# Patient Record
Sex: Female | Born: 1940 | Race: White | Hispanic: No | Smoking: Former smoker
Health system: Southern US, Community
[De-identification: ages and names within clinical notes are randomized; demographics above are authoritative.]

## PROBLEM LIST (undated history)

## (undated) DIAGNOSIS — H9193 Unspecified hearing loss, bilateral: Secondary | ICD-10-CM

## (undated) DIAGNOSIS — K2970 Gastritis, unspecified, without bleeding: Secondary | ICD-10-CM

## (undated) DIAGNOSIS — R112 Nausea with vomiting, unspecified: Secondary | ICD-10-CM

## (undated) DIAGNOSIS — N879 Dysplasia of cervix uteri, unspecified: Secondary | ICD-10-CM

## (undated) DIAGNOSIS — G251 Drug-induced tremor: Secondary | ICD-10-CM

## (undated) DIAGNOSIS — R251 Tremor, unspecified: Secondary | ICD-10-CM

## (undated) DIAGNOSIS — B9681 Helicobacter pylori [H. pylori] as the cause of diseases classified elsewhere: Secondary | ICD-10-CM

## (undated) DIAGNOSIS — E039 Hypothyroidism, unspecified: Secondary | ICD-10-CM

## (undated) DIAGNOSIS — I951 Orthostatic hypotension: Secondary | ICD-10-CM

## (undated) DIAGNOSIS — G232 Striatonigral degeneration: Secondary | ICD-10-CM

## (undated) DIAGNOSIS — M545 Low back pain, unspecified: Secondary | ICD-10-CM

## (undated) DIAGNOSIS — M1990 Unspecified osteoarthritis, unspecified site: Secondary | ICD-10-CM

## (undated) DIAGNOSIS — J329 Chronic sinusitis, unspecified: Secondary | ICD-10-CM

## (undated) DIAGNOSIS — M8580 Other specified disorders of bone density and structure, unspecified site: Secondary | ICD-10-CM

## (undated) DIAGNOSIS — I4891 Unspecified atrial fibrillation: Secondary | ICD-10-CM

## (undated) DIAGNOSIS — F419 Anxiety disorder, unspecified: Secondary | ICD-10-CM

## (undated) DIAGNOSIS — T43505A Adverse effect of unspecified antipsychotics and neuroleptics, initial encounter: Principal | ICD-10-CM

## (undated) DIAGNOSIS — G903 Multi-system degeneration of the autonomic nervous system: Secondary | ICD-10-CM

## (undated) DIAGNOSIS — S2249XA Multiple fractures of ribs, unspecified side, initial encounter for closed fracture: Secondary | ICD-10-CM

## (undated) DIAGNOSIS — S2239XA Fracture of one rib, unspecified side, initial encounter for closed fracture: Secondary | ICD-10-CM

## (undated) DIAGNOSIS — I499 Cardiac arrhythmia, unspecified: Secondary | ICD-10-CM

## (undated) DIAGNOSIS — C801 Malignant (primary) neoplasm, unspecified: Secondary | ICD-10-CM

## (undated) DIAGNOSIS — H9190 Unspecified hearing loss, unspecified ear: Secondary | ICD-10-CM

## (undated) DIAGNOSIS — Z9889 Other specified postprocedural states: Secondary | ICD-10-CM

## (undated) DIAGNOSIS — N83209 Unspecified ovarian cyst, unspecified side: Secondary | ICD-10-CM

## (undated) DIAGNOSIS — F32A Depression, unspecified: Secondary | ICD-10-CM

## (undated) DIAGNOSIS — J309 Allergic rhinitis, unspecified: Secondary | ICD-10-CM

## (undated) DIAGNOSIS — M7741 Metatarsalgia, right foot: Secondary | ICD-10-CM

## (undated) DIAGNOSIS — G238 Other specified degenerative diseases of basal ganglia: Secondary | ICD-10-CM

## (undated) DIAGNOSIS — E079 Disorder of thyroid, unspecified: Secondary | ICD-10-CM

## (undated) DIAGNOSIS — F329 Major depressive disorder, single episode, unspecified: Secondary | ICD-10-CM

## (undated) HISTORY — DX: Multiple fractures of ribs, unspecified side, initial encounter for closed fracture: S22.49XA

## (undated) HISTORY — PX: BACK SURGERY: SHX140

## (undated) HISTORY — DX: Dysplasia of cervix uteri, unspecified: N87.9

## (undated) HISTORY — DX: Drug-induced tremor: G25.1

## (undated) HISTORY — PX: BREAST ENHANCEMENT SURGERY: SHX7

## (undated) HISTORY — DX: Orthostatic hypotension: I95.1

## (undated) HISTORY — DX: Adverse effect of unspecified antipsychotics and neuroleptics, initial encounter: T43.505A

## (undated) HISTORY — PX: TOE SURGERY: SHX1073

## (undated) HISTORY — PX: HAMMER TOE SURGERY: SHX385

## (undated) HISTORY — DX: Other specified disorders of bone density and structure, unspecified site: M85.80

## (undated) HISTORY — DX: Metatarsalgia, right foot: M77.41

## (undated) HISTORY — PX: HERNIA REPAIR: SHX51

## (undated) HISTORY — DX: Unspecified ovarian cyst, unspecified side: N83.209

## (undated) HISTORY — DX: Other specified degenerative diseases of basal ganglia: G23.8

## (undated) HISTORY — PX: ABDOMINAL HYSTERECTOMY: SHX81

## (undated) HISTORY — DX: Unspecified hearing loss, bilateral: H91.93

## (undated) HISTORY — DX: Multi-system degeneration of the autonomic nervous system: G90.3

## (undated) HISTORY — DX: Unspecified atrial fibrillation: I48.91

## (undated) HISTORY — DX: Allergic rhinitis, unspecified: J30.9

## (undated) HISTORY — DX: Gastritis, unspecified, without bleeding: K29.70

## (undated) HISTORY — PX: GANGLION CYST EXCISION: SHX1691

## (undated) HISTORY — PX: OTHER SURGICAL HISTORY: SHX169

## (undated) HISTORY — DX: Low back pain: M54.5

## (undated) HISTORY — DX: Low back pain, unspecified: M54.50

## (undated) HISTORY — DX: Striatonigral degeneration: G23.2

## (undated) HISTORY — DX: Helicobacter pylori (H. pylori) as the cause of diseases classified elsewhere: B96.81

## (undated) HISTORY — DX: Fracture of one rib, unspecified side, initial encounter for closed fracture: S22.39XA

## (undated) HISTORY — DX: Disorder of thyroid, unspecified: E07.9

## (undated) HISTORY — DX: Unspecified hearing loss, unspecified ear: H91.90

## (undated) HISTORY — PX: FOOT SURGERY: SHX648

## (undated) HISTORY — DX: Chronic sinusitis, unspecified: J32.9

## (undated) HISTORY — PX: TONSILLECTOMY: SUR1361

---

## 1997-10-14 ENCOUNTER — Encounter: Admission: RE | Admit: 1997-10-14 | Discharge: 1998-01-12 | Payer: Self-pay | Admitting: Neurosurgery

## 1998-11-09 ENCOUNTER — Other Ambulatory Visit: Admission: RE | Admit: 1998-11-09 | Discharge: 1998-11-09 | Payer: Self-pay | Admitting: *Deleted

## 1998-11-29 ENCOUNTER — Inpatient Hospital Stay (HOSPITAL_COMMUNITY): Admission: RE | Admit: 1998-11-29 | Discharge: 1998-11-30 | Payer: Self-pay | Admitting: *Deleted

## 1999-10-16 ENCOUNTER — Other Ambulatory Visit: Admission: RE | Admit: 1999-10-16 | Discharge: 1999-10-16 | Payer: Self-pay | Admitting: Orthopedic Surgery

## 1999-12-22 ENCOUNTER — Emergency Department (HOSPITAL_COMMUNITY): Admission: EM | Admit: 1999-12-22 | Discharge: 1999-12-22 | Payer: Self-pay | Admitting: Emergency Medicine

## 2000-08-22 ENCOUNTER — Other Ambulatory Visit: Admission: RE | Admit: 2000-08-22 | Discharge: 2000-08-22 | Payer: Self-pay | Admitting: *Deleted

## 2000-12-08 ENCOUNTER — Emergency Department (HOSPITAL_COMMUNITY): Admission: EM | Admit: 2000-12-08 | Discharge: 2000-12-08 | Payer: Self-pay | Admitting: Emergency Medicine

## 2000-12-09 ENCOUNTER — Emergency Department (HOSPITAL_COMMUNITY): Admission: EM | Admit: 2000-12-09 | Discharge: 2000-12-09 | Payer: Self-pay | Admitting: Internal Medicine

## 2000-12-11 ENCOUNTER — Ambulatory Visit (HOSPITAL_COMMUNITY): Admission: RE | Admit: 2000-12-11 | Discharge: 2000-12-11 | Payer: Self-pay | Admitting: *Deleted

## 2002-11-06 ENCOUNTER — Encounter: Admission: RE | Admit: 2002-11-06 | Discharge: 2002-11-06 | Payer: Self-pay | Admitting: *Deleted

## 2002-11-06 ENCOUNTER — Encounter: Payer: Self-pay | Admitting: *Deleted

## 2002-11-13 ENCOUNTER — Encounter: Payer: Self-pay | Admitting: *Deleted

## 2002-11-13 ENCOUNTER — Encounter: Admission: RE | Admit: 2002-11-13 | Discharge: 2002-11-13 | Payer: Self-pay | Admitting: *Deleted

## 2003-08-16 ENCOUNTER — Inpatient Hospital Stay (HOSPITAL_COMMUNITY): Admission: EM | Admit: 2003-08-16 | Discharge: 2003-08-21 | Payer: Self-pay | Admitting: Psychiatry

## 2003-08-24 ENCOUNTER — Inpatient Hospital Stay (HOSPITAL_COMMUNITY): Admission: EM | Admit: 2003-08-24 | Discharge: 2003-09-05 | Payer: Self-pay | Admitting: Psychiatry

## 2003-09-20 ENCOUNTER — Inpatient Hospital Stay (HOSPITAL_COMMUNITY): Admission: EM | Admit: 2003-09-20 | Discharge: 2003-09-22 | Payer: Self-pay | Admitting: Psychiatry

## 2003-09-23 ENCOUNTER — Observation Stay (HOSPITAL_COMMUNITY): Admission: AD | Admit: 2003-09-23 | Discharge: 2003-09-24 | Payer: Self-pay | Admitting: Internal Medicine

## 2003-09-30 ENCOUNTER — Inpatient Hospital Stay (HOSPITAL_COMMUNITY): Admission: AD | Admit: 2003-09-30 | Discharge: 2003-10-08 | Payer: Self-pay | Admitting: Psychiatry

## 2003-10-11 ENCOUNTER — Other Ambulatory Visit (HOSPITAL_COMMUNITY): Admission: RE | Admit: 2003-10-11 | Discharge: 2003-10-26 | Payer: Self-pay | Admitting: Psychiatry

## 2003-11-24 ENCOUNTER — Other Ambulatory Visit (HOSPITAL_COMMUNITY): Admission: RE | Admit: 2003-11-24 | Discharge: 2003-12-10 | Payer: Self-pay | Admitting: Psychiatry

## 2004-03-30 ENCOUNTER — Ambulatory Visit: Payer: Self-pay | Admitting: Psychiatry

## 2004-03-30 ENCOUNTER — Inpatient Hospital Stay (HOSPITAL_COMMUNITY): Admission: RE | Admit: 2004-03-30 | Discharge: 2004-04-12 | Payer: Self-pay | Admitting: Psychiatry

## 2004-03-31 ENCOUNTER — Encounter: Payer: Self-pay | Admitting: Psychiatry

## 2004-04-13 ENCOUNTER — Other Ambulatory Visit (HOSPITAL_COMMUNITY): Admission: RE | Admit: 2004-04-13 | Discharge: 2004-07-12 | Payer: Self-pay | Admitting: Psychiatry

## 2004-05-16 ENCOUNTER — Other Ambulatory Visit: Admission: RE | Admit: 2004-05-16 | Discharge: 2004-05-16 | Payer: Self-pay | Admitting: Internal Medicine

## 2006-03-13 ENCOUNTER — Encounter: Admission: RE | Admit: 2006-03-13 | Discharge: 2006-03-13 | Payer: Self-pay | Admitting: Psychiatry

## 2006-11-13 ENCOUNTER — Observation Stay (HOSPITAL_COMMUNITY): Admission: AD | Admit: 2006-11-13 | Discharge: 2006-11-14 | Payer: Self-pay | Admitting: Internal Medicine

## 2007-10-14 ENCOUNTER — Emergency Department (HOSPITAL_COMMUNITY): Admission: EM | Admit: 2007-10-14 | Discharge: 2007-10-14 | Payer: Self-pay | Admitting: Emergency Medicine

## 2009-09-06 ENCOUNTER — Emergency Department (HOSPITAL_COMMUNITY)
Admission: EM | Admit: 2009-09-06 | Discharge: 2009-09-06 | Payer: Self-pay | Source: Home / Self Care | Admitting: Emergency Medicine

## 2009-11-10 ENCOUNTER — Ambulatory Visit (HOSPITAL_COMMUNITY): Admission: EM | Admit: 2009-11-10 | Discharge: 2009-11-10 | Payer: Self-pay | Admitting: Emergency Medicine

## 2009-11-10 ENCOUNTER — Ambulatory Visit: Payer: Self-pay | Admitting: Gastroenterology

## 2009-11-16 ENCOUNTER — Telehealth (INDEPENDENT_AMBULATORY_CARE_PROVIDER_SITE_OTHER): Payer: Self-pay | Admitting: *Deleted

## 2009-12-09 ENCOUNTER — Encounter (INDEPENDENT_AMBULATORY_CARE_PROVIDER_SITE_OTHER): Payer: Self-pay | Admitting: *Deleted

## 2009-12-09 ENCOUNTER — Ambulatory Visit: Payer: Self-pay | Admitting: Gastroenterology

## 2009-12-09 DIAGNOSIS — F329 Major depressive disorder, single episode, unspecified: Secondary | ICD-10-CM

## 2009-12-09 DIAGNOSIS — F3289 Other specified depressive episodes: Secondary | ICD-10-CM | POA: Insufficient documentation

## 2009-12-09 DIAGNOSIS — K222 Esophageal obstruction: Secondary | ICD-10-CM | POA: Insufficient documentation

## 2009-12-09 DIAGNOSIS — R1310 Dysphagia, unspecified: Secondary | ICD-10-CM | POA: Insufficient documentation

## 2009-12-09 LAB — CONVERTED CEMR LAB
AST: 23 units/L (ref 0–37)
Alkaline Phosphatase: 63 units/L (ref 39–117)
Basophils Absolute: 0.1 10*3/uL (ref 0.0–0.1)
Bilirubin, Direct: 0 mg/dL (ref 0.0–0.3)
Calcium: 9.6 mg/dL (ref 8.4–10.5)
Eosinophils Relative: 8.7 % — ABNORMAL HIGH (ref 0.0–5.0)
Ferritin: 28.8 ng/mL (ref 10.0–291.0)
Folate: 19.1 ng/mL
GFR calc non Af Amer: 56.1 mL/min (ref >60)
HCT: 41.5 % (ref 36.0–46.0)
Iron: 106 ug/dL (ref 42–145)
Lymphs Abs: 2.5 10*3/uL (ref 0.7–4.0)
MCV: 95.7 fL (ref 78.0–100.0)
Monocytes Absolute: 0.5 10*3/uL (ref 0.1–1.0)
Neutrophils Relative %: 49.5 % (ref 43.0–77.0)
Platelets: 289 10*3/uL (ref 150.0–400.0)
Potassium: 4.7 meq/L (ref 3.5–5.1)
RDW: 15 % — ABNORMAL HIGH (ref 11.5–14.6)
Saturation Ratios: 30.1 % (ref 20.0–50.0)
Sodium: 143 meq/L (ref 135–145)
Total Bilirubin: 0.4 mg/dL (ref 0.3–1.2)
Transferrin: 251.6 mg/dL (ref 212.0–360.0)
WBC: 7.4 10*3/uL (ref 4.5–10.5)

## 2009-12-23 ENCOUNTER — Ambulatory Visit: Payer: Self-pay | Admitting: Gastroenterology

## 2010-08-15 NOTE — Letter (Signed)
Summary: EGD Instructions  [facility]  [address] [place], [place] [address]   Phone: [phone]  Fax: [phone]       Melinda [name]    [birth year]    MRN: [id number]       Procedure Day /Date: Friday, 12/23/09     Arrival Time:  2:00     Procedure Time: 3:00     Location of Procedure:                    [facility] (4th Floor)    PREPARATION FOR ENDOSCOPY   On 12/23/09 THE DAY OF THE PROCEDURE:  1.   No solid foods, milk or milk products are allowed after midnight the night before your procedure.  2.   Do not drink anything colored red or purple.  Avoid juices with pulp.  No orange juice.  3.  You may drink clear liquids until 1;00, which is 2 hours before your procedure.                                                                                                CLEAR LIQUIDS INCLUDE: Water Jello Ice Popsicles Tea (sugar ok, no milk/cream) Powdered fruit flavored drinks Coffee (sugar ok, no milk/cream) Gatorade Juice: apple, white grape, white cranberry  Lemonade Clear bullion, consomm, broth Carbonated beverages (any kind) Strained chicken noodle soup Hard Candy   MEDICATION INSTRUCTIONS  Unless otherwise instructed, you should take regular prescription medications with a small sip of water as early as possible the morning of your procedure.                        OTHER INSTRUCTIONS  You will need a responsible adult at least 69 years of age to accompany you and drive you home.   This person must remain in the waiting room during your procedure.  Wear loose fitting clothing that is easily removed.  Leave jewelry and other valuables at home.  However, you may wish to bring a book to read or an iPod/MP3 player to listen to music as you wait for your procedure to start.  Remove all body piercing jewelry and leave at home.  Total time from sign-in until discharge is approximately 2-3 hours.  You should  go home directly after your procedure and rest.  You can resume normal activities the day after your procedure.  The day of your procedure you should not:   Drive   Make legal decisions   Operate machinery   Drink alcohol   Return to work  You will receive specific instructions about eating, activities and medications before you leave.    The above instructions have been reviewed and explained to me by   _______________________    I fully understand and can verbalize these instructions _____________________________ Date _________

## 2010-08-15 NOTE — Procedures (Addendum)
Summary: Upper Endoscopy  Patient: Melinda [name] Note: All result statuses are Final unless otherwise noted.  Tests: (1) Upper Endoscopy (EGD)   EGD Upper Endoscopy       DONE     [facility]     [address]     [place], [place]  [address]           ENDOSCOPY PROCEDURE REPORT           PATIENT:  Melinda [name], Melinda [name]  MR#:  [id number]     BIRTHDATE:  10-18-1940, 68 yrs. old  GENDER:  female           ENDOSCOPIST:  Vania Rea. Jarold Motto, MD, [facility]     Referred by:  Barbette Hair. Arlyce Dice, M.D.           PROCEDURE DATE:  12/23/2009     PROCEDURE:  Elease Hashimoto Dilation of Esophagus, EGD, diagnostic     ASA CLASS:  Class III     INDICATIONS:  dysphagia           MEDICATIONS:   Fentanyl 75 mcg IV, Versed 9 mg IV     TOPICAL ANESTHETIC:  Exactacain Spray           DESCRIPTION OF PROCEDURE:   After the risks benefits and     alternatives of the procedure were thoroughly explained, informed     consent was obtained.  The LB GIF-H180 [id number] endoscope was     introduced through the mouth and advanced to the second portion of     the duodenum, without limitations.  The instrument was slowly     withdrawn as the mucosa was fully examined.     <<PROCEDUREIMAGES>>           A stricture was found in the proximal esophagus. "INLET POUCH "IN     UPPER ESOPHAGUS WITH ASSOCIATED STRICTURE DILATED # 48 F MALONEY     DILATOR.NO HEME OR PAIN.  There were mucosal changes in the     proximal esophagus consistent with a thoracic inlet patch.  Normal     GE junction was noted. DILATED AND ATONIC ESOPHAGUS.NO FOREIGN     BODY.  Normal duodenal folds were noted.    Retroflexed views     revealed no abnormalities.    The scope was then withdrawn from     the patient and the procedure completed.           COMPLICATIONS:  None           ENDOSCOPIC IMPRESSION:     1) Stricture in the proximal esophagus     2) Thoracic inlet patch     3) Normal GE junction     4) Normal duodenal folds     "INLET POUCH" AND  SECONDARY UPPER ESOPHAGEAL STRICTURE DILATED.           RECOMMENDATIONS:     1) Clear liquids until, then soft foods rest iof day. Resume     prior diet tomorrow.     2) continue current medications     CONSIDER MANOMETRY IF SYMPTOMS PERSIST.           REPEAT EXAM:  No           ______________________________     Vania Rea. Jarold Motto, MD, [facility]           CC:  Kirby Funk, MD           n.     eSIGNED:  Vania Rea. Clenton Esper at 12/23/2009 03:38 PM           Sherre Lain, [id number]  Note: An exclamation mark (!) indicates a result that was not dispersed into the flowsheet. Document Creation Date: 12/23/2009 3:39 PM _______________________________________________________________________  (1) Order result status: Final Collection or observation date-time: 12/23/2009 15:26 Requested date-time:  Receipt date-time:  Reported date-time:  Referring Physician:   Ordering Physician: Sheryn Bison [phone]) Specimen Source:  Source: Launa Grill Order Number: [id number] Lab site:   Appended Document: Upper Endoscopy

## 2010-08-15 NOTE — Progress Notes (Signed)
Summary: Follow up appt  Phone Note Outgoing Call   Summary of Call: LM for pt to call re moving appt up.  Sch for OV 5/26/1,  We can see her sooner if she wants.  will need endo with dil.  See EGD report from 11/10/09.   dfs Initial call taken by: Ashok Cordia RN,  Nov 16, 2009 3:59 PM  Follow-up for Phone Call        Talked with pt.  She is going out of town for 3 weeks and can not come in any sooner.  Pt instructed to eat slowly and take small bites,  Chew food well.  Follow-up by: Ashok Cordia RN,  Nov 16, 2009 4:02 PM

## 2010-08-15 NOTE — Assessment & Plan Note (Signed)
Summary: dysphagia...as.   History of Present Illness Visit Type: new patient  Primary GI MD: Sheryn Bison MD FACP FAGA Primary Provider: Kirby Funk, MD  Requesting Provider: na Chief Complaint: dysphagia  History of Present Illness:   70 year old Caucasian female with severe chronic depression on multiple psychotropic medications. Apparently she had been a patient of mine in the past although we do not have an old chart for review. She recently was seen in [facility] with a solid food impaction and had emergency endoscopy by Dr. Arlyce Dice one month ago. At that time she had a proximal esophageal stricture which was nondilated. She continues with solid food dysphagia, and is on a soft mechanical diet.  She denies any reflux symptoms whatsoever. She apparently is on lansoprazole 30 mg a day empirically. She denies any other gastrointestinal symptoms her about irregularity, melena or hematochezia, or change in bowel habits. She has not had previous colonoscopy.  The patient relates that she's had dysphagia intermittently for the last 40 years and has had multiple dilations. Again these records are not available for review. She has possible psychotic depression and is on multiple psychotropic medications listed and reviewed her chart. Her primary care physician is Dr. Danae Chen. Other diagnoses include hypothyroidism, spinal stenosis, and migraine headaches, with previous total abdominal hysterectomy and removal of her ovaries. She does not smoke or abuse ethanol allegedly.   GI Review of Systems    Reports dysphagia with solids.      Denies abdominal pain, acid reflux, belching, bloating, chest pain, dysphagia with liquids, heartburn, loss of appetite, nausea, vomiting, vomiting blood, weight loss, and  weight gain.        Denies anal fissure, black tarry stools, change in bowel habit, constipation, diarrhea, diverticulosis, fecal incontinence, heme positive stool,  hemorrhoids, irritable bowel syndrome, jaundice, light color stool, liver problems, rectal bleeding, and  rectal pain.    Current Medications (verified): 1)  Lamotrigine 100 Mg Tabs (Lamotrigine) .... One Tablet By Mouth Once Daily 2)  Budeprion Xl 300 Mg Xr24h-Tab (Bupropion Hcl) .... One Tablet By Mouth Once Daily 3)  Paroxetine Hcl 20 Mg Tabs (Paroxetine Hcl) .... One Tablet By Mouth Two Times A Day 4)  Fludrocortisone Acetate 0.1 Mg Tabs (Fludrocortisone Acetate) .... One Tablet By Mouth Once Daily 5)  Midodrine Hcl 10 Mg Tabs (Midodrine Hcl) .... One Tablet By Mouth Three Times A Day 6)  Estradiol 1 Mg Tabs (Estradiol) .... One Tablet By Mouth Once Daily 7)  Levothyroxine Sodium 75 Mcg Tabs (Levothyroxine Sodium) .... One Tablet By Mouth Once Daily 8)  Lansoprazole 30 Mg Cpdr (Lansoprazole) .... One Tablet By Mouth Once Daily 9)  Zyprexa 5 Mg Tabs (Olanzapine) .... One Tablet By Mouth Once Daily 10)  Geodon 40 Mg Caps (Ziprasidone Hcl) .... One Tablet By Mouth Two Times A Day 11)  Clonazepam 0.5 Mg Tabs (Clonazepam) .... One Tablet By Mouth Three Times A Day At Bedtime  Allergies (verified): No Known Drug Allergies  Past History:  Past medical, surgical, family and social histories (including risk factors) reviewed for relevance to current acute and chronic problems.  Past Medical History: Depression Hypothyroidism Chronic Back pain Spinal Stenosis Chronic Headaches Urinary Tract Infection Peptic Ulcer Disease  Past Surgical History: Bladder Repair Hysterectomy Tonsillectomy Back Surgery   Family History: Reviewed history and no changes required. No FH of Colon Cancer:  Social History: Reviewed history from 12/05/2009 and no changes required. Retired Chief Strategy Officer  Patient is a former  smoker.  Alcohol Use - no Daily Caffeine Use: 4 cups daily  Illicit Drug Use - no Smoking Status:  quit  Review of Systems       The patient complains of  allergy/sinus, anxiety-new, depression-new, headaches-new, hearing problems, and shortness of breath.  The patient denies anemia, arthritis/joint pain, back pain, blood in urine, breast changes/lumps, change in vision, confusion, cough, coughing up blood, fainting, fatigue, fever, heart murmur, heart rhythm changes, itching, menstrual pain, muscle pains/cramps, night sweats, nosebleeds, pregnancy symptoms, skin rash, sleeping problems, sore throat, swelling of feet/legs, swollen lymph glands, thirst - excessive , urination - excessive , urination changes/pain, urine leakage, vision changes, and voice change.   General:  Complains of fatigue; denies fever, chills, sweats, anorexia, weakness, malaise, weight loss, and sleep disorder. Eyes:  Denies blurring, diplopia, irritation, discharge, vision loss, scotoma, eye pain, and photophobia. ENT:  Complains of decreased hearing; denies earache, ear discharge, tinnitus, nasal congestion, loss of smell, nosebleeds, sore throat, hoarseness, and difficulty swallowing; She has urinate and apparently his death.. Resp:  Complains of dyspnea with exercise; denies dyspnea at rest, cough, sputum, wheezing, coughing up blood, and pleurisy. GI:  Complains of difficulty swallowing; denies pain on swallowing, nausea, indigestion/heartburn, vomiting, vomiting blood, abdominal pain, jaundice, gas/bloating, diarrhea, constipation, change in bowel habits, bloody BM's, black BMs, and fecal incontinence. GU:  Denies urinary burning, blood in urine, nocturnal urination, urinary frequency, urinary incontinence, abnormal vaginal bleeding, amenorrhea, menorrhagia, vaginal discharge, pelvic pain, genital sores, painful intercourse, and decreased libido. MS:  Denies joint pain / LOM, joint swelling, joint stiffness, joint deformity, low back pain, muscle weakness, muscle cramps, muscle atrophy, leg pain at night, leg pain with exertion, and shoulder pain / LOM hand / wrist pain  (CTS). Neuro:  Complains of frequent headaches; denies weakness, paralysis, abnormal sensation, seizures, syncope, tremors, vertigo, transient blindness, frequent falls, difficulty walking, headache, sciatica, radiculopathy other:, restless legs, memory loss, and confusion. Psych:  Complains of depression, anxiety, and confusion; denies memory loss, suicidal ideation, hallucinations, paranoia, and phobia; severe depression with multiple psychotropic medications.. Endo:  Denies cold intolerance, heat intolerance, polydipsia, polyphagia, polyuria, unusual weight change, and hirsutism. Allergy:  Complains of hay fever; denies hives, rash, sneezing, and recurrent infections.  Vital Signs:  Patient profile:   70 year old female Height:      69 inches Weight:      177 pounds BMI:     26.23 BSA:     1.96 Pulse rate:   60 / minute Pulse rhythm:   regular BP sitting:   106 / 74  (left arm) Cuff size:   regular  Vitals Entered By: Ok Anis CMA (Dec 09, 2009 8:42 AM)  Physical Exam  General:  Well developed, well nourished, no acute distress.Blank bases with rather marked resting tremor noted. Head:  Normocephalic and atraumatic. Eyes:  PERRLA, no icterus.exam deferred to patient's ophthalmologist.   Mouth:  No deformity or lesions, dentition normal. Neck:  Supple; no masses or thyromegaly. Lungs:  Clear throughout to auscultation. Heart:  Regular rate and rhythm; no murmurs, rubs,  or bruits. Abdomen:  Soft, nontender and nondistended. No masses, hepatosplenomegaly or hernias noted. Normal bowel sounds. Rectal:  deferred until time of colonoscopy.   Msk:  Symmetrical with no gross deformities. Normal posture. Pulses:  Normal pulses noted. Extremities:  No clubbing, cyanosis, edema or deformities noted.trace pedal edema.   Neurologic:  Alert and  oriented x4;  grossly normal neurologically. Cervical Nodes:  No significant cervical adenopathy.  Psych:  Alert and cooperative. Normal mood and  affect.poor concentration.     Impression & Recommendations:  Problem # 1:  ESOPHAGEAL STRICTURE (ICD-530.3) Assessment Unchanged Proximal esophageal stricture of unexplained etiology-consider Inlet Pouch with heterotropic gastric mucosa in the proximal esophagus, congenital esophageal web syndrome,Plummer-Vinson syndrome associated with iron deficiency, eosinophilic esophagitis, malignancy, versus scarring from previous pill esophagitis. She certainly has no symptoms of chronic acid reflux disease. I have scheduled her for repeat endoscopy with biopsies and gentle dilation as needed. She seems to understand the risk and benefits of this procedure, and I have reviewed this with her several times and given her prior information concerning endoscopy and esophageal dilatation TLB-CBC Platelet - w/Differential (85025-CBCD) TLB-BMP (Basic Metabolic Panel-BMET) (80048-METABOL) TLB-Hepatic/Liver Function Pnl (80076-HEPATIC) TLB-TSH (Thyroid Stimulating Hormone) (84443-TSH) TLB-B12, Serum-Total ONLY ([address]-U04) TLB-Ferritin (82728-FER) TLB-Folic Acid (Folate) (82746-FOL) TLB-IBC Pnl (Iron/FE;Transferrin) (83550-IBC)  Problem # 2:  DYSPHAGIA UNSPECIFIED (ICD-787.20) Assessment: Unchanged  Orders: TLB-CBC Platelet - w/Differential (85025-CBCD) TLB-BMP (Basic Metabolic Panel-BMET) (80048-METABOL) TLB-Hepatic/Liver Function Pnl (80076-HEPATIC) TLB-TSH (Thyroid Stimulating Hormone) (84443-TSH) TLB-B12, Serum-Total ONLY (54098-J19) TLB-Ferritin (82728-FER) TLB-Folic Acid (Folate) (82746-FOL) TLB-IBC Pnl (Iron/FE;Transferrin) (83550-IBC)  Problem # 3:  DEPRESSION (ICD-311) Assessment: Unchanged Possible psychotic depression with multiple medications. The patient has obvious drug side effects visible physical exam. I do not have any records concerning her psychiatric or medical history otherwise. She seemed to tolerate conscious sedation with recent endoscopy fairly well. We will request further  records from Dr. Kirby Funk.  Problem # 4:  SCREENING COLORECTAL-CANCER (ICD-V76.51) Assessment: Unchanged After her swallowing problems are addressed, she does need screening colonoscopy for her age. I am not aware that this is ever been accomplished.  Patient Instructions: 1)  Please go to the basement for lab work. 2)  You are scheduled for an upper endoscopy. 3)  The medication list was reviewed and reconciled.  All changed / newly prescribed medications were explained.  A complete medication list was provided to the patient / caregiver. 4)  Copy sent to : Dr. Kirby Funk... 5)  Please continue current medications.  6)  Conscious Sedation brochure given.  7)  Upper Endoscopy with Dilatation brochure given.  8)  Outside Records Requested from primary care for review. Also her old chart from our office has been requested.  Appended Document: dysphagia...as.    Clinical Lists Changes  Orders: Added new Test order of EGD SAV (EGD SAV) - Signed

## 2010-08-15 NOTE — Procedures (Signed)
Summary: Upper Endoscopy  Patient: Melinda [name] Note: All result statuses are Final unless otherwise noted.  Tests: (1) Upper Endoscopy (EGD)   EGD Upper Endoscopy       DONE     [facility]     [address] [place], [place]  [address]           ENDOSCOPY PROCEDURE REPORT           PATIENT:  [name], [name]  MR#:  [id number]     BIRTHDATE:  April 01, 1941, 68 yrs. old  GENDER:  female           ENDOSCOPIST:  Barbette Hair. Arlyce Dice, MD     Referred by:           PROCEDURE DATE:  11/10/2009     PROCEDURE:  EGD with foreign body removal     ASA CLASS:  Class II     INDICATIONS:  food impaction           MEDICATIONS:   Fentanyl 75 mcg, Versed 8 mg, Benadryl 25 mg     TOPICAL ANESTHETIC:  Cetacaine Spray           DESCRIPTION OF PROCEDURE:   After the risks benefits and     alternatives of the procedure were thoroughly explained, informed     consent was obtained.  The  endoscope was introduced through the     mouth and advanced to the third portion of the duodenum, without     limitations.  The instrument was slowly withdrawn as the mucosa     was fully examined.     <<PROCEDUREIMAGES>>           meat impaction in the proximal esophagus. Food impaction just     beyond the UES (see image1). Food was gently pushed beyond a tight     stricture at 22cm into the distal esophagus and stomach  A     stricture was found in the proximal esophagus. Tight stricture at     22cm from incisors (see image2 and image3). 10mm scope passed with     resistance  other findings.    Retroflexed views revealed no     abnormalities.    The scope was then withdrawn from the patient     and the procedure completed.           COMPLICATIONS:  None           ENDOSCOPIC IMPRESSION:     1) Meat impaction in the proximal esophagus     2) Stricture in the proximal esophagus           RECOMMENDATIONS:     1) Prevacid 30 mg qd           REPEAT EXAM:  In 2 weeks  for EGD/balloon dilitation with  Dr.     Jarold Motto           ______________________________     Barbette Hair. Arlyce Dice, MD           CC:  Kirby Funk, MD           n.     Rosalie Doctor:   Barbette Hair. Karyss Frese at 11/10/2009 10:08 PM           Sherre Lain, [id number]  Note: An exclamation mark (!) indicates a result that was not dispersed into the flowsheet. Document Creation Date: 11/10/2009 10:08 PM _______________________________________________________________________  (1) Order result status: Final Collection or  observation date-time: 11/10/2009 22:03 Requested date-time:  Receipt date-time:  Reported date-time:  Referring Physician:   Ordering Physician: Melvia Heaps [phone]) Specimen Source:  Source: Launa Grill Order Number: [id number] Lab site:

## 2010-09-13 ENCOUNTER — Encounter: Payer: Self-pay | Admitting: Gastroenterology

## 2010-09-21 NOTE — Miscellaneous (Signed)
Summary: Change in PPI  Changed PPI due to insurance. they will not cover prevacid until she has failed omeprazole and Nexium.  Clinical Lists Changes  Medications: Changed medication from LANSOPRAZOLE 30 MG CPDR (LANSOPRAZOLE) one tablet by mouth once daily to NEXIUM 40 MG CPDR (ESOMEPRAZOLE MAGNESIUM) take one by mouth once daily - Signed Rx of NEXIUM 40 MG CPDR (ESOMEPRAZOLE MAGNESIUM) take one by mouth once daily;  #30 x 6;  Signed;  Entered by: Harlow Mares CMA (AAMA);  Authorized by: Mardella Layman MD [facility];  Method used: Electronically to [facility]. #[id number]*, [address] [place], [place], [place]  [address], Ph: [phone], Fax: [phone]    Prescriptions: NEXIUM 40 MG CPDR (ESOMEPRAZOLE MAGNESIUM) take one by mouth once daily  #30 x 6   Entered by:   Harlow Mares CMA (AAMA)   Authorized by:   Mardella Layman MD [facility]   Signed by:   Harlow Mares CMA (AAMA) on 09/13/2010   Method used:   Electronically to        [facility]. [phone]* (retail)       [address]       [place], [place]  [address]       Ph: [phone]       Fax: [phone]   RxID:   [id number]

## 2010-10-03 LAB — POCT I-STAT, CHEM 8
BUN: 50 mg/dL — ABNORMAL HIGH (ref 6–23)
Calcium, Ion: 1.14 mmol/L (ref 1.12–1.32)
Hemoglobin: 14.6 g/dL (ref 12.0–15.0)
Sodium: 141 mEq/L (ref 135–145)
TCO2: 26 mmol/L (ref 0–100)

## 2010-11-28 NOTE — Op Note (Signed)
Melinda [name], Melinda [name]               ACCOUNT NO.:  [id number]   MEDICAL RECORD NO.:  [id number]          PATIENT TYPE:  EMS   LOCATION:  ED                           FACILITY:  [facility]   PHYSICIAN:  Petra Kuba, M.D.    DATE OF BIRTH:  1941/07/11   DATE OF PROCEDURE:  10/14/2007  DATE OF DISCHARGE:  10/14/2007                               OPERATIVE REPORT   PROCEDURE:  Esophagogastroduodenoscopy with pill removal.   INDICATIONS:  Pill stuck in the esophagus.  Consent was signed after  risks, benefits, methods, and options were thoroughly discussed by Dr.  Radford Pax and myself prior to any sedation.   MEDICINES USED:  Fentanyl 100 mcg and Versed 12.5 mg.   PROCEDURE IN DETAIL:  The video endoscope was inserted by direct vision.  A quick look at the posterior pharynx was normal. As soon as we advanced  into the upper esophagus, the pill was seen.  We advanced the smaller of  the two Rothnets and grasped the majority of the pill.  It did break the  pill in half and some of it was removed. The scope was then reinserted  and advancing the net gently and trying to get around the remainder of  the pill, the pill did partially dissolve and it pushed into the mid and  distal part of the esophagus. There was an obvious upper esophageal  fibrous ring which with advancing the scope did cause very minimal  trauma.  The rest of the esophagus was normal.  The pill fragments left  in the esophagus were easily pushed into the stomach.  There was some  old food in the stomach.  We quickly advanced through a normal antrum,  normal pylorus, into a normal duodenal bulb and elected to stop  advancing at that juncture. The stomach was evaluated on straight  visualization only. Based on the food in the stomach, I did not want to  make her retch.  Air was suctioned, the scope slowly withdrawn, and a  good look at the esophagus showed it to be patent.  There was no signs  of active bleeding or significant  trauma to the ring. The scope was  removed.  The patient tolerated the procedure well.  There was no  obvious immediate complication.   ENDOSCOPIC DIAGNOSES:  1. Pill in the absolute upper esophagus status post Rothnet      advancement x2 which broke and removed some of the pill and then      the rest easily pushed into the stomach.  2. Upper esophageal ring with minimal trauma with passage of the      scope.  3. Increased food in the stomach.  4. Otherwise, within normal limits to the duodenal bulb.   PLAN:  Clear liquids and soft diet, slowly advance. Follow-up in 2-4  weeks to discuss questionable dilation and also screening colonoscopy  which she is well overdue for which we could probably do at the same  time. Have her call us sooner p.r.n.           ______________________________  Lavada Mesi.  Magod, M.D.     MEM/MEDQ  D:  10/14/2007  T:  10/14/2007  Job:  [id number]   cc:   Nelva Nay, MD  Fax: [phone]   Thora Lance, M.D.  Fax: [phone]

## 2010-11-28 NOTE — Op Note (Signed)
Melinda [name], Melinda [name]               ACCOUNT NO.:  [id number]   MEDICAL RECORD NO.:  [id number]          PATIENT TYPE:  EMS   LOCATION:  ED                           FACILITY:  [facility]   PHYSICIAN:  Petra Kuba, M.D.    DATE OF BIRTH:  1940-09-30   DATE OF PROCEDURE:  10/14/2007  DATE OF DISCHARGE:  10/14/2007                               OPERATIVE REPORT   GI CONSULTATION   HISTORY:  Patient presented to the ER with a probable food impaction.  It was actually probably a pill, according to her.  It seems to be  caught in her upper neck.  She has had this happen before with chicken,  but that seems to pass on its own.  She really has not had any other  swallowing issues.  She has never had an endoscopy, a colonoscopy, or a  barium swallow.   PAST MEDICAL HISTORY:  Pertinent for some depression, hypothyroidism,  and a recent bladder infection on the Bactrim.  She has one more day to  take as well as the hysterectomy.   FAMILY HISTORY:  Negative for any swallowing issues.   No known allergies.   CURRENT MEDICATIONS:  Bactrim and Paxil.   SOCIAL HISTORY:  No tobacco or alcohol.  She does live alone without any  aspirin, nonsteroidals.  Will use Tylenol only.   PHYSICAL EXAMINATION:  No acute distress but sitting up.  VITAL SIGNS:  Stable.  Afebrile.  Throat is clear.  NECK:  Supple without adenopathy.  LUNGS:  Clear.  Regular rate and rhythm.  ABDOMEN:  Soft, nontender.   ASSESSMENT:  Probable pill caught in the esophagus.   PLAN:  The risks, benefits, methods of EGD with removal were discussed.  Rarely will we push it down.  We will proceed ASAP with her permission  with further workup and plans pending those findings.           ______________________________  Petra Kuba, M.D.     MEM/MEDQ  D:  10/14/2007  T:  10/14/2007  Job:  [id number]   cc:   Thora Lance, M.D.  Fax: [phone]   Nelva Nay, MD  Fax: [phone]

## 2010-12-01 NOTE — Discharge Summary (Signed)
NAME:  Melinda [name], Melinda [name] NO.:  [id number]   MEDICAL RECORD NO.:  [id number]          PATIENT TYPE:  IPS   LOCATION:  0300                          [facility]ACILITY[facility]  [facility]   PHYSICIAN:  Jeanice Lim, M.D. DATE OF BIRTH:  September 20, 1940   DATE OF ADMISSION:  03/30/2004  DATE OF DISCHARGE:  04/12/2004                                 DISCHARGE SUMMARY   IDENTIFYING DATA:  This is a 70 year old, divorced, Caucasian female,  voluntarily admitted, referred by psychiatrist for suicidal thoughts; just  wanting to die and wanting it to be all over.  Having thoughts of crashing  car while doing daily errands.  Overwhelmed with life.  Had become recluse,  isolating, not taking care of her household or ADLs.  Not having any hope  with multiple near vegetative symptoms of depression and some behavioral  depressive linked issues which appear to be complicating outpatient  treatment.   MEDICAL PROBLEMS:  Include a history of hypothyroidism and diarrhea on and  off whole life as per patient and hysterectomy for cervical cancer.   MEDICATIONS:  Esterase, Cymbalta, Lamictal, Neurontin, trazodone and  Zyprexa.  The patient was clearly not taking the medications in a fully  compliant manner, taking Neurontin at a fluctuating dose, taking sleeping  medicine in the past in the middle of the day to escape, be asleep and then  have disruptive sleep after this as well as taking a higher dose of Zyprexa  than had been recommended and continuing a higher dose of trazodone than  recommended due to past possible orthostatic hypotension and syncopal  episodes, which were evaluated by a primary care physician.   ALLERGIES:  PENICILLIN.   PHYSICAL EXAMINATION:  Physical and neurological exams essentially within  normal limits.   LABORATORY DATA:  Routine admission labs essentially within normal limits.   MENTAL STATUS EXAM:  Fully alert, psychomotor slowing.  Fair eye contact.  Speech slowed pace,  decreased tone, some response latency.  Mood depressed,  helpless, hopeless.  Thought process goal-directed.  Positive suicidal  ideation with plan.  Cognitively intact.  Some latency.  No acute intent and  no clear psychotic symptoms other than depressive egosyntonic distortions.   ADMITTING DIAGNOSES:   AXIS I:  1.  Major depressive disorder, recurrent, severe.  2.  Generalized anxiety disorder.  3.  Panic disorder without agoraphobia.  4.  History of alcohol dependence, in 11 year remission.  5.  History of opiate dependence in remission.   AXIS II:  Deferred.   AXIS III:  Hypothyroidism.   AXIS IV:  Severe - Lack of social support and financial stress.  Episodic  conflict with family.   AXIS V:  20/55.   HOSPITAL COURSE:  The patient was admitted and ordered routine p.r.n.  medications and underwent further monitoring.  Was encouraged to participate  in individual, group and milieu therapies.  The patient was placed on safety  checks and monitored regarding vital signs and medical monitoring to rule  out __________ medical etiology of psychopathology.  The nurse practitioner  contacted outpatient physician, Kirby Funk, M.D., primary  care physician  at [facility] regarding past diarrhea workup and orthostatic  hypotension and syncopal episodes to clarify all of the medical  recommendations and workups that have been done thus far and to continue  these while she is an inpatient for further medical stabilization.  The  patient's episodic compliance with medications and taking excessive medicine  at times clearly worsened her physical symptoms in addition to her not  eating consistently, being dehydrated.  Therefore medications were  simplified and the patient was ordered a nutrition consult and monitored  regarding eyes and nose, blood pressure and orthostatic changes and family  meeting was held to discuss in detail with family, especially daughter who  is  advocating for patient at this time, regarding patient's medical  conditions, need for compliance with follow-up and treatment  recommendations, further workup of blood pressure issues, which continued to  be low despite hydration and primary care physician recommended this be  treated since it continues to be low and orthostatic changes drop her to a  dangerous level where she may pass out or fall.  The patient reported a  positive suicidal ideation which gradually decreased and physical symptoms  which gradually improved.  Diarrhea resolved.  Blood pressure stabilized  with still orthostatic changes, but not dropping to such a low level.  The  patient tolerated medication changes well and reported a slight improvement  in mood and future oriented, goal-directed with some hopefulness as well as  improved coping skills.  The patient was given education regarding the  importance of compliance with medications and a behavioral plan, which she  helped design, which she needed to stick to to be able to break some of the  depressive behaviors, which have become engrained and keep her from getting  better.  The option of ECT was reviewed.  The patient was educated as well  as family and patient was aware that this will be the recommendation if  patient fails outpatient treatment another time, whether there is a  component of behavioral symptoms and noncompliance present or not, to allow  aggressive treatment of her chronic depression.  The patient was discharged  in improved condition with less depressed mood, brighter affect, problem  solving, came up with her own relapse prevention plan and multiple things  that she could do to structure her time, become more active.   DISCHARGE MEDICATIONS:  She was given medication education and discharged  on:  1.  Imodium one in the morning.  2.  Levothyroxine 112 mcg in the morning.  3.  Cymbalta 60 mg in the morning. 4.  Estradiol 0.5 q.a.m.  5.   Claritin 10 mg q.a.m.  6.  Florinef (402) 0.05 q.a.m. which is to keep the blood pressure from      dropping.  7.  Serzone 100 mg one and a half q.h.s.  8.  At 11:00 a.m. Questran 4 grams mixed with 4 to 6 ounces of juice and at      9:30 p.m.  9.  Zyprexa 5 mg.  10. Neurontin 600 mg.  11. Florinef 0.05 one half or 0.025 at 9:30 p.m. also to keep blood pressure      stable.  12. Serzone 100 mg two and a half at 9:30 p.m.  13. Trazodone decreased all the way down to 50 mg to take one to two at 9:30      p.m., max. 100 mg due to a history of orthostatic hypotension.  14. Imodium  one tablet at 9:30 p.m. only if diarrhea or loose stools      continue during the day.  15. The patient was also given a recommendation to take Midrin for headache      if this occurs.   FOLLOW UP:  1.  With therapist, Dr. Willaim Sheng, who visited the patient and spoke with      the patient on multiple occasions while hospitalized.  This was      scheduled for Tuesday, October 18th at 11:00 a.m.  2.  Nutritional and diabetes outpatient follow-up for nutrition assessment      was set up on September 30th at 3:30 p.m.  3.  Patient was to follow-up with IOP September 29th at 9:00 a.m.  If      patient was to fail to be able to follow behavioral plan and be      compliant with aftercare plan, she is to call [facility] or Dr. Kathrynn Running      to follow through on getting ECT arranged.  4.  Patient also is to schedule with Dr. Kathrynn Running within one week after      finishing IOP and then with Dr. Senaida Ores, November 8th, due to      insurance issues, to decrease financial burden of follow-up and      hopefully increase compliance.   The patient was discharged in improved condition.   DISCHARGE DIAGNOSES:   AXIS I:  1.  Major depressive disorder, recurrent, severe.  2.  Generalized anxiety disorder.  3.  Panic disorder without agoraphobia.  4.  History of alcohol dependence, in 11 year remission.  5.  History of  opiate dependence in remission.   AXIS II:  Deferred.   AXIS III:  Hypothyroidism.   AXIS IV:  Severe - Lack of social support and financial stress.  Episodic  conflict with family.   AXIS V:  Global Assessment of Functioning on discharge was 55.     Jame   JEM/MEDQ  D:  05/14/2004  T:  05/15/2004  Job:  [id number]

## 2010-12-01 NOTE — H&P (Signed)
NAME:  Melinda [name], Melinda [name]                         ACCOUNT NO.:  [id number]   MEDICAL RECORD NO.:  [id number]                   PATIENT TYPE:  IPS   LOCATION:  0303                                 [facility]ACILITY[facility]  [facility]   PHYSICIAN:  Carolanne Grumbling, M.D.                 DATE OF BIRTH:  05/19/1941   DATE OF ADMISSION:  09/20/2003  DATE OF DISCHARGE:  09/22/2003                         PSYCHIATRIC ADMISSION ASSESSMENT   IDENTIFYING INFORMATION:  The patient is a 70 year old divorced white female  voluntarily admitted on September 20, 2003.   HISTORY OF PRESENT ILLNESS:  The patient presents with a history of  increasing dizziness, increasing depression.  The patient states she was in  a 28-day program at [facility], had developed the flu and had to leave the  program.  The patient called her daughter who was upset that she was  discharged.  The patient was very overwhelmed when she got back home,  realizing what she has been feeling like and doing for the past few years.  The patient was unable to contract for safety.   PAST PSYCHIATRIC HISTORY:  This is the third admission to [facility].  The patient was recently here and then had to go into the [employer]  program for substance abuse.  She was in the IOP program as well.   SUBSTANCE ABUSE HISTORY:  Nonsmoker.  No recent alcohol or drug use.  The  patient did have recent dependence on opiates.   PAST MEDICAL HISTORY:  Primary care [place] Winkowski: Thora Lance, M.D.  Medical  problems: Chronic back pain and hypothyroidism.   MEDICATIONS:  1. Estrace 1 mg p.o. daily.  2. Ambien 10 mg q.h.s.  3. Seroquel 100 mg q.h.s. 100 mg q.h.s.  4. Levothyroxine 125 mcg.  5. Effexor 300 mg daily.  6. Neurontin 100 mg t.i.d.   DRUG ALLERGIES:  No known allergies.   PHYSICAL EXAMINATION:  GENERAL:  The patient is in no acute distress.  She  is well nourished, well kept.  VITAL SIGNS:  Her vital signs are stable.  Temperature 96.8, heart rate 55,  respirations 20, blood pressure 117/70.  SKIN:  Warm and dry.  NEUROLOGIC:  Nonfocal neurological findings.   LABORATORY DATA:  CBC is within normal limits.  CMET: Within normal limits.  Cloudy urine.   SOCIAL HISTORY:  She is a 70 year old divorced white female.  She lives  alone.  She is a retired Runner, broadcasting/film/video.   FAMILY HISTORY:  The patient denies.   MENTAL STATUS EXAM:  She is an alert, middle-aged, cooperative female,  casually dressed, good eye contact.  Speech is clear.  The patient feels  angry over her leaving her rehabilitation program.  She is also complaining  of some dizziness at this time.  Affect is full range.  Thought processes  are coherent.  There is no evidence of psychosis.  Cognitive functioning:  Intact.  Memory  is good.  Judgment and insight are fair.   ADMISSION DIAGNOSES:   AXIS I:  1. Mood disorder.  2. Opiate dependence in partial remission.   AXIS II:  Deferred.   AXIS III:  1. Back pain.  2. Hypothyroidism.   AXIS IV:  Other psychosocial problems, medical problems.   AXIS V:  Current is 30, this past year is 64.   INITIAL PLAN OF CARE:  Plan is a voluntary admission to [facility] for inability to contract for safety.  Will stabilize mood and  thinking so the patient can be safe and functional.  Will resume her  medications.  Will decrease the Effexor to see if that is making the patient  feel more anxious.  Will also decreased the Neurontin dose to see if that is  adding to the patient's morning dizziness.  The patient is to increase  coping skills, to attend groups, and to follow up with the IOP program.   ESTIMATED LENGTH OF STAY:  Three to five days or more depending on the  patient's response to medications.     Landry Corporal, N.P.                       Carolanne Grumbling, M.D.    JO/MEDQ  D:  09/22/2003  T:  09/22/2003  Job:  [id number]

## 2010-12-01 NOTE — Discharge Summary (Signed)
NAME:  Melinda [name], Melinda [name]                         ACCOUNT NO.:  [id number]   MEDICAL RECORD NO.:  [id number]                   PATIENT TYPE:  IPS   LOCATION:  0303                                 FACILITY:  [facility]   PHYSICIAN:  Carolanne Grumbling, M.D.                 DATE OF BIRTH:  1940-11-15   DATE OF ADMISSION:  08/16/2003  DATE OF DISCHARGE:  08/21/2003                                 DISCHARGE SUMMARY   INITIAL ASSESSMENT AND DIAGNOSIS:  Melinda [name] was a 70 year old woman who was  admitted to the hospital wishing to be detoxed from methadone. She also had  been depressed with thoughts of driving her car into another car or into a  tree or something. She had recently been in [place] where her father was  dying, had run out of her methadone, and had serious withdraw symptoms and  consequently wanted off the methadone.   Mental status at the time of the initial evaluation revealed an alert and  oriented woman who was appropriately dressed and groomed. She made good eye  contact. Speech was clear. Mood was depressed with flat affect and tearful.  Thoughts were coherent. There was no evidence of any thought disorder or  other psychosis. Judgment and insight were fair. She seemed to be at least  average intelligence.   PHYSICAL EXAMINATION:  Noncontributory.   ADMISSION DIAGNOSES:   AXIS I:  1. Depressive disorder, not otherwise specified.  2. Opioid dependent.   AXIS II:  Deferred.   AXIS III:  1. History of chronic back pain.  2. Hypothyroidism.   AXIS IV:  Moderate.   AXIS V:  30/65.   FINDINGS:  Laboratory exams had been performed prior to her admission to the  hospital, and no further exams were done while she was an inpatient.   HOSPITAL COURSE:  While in the hospital, Melinda [name] was generally  cantankerous, anger. She was irritable. She was demanding at times and could  be hostile. Her daughter was her big support and indicated her mother  actually could be much nicer  and was a very accomplished woman and had been  a good mother over the years. She did have a history of alcoholism. She was  taking the methadone because she had surgery at one point, was put on  OxyContin but got apparently addicted to that, and had to be put on the  methadone to get off the OxyContin. She said that she had a hard time in  [place] where she had been just prior this admission. Her father had  died. She and her mother have a long history of poor relationship, and it  was not made any better by the recent situation. Consequently, she got  depressed even further over that and said she needed some one to one time to  talk about that which she did not get enough of while she was in the  hospital. She was withdrawn from the methadone, and she was not suicidal or  homicidal, and consequently, she was discharged home. It was recommended she  come to the chemical dependence intensive outpatient program, but she  strongly did not want to be involved in that program, and it was then  recommended she come to the mental health intensive outpatient program. At  the time of this dictation, she had shown up but did not stay.   FINAL DIAGNOSES:   AXIS I:  1. Depressive disorder, not otherwise specified.  2. Opioid and benzodiazepine dependences.   AXIS II:  Deferred.   AXIS III:  1. History of chronic back pain.  2. Hypothyroidism.   AXIS IV:  Moderate.   AXIS V:  50/65 post hospital care.   PLAN:  At the time of discharge, she was taking:  1. Estrace 1 mg daily.  2. Levothyroxine 125 mcg daily.  3. Paxil CR 25 mg daily.  4. Seroquel 100 mg at bedtime.  5. Effexor XR 112.5 mg daily.  6. Ambien 10 mg at bedtime.   She was to have followed at [facility] intensive outpatient program but did  not stay in the that program and was referred back to her psychiatrist and  her outpatient therapists.                                               Carolanne Grumbling, M.D.     GT/MEDQ  D:  09/13/2003  T:  09/14/2003  Job:  [id number]

## 2010-12-01 NOTE — Discharge Summary (Signed)
NAME:  Melinda [name], Melinda [name]                         ACCOUNT NO.:  [id number]   MEDICAL RECORD NO.:  [id number]                   PATIENT TYPE:  IPS   LOCATION:  0304                                 FACILITY:  [facility]   PHYSICIAN:  Jeanice Lim, M.D.              DATE OF BIRTH:  [birth year]   DATE OF ADMISSION:  08/24/2003  DATE OF DISCHARGE:  09/05/2003                                 DISCHARGE SUMMARY   IDENTIFYING DATA:  This is a 70 year old Caucasian female voluntarily  admitted on August 24, 2003 with a history of depression, increased  anxiety, difficulty sleeping.  Has not showered, could not stop shaking.  Was unable to follow up with intensive outpatient program.  Had not been  sleeping or eating well since recent discharge.  Was here August 21, 2003  for detox from Xanax and methadone and was in IOP, which she was unable to  attend due to the severity of her symptoms.   MEDICAL PROBLEMS:  Hypothyroidism.   MEDICATIONS:  The patient had been on Xanax and methadone for years.  Levoxyl 0.125 mg, Effexor XR 150 mg, Estrace 1 mg daily, Paxil CR (the  patient does not take it), Ambien 10 mg, Seroquel 100 mg q.h.s.   ALLERGIES:  No known drug allergies.   PHYSICAL EXAMINATION:  Within normal limits.  Neurologically nonfocal.  Hearing aids.   MENTAL STATUS EXAM:  The patient was in bed, somewhat cooperative, little  eye contact.  Speech clear.  Mood very anxious, tearful.  Affect irritable,  tearful and anxious.  Thought processes goal directed, feeling hopeless,  helpless, worthless, overwhelmed and positive suicidal ideation.  Cognitively intact.  Judgment and insight fair to poor.   ADMISSION DIAGNOSES:   AXIS I:  1. Major depressive disorder, recurrent, severe.  2. History of benzodiazepine and opiate abuse and dependence.  3. Status post recent detox after multiple years on both substances.   AXIS II:  Deferred.   AXIS III:  1. Back pain.  2. Ankle swelling.   AXIS  IV:  Moderate (problems with psychosocial stressors and medical  problems).   AXIS V:  35/60.   HOSPITAL COURSE:  The patient was admitted and ordered routine p.r.n.  medications and underwent further monitoring.  Was encouraged to participate  in individual, group and milieu therapy.  The patient was placed on safety  checks and monitored regarding her tolerance to medications.  The patient  complained of severe anxiety, feeling overwhelmed, depressed, irritable with  suicidal thoughts.  The patient contracted for safety in the hospital.  The  patient was optimized on Effexor due to concern of discontinuation syndrome  as well as prolonged withdrawal syndrome from a recent detox.  The patient  had not relapsed and reported feeling more anxious, dizzy, unable to leave  room at times, just wanted to go home and die.  Felt abandoned by daughter  who was, in  fact, quite supportive but admitted to being worn out with  patient's history.  The patient was quite reactive, labile but gradually  improved as she was stabilized on medication, which she could tolerate.  Seroquel appeared to be causing side effects and, therefore, Neurontin was  optimized, Effexor adjusted and a trial with Zyprexa to assist with sleep  since trazodone and Seroquel did not appear to be well-tolerated.  The  patient was optimized on Zyprexa to 7.5 mg at night.  The patient was able  to get out of her room and attend groups, gradually increasing her  participation as her mood and nerves improved.  The patient was eventually  optimized on Zyprexa 7.5 mg at 8:30 a.m., Neurontin 300 mg three times a day  and 900 mg q.h.s., trazodone 150 mg at 8:30 a.m., Ambien 10 mg (was given  one night but then discontinued due to patient's history of polysubstance  abuse and dependence), optimized on Effexor XR 300 mg q.a.m.  The patient  did receive a couple of doses of Librium early on due to concern of  prolonged withdrawal  symptoms.   CONDITION ON DISCHARGE:  Markedly improved.  Mood was more euthymic, less  anxious, feeling hopeful and that she could cope with stress.  No dangerous  ideation or psychotic symptoms nor acute withdrawal symptoms.  The patient  was given medication education.   DISCHARGE MEDICATIONS:  1. Estrace 1 mg q.a.m.  2. Levothyroxine 125 mcg q.a.m.  3. Trazodone 150 mg at 8:30 p.m.  4. Effexor XR 150 mg, 2 q.a.m.  5. Neurontin 300 mg t.i.d. and 3 q.h.s.  6. Zyprexa 7.5 mg q.h.s.  7. Vistaril 50 mg q.6h. p.r.n.   FOLLOW UP:  The patient was to follow up with [facility] on  Monday, September 06, 2003.   DISCHARGE DIAGNOSES:   AXIS I:  1. Major depressive disorder, recurrent, severe.  2. History of benzodiazepine and opiate abuse and dependence.  3. Status post recent detox after multiple years on both substances.   AXIS II:  Deferred.   AXIS III:  1. Back pain.  2. Ankle swelling.   AXIS IV:  Moderate (problems with psychosocial stressors and medical  problems).   AXIS V:  Global Assessment of Functioning on discharge 55.                                               Jeanice Lim, M.D.    JEM/MEDQ  D:  10/03/2003  T:  10/04/2003  Job:  [id number]

## 2010-12-01 NOTE — Discharge Summary (Signed)
NAME:  Melinda [name], Melinda [name]                         ACCOUNT NO.:  [id number]   MEDICAL RECORD NO.:  [id number]                   PATIENT TYPE:  IPS   LOCATION:  0304                                 FACILITY:  [facility]   PHYSICIAN:  Jeanice Lim, M.D.              DATE OF BIRTH:  05-10-1941   DATE OF ADMISSION:  09/30/2003  DATE OF DISCHARGE:  10/08/2003                                 DISCHARGE SUMMARY   IDENTIFYING DATA:  This is a 70 year old divorced Caucasian female  voluntarily admitted with a history of panic disorder and depression.  Had  been feeling more panicky with suicidal thoughts, thought of killing herself  by crashing car.  Had been in [place] but had to leave due to flu symptoms.  Tearful, anxious, panicky, difficulty sleeping, negative thoughts.  Feeling  overwhelmed and tired of living.   MEDICATIONS:  Effexor, Neurontin, Estrace, Seroquel and levothyroxine.   ALLERGIES:  No known drug allergies.   PHYSICAL EXAMINATION:  Within normal limits.  Neurologically nonfocal.   LABORATORY DATA:  Routine admission labs within normal limits.   MENTAL STATUS EXAM:  Fully alert, pleasant but tearful, anxious,  cooperative.  Speech within normal limits.  Mood depressed, hopeless,  helpless.  Thought processes goal directed.  Positive agitation and suicidal  ideation, wanting to crash car.  Cognitively intact.  Judgment and insight  fair to poor.  The patient had been taking Neurontin 300 mg t.i.d. and 900  mg q.h.s., Zyprexa 7.5 mg q.h.s., trazodone 150 mg q.h.s., Vistaril 50 mg  b.i.d. p.r.n.  The patient admits to not taking medications as prescribed  consistently since medications were changed at [facility].   ADMISSION DIAGNOSES:   AXIS I:  1. Major depressive disorder, recurrent, moderate.  2. Generalized anxiety disorder.  3. Panic disorder without agoraphobia.   AXIS II:  Deferred.   AXIS III:  1. Hypothyroidism.  2. Rule out urinary tract infection.   AXIS IV:   Moderate to severe (stressors related to limited support system,  chronic substance abuse issues as well as a mood disorder).   AXIS V:  32/60.   HOSPITAL COURSE:  The patient was admitted and ordered p.r.n. medications  and underwent further monitoring.  Was encouraged to participated in  individual, group and milieu therapy.  Medical etiology and psychopathology  was evaluated and ruled out.  The patient was resumed on psychotropics,  including Neurontin and Zyprexa optimized for thought agitation and  lability.  The patient remained tearful, anxious, depressed with decreased  frustrated.  Tolerance at times.  Blowing up at times.  Difficult with other  patients.  Reported positive suicidal ideation and anxiety, especially in  the morning.  Reported dizziness resolved as medications were adjusted.  Reported a decrease in suicidal ideation.  Told dangerous ideation resolved.  Mood improved and patient was less anxious.   CONDITION ON DISCHARGE:  Improved.  More calm,  less depressed.  No dangerous  ideation.  No psychotic symptoms.  Motivated to be compliant with follow-up  plan.  The patient was discharged after medication education.   DISCHARGE MEDICATIONS:  1. Naprosyn 500 mg b.i.d.  2. Zyprexa 7.5 mg q.h.s.  3. Effexor 37.5 mg b.i.d.  4. Lexapro 20 mg q.a.m.  5. Lamictal 100 mg q.a.m.  6. Vistaril 50 mg q.8h. p.r.n.  7. Neurontin 300 mg t.i.d. and 3 q.h.s.  8. Levothyroxine 125 mcg q.a.m.  9. Trazodone 150 mg q.h.s.  10.      Estradiol 1 mg q.a.m.  11.      Ambien 5 mg q.h.s. p.r.n. insomnia.   The patient was in the process of being tapered off of Effexor gradually due  to patient's severe anxiety symptoms and risk of discontinuation syndrome as  she was being stabilized Lexapro.   FOLLOW UP:  The patient was to follow up with the mental health IOP on  Monday, October 11, 2003 at 9 a.m.   DISCHARGE DIAGNOSES:   AXIS I:  1. Major depressive disorder, recurrent,  moderate.  2. Generalized anxiety disorder.  3. Panic disorder without agoraphobia.   AXIS II:  Deferred.   AXIS III:  1. Hypothyroidism.  2. Rule out urinary tract infection.   AXIS IV:  Moderate to severe (stressors related to limited support system,  chronic substance abuse issues as well as a mood disorder).   AXIS V:  32/60.                                               Jeanice Lim, M.D.    JEM/MEDQ  D:  11/23/2003  T:  11/24/2003  Job:  [id number]

## 2010-12-01 NOTE — H&P (Signed)
Melinda [name], Melinda [name]               ACCOUNT NO.:  [id number]   MEDICAL RECORD NO.:  [id number]           PATIENT TYPE:   LOCATION:                                 FACILITY:   PHYSICIAN:  Thora Lance, M.D.  DATE OF BIRTH:  03-17-1941   DATE OF ADMISSION:  DATE OF DISCHARGE:                              HISTORY & PHYSICAL   ADMIT HISTORY AND PHYSICAL:   CHIEF COMPLAINT:  Syncope.   HISTORY OF PRESENT ILLNESS:  Melinda [name] is a 70 year old white  female patient of Dr. Amedeo Kinsman with a history of nausea and  decreased appetite over the last couple days.  She has had no vomiting  or diarrhea.  Yesterday evening she became lightheaded and passed out on  2 occasions.  She came to fairly quickly after the falls.  She  sustained a right eye hematoma that she states hurts.  She also hurts in  the left posterior lower rib area.  She is weak and dizzy with any  movement in the office today and unable to walk without feeling as if  she is going to pass out.  Orthostatic blood pressures in the office,  98/50 lying, 78/50 sitting.   She has a long psychiatric history and is on multiple psychotropic  drugs.  She has had a history of orthostatic hypotension secondary to  her medications.   PAST MEDICAL HISTORY:  1. Severe depression, psychiatrist Dr. Madaline Guthrie, with multiple      admissions.  2. Hypothyroidism.  3. Allergic rhinitis.  4. Back pain with spinal stenosis.  5. Diarrhea.  6. CIS status post hysterectomy.  7. Ovarian cysts.  8. Bilateral hearing loss, on hearing aids for 24 years.  9. Osteopenia.  10.Large capacity, hyposensitive bladder, Dr. Patsi Sears.   PAST SURGICAL HISTORY:  1. TAH, 1975, [facility].  2. Bladder tack/BSO, 1978.  3. Back surgery because of spinal stenosis, Dr. Franky Macho, January 1998.  4. Tonsillectomy.  5. Left foot surgery for fracture.  6. Ganglion cyst removed, right foot and left hand.  7. Breast implants, silicone, Dr. Dub Amis.   INJURIES:  1. Left foot fracture.  2. Rib fractures.   MEDICATIONS:  1. Zyprexa 2.5 mg at bedtime.  2. Nefazodone 100 mg 1/2 tablet in the morning, 2 tablets in the p.m.  3. Clonazepam 0.5 mg 1 in the a.m., 2 in the p.m.  4. Geodon 40 mg 1 in the morning, 2 at night.  5. Midodrine 10 mg 1 p.o. t.i.d.  6. Florinef 0.1 mg 5 tablets daily in the morning.  7. Paxil 20 mg 1/2 tablet daily.  8. Lamictal 100 mg 1/2 tablet in the morning.  9. Estradiol 1 mg 1 in the morning.  10.Levothyroxine 75 mcg daily.  11.Calcium plus vitamin D 1 a day.  12.Multivitamin daily.   DRUG ALLERGIES:  None.   FAMILY HISTORY:  Mother, age 65, with history of wrist and hip fracture.  Father, age 31, died of unknown reasons.  Brother had a benign tumor.  Sister with gallbladder issues.   SOCIAL HISTORY:  She lives alone.  She  is a previous smoker, 1 pack a  day; quit 4 years ago.  Drug and alcohol:  None.   OBJECTIVE DATA:  VITAL SIGNS:  Temperature 97.5.  Pulse 76.  Respirations 20.  Blood pressure, lying, 98/50, sitting 78/50.  GENERAL:  She has a flat affect.  She has an obvious bruise around the  right eye.  HEENT:  Eyes:  PERRLA with negative nystagmus.  Good EOMs.  Funduscopic  exam unremarkable.  Ears:  Unremarkable.  Nose:  There is some dried  blood in the left nostril, but no active bleeding noted.  Throat exam  confirms.  There is no erythema, exudate or rash.  NECK:  No tenderness.  Good range of motion.  HEART:  Normal S1, S2, rate in the 80s.  CHEST:  Breath sounds are clear.  She has some tenderness in the left  lower posterior rib area.  ABDOMEN:  Soft and nondistended.  Bowel sounds present, all 4 quadrants.  No abdominal tenderness is noted.  EXTREMITY EXAM:  Reveals no edema.  NEURO:  The patient answers appropriately.  Cranial nerves II-XII  intact.  When she attempts ambulation, however, she gets presyncopal.   IMPRESSION:  Syncope, probable dehydration, long history of  depression,  many psychotropic medications.   PLAN:  Will admit to an obs bed.  IV fluids, labs, x-ray of the right  orbit and the left rib.  Dr. Valentina Lucks to follow.     Tamera C. Melvyn Neth, N.P.    ______________________________  Thora Lance, M.D.   TCL/MEDQ  D:  11/13/2006  T:  11/13/2006  Job:  [id number]   cc:   Thora Lance, M.D.

## 2010-12-01 NOTE — Discharge Summary (Signed)
NAME:  Melinda [name], Melinda [name]                         ACCOUNT NO.:  [id number]   MEDICAL RECORD NO.:  [id number]                   PATIENT TYPE:  IPS   LOCATION:  0303                                 FACILITY:  [facility]   PHYSICIAN:  Carolanne Grumbling, M.D.                 DATE OF BIRTH:  1941/02/17   DATE OF ADMISSION:  09/20/2003  DATE OF DISCHARGE:  09/22/2003                                 DISCHARGE SUMMARY   INITIAL ASSESSMENT AND DIAGNOSIS:  Melinda [name] was readmitted to the hospital  after complaining of dizziness and depression.  Melinda [name] had been in a 28-day  rehab program at [facility].  Melinda [name] got the flu, Melinda [name] said, and was unable to  participate in the program and was discharged.  Melinda [name] felt overwhelmed when  Melinda [name] went back home.  Melinda [name] could not take care of herself or take care of  things around the house and Melinda [name] was unable to contract for safety at the  time of readmission to the hospital.  This is Melinda [name] third admission here.  Melinda [name]  also was referred to the IOP program after the last admission but Melinda [name] went  to the [facility] instead.   MENTAL STATUS AT THE TIME OF THE INITIAL EVALUATION:  Revealed an alert,  oriented woman who was appropriately dressed and groomed.  Speech was clear.  Melinda [name] was angry.  Affect was full range but on the depressed side.  Thoughts  were logical and coherent.  There was no evidence of any thought disorder or  other psychosis.  Insight and judgment were fair.   PHYSICAL EXAMINATION:  Noncontributory to the present situation.   ADMITTING DIAGNOSIS:   AXIS I:  1. Mood disorder not otherwise specified.  2. Depressive disorder not otherwise specified.  3. 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   AXIS II:  Deferred.   AXIS III:  1. Chronic back pain.  2. Hypothyroidism.   AXIS IV:  Moderate.   AXIS V:  30/65.   FINDINGS:  There were no indicated laboratory examinations on this  admission.   HOSPITAL COURSE:  While in the hospital, Melinda [name] was here for  2 days.  Melinda [name]  basically regained Melinda [name] composure, believed Melinda [name] was better off at home than  in the hospital.  Melinda [name] denied any suicidal thoughts.  Melinda [name] was able to  contract for safety.  Melinda [name] still remains mildly depressed, angry, easily  agitated and irritable.  Melinda [name] has not been able to make a full commitment to  what Melinda [name] needs to get on with Melinda [name] life.  Melinda [name] seems to want to do things that  are convenient and comfortable, rather than things that need to be done such  as a rehab program.  Melinda [name] I believe from the past has a history of alcoholism  in addition to Melinda [name] recent opioids abuse and all those issues need to be  addressed as  part of substance abuse issues.  Melinda [name] said Melinda [name] was much better  with the problem Melinda [name] had last time with resolving issues around Melinda [name] father's  death and Melinda [name] relationship with Melinda [name] mother.  Melinda [name] believed that had improved  considerably.  Melinda [name] was much less angry this time, must less depressed.  Melinda [name]  moods were more stable.  Consequently, Melinda [name] was discharged per Melinda [name] request  with the recommendation that Melinda [name] come to IOP.  At the time of this  dictation, Melinda [name] has not reported to the IOP and this hospital.   FINAL DIAGNOSES:   AXIS I:  1. Mood disorder not otherwise specified.  2. Depressive disorder not otherwise specified..  3. 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   AXIS II:  Deferred.   AXIS III:  1. Chronic back pain.  2. Hypothyroidism.   AXIS IV:  Moderate.   AXIS V:  50/60.   MEDICATIONS AT THE TIME OF DISCHARGE:  1. Neurontin 100 mg 3 times a day.  2. Levothyroxine 125 mcg daily.  3. Seroquel 100 mg at bedtime and 25 mg 3 times a day as needed.  4. Effexor XR 112.5 mg daily.  5. Estrace 1 mg daily.   POST HOSPITAL CARE PLANS:  Melinda [name] was to report to the Intensive Outpatient  Program on March 10.   DIET AND ACTIVITY:  There were no restrictions placed on Melinda [name] diet or Melinda [name]  activity.                                               Carolanne Grumbling, M.D.     GT/MEDQ  D:  09/29/2003  T:  10/01/2003  Job:  [id number]

## 2010-12-01 NOTE — H&P (Signed)
NAME:  CATALEYA, CRISTINA                         ACCOUNT NO.:  [id number]   MEDICAL RECORD NO.:  [id number]                   PATIENT TYPE:  IPS   LOCATION:  0303                                 FACILITY:  [facility]   PHYSICIAN:  Jeanice Lim, M.D.              DATE OF BIRTH:  27-Jan-1941   DATE OF ADMISSION:  08/16/2003  DATE OF DISCHARGE:                         PSYCHIATRIC ADMISSION ASSESSMENT   IDENTIFYING INFORMATION:  The patient is a 70 year old divorced white female  voluntarily admitted on August 16, 2003.   HISTORY OF PRESENT ILLNESS:  The patient presents with a history of opiate  dependence.  The patient was started on opiates after her back surgery in  1999.  She has been taking methadone for the past five years.  She has also  been on Xanax as well.  She finds that she has been having increased use of  her pain medications.  She states that her whole world has crumbled.  She  has been having increasing depression.  She states that in 2005/01/18her  stepfather had passed away.  The patient was off her medications for a few  days while she was in [place].  She thought that she was having the flu  where she feels at this time she was probably was having withdrawal  symptoms.  The patient returned back on her medications.  She states when  she came home, she felt the sensation of sadness and loneliness.  Her  apartment was in chaos.  She states that she just wanted out.  She has  been using protein powder as her meals.  She denies any psychotic symptoms.  The patient wants to be detoxified.  She denies any current suicidal  ideation at this time.   PAST PSYCHIATRIC HISTORY:  This is the first hospitalization at [facility].  She was hospitalized prior at [facility] for suicidal  thoughts.  She had been detoxified in 1994.  Her longest history of sobriety  has been 12 years.  She sees a therapist, Arvilla Market in [place].  She  has been seeing her for  the past four years.   SUBSTANCE ABUSE HISTORY:  The patient smokes.  She is a recovering  alcoholic.  She has not had any alcohol use in the past 10 years.  She  denies any illegal substance abuse.   PAST MEDICAL HISTORY:  Primary care Tiernan Millikin: She was seeing Dr. Lew Dawes.  She states he closed his practice.  Medical problems: History of back  surgery in 1999, hypothyroidism.   MEDICATIONS:  1. Xanax 1 mg q.i.d.  2. Levoxyl 0.125 mg daily.  3. Effexor XR prescribed by Dr. Valentina Lucks at [facility].  4. KCl 10 mEq p.r.n.  5. Lasix 40 mg p.r.n. for ankle swelling.  6. Estradiol 1 mg daily.   DRUG ALLERGIES:  No known allergies.   PHYSICAL EXAMINATION:  GENERAL:  Physical examination was  done at [facility].  The patient is a older, middle-aged female.  She does not appear in  any acute distress.  She does have bilateral hearing aids.  VITAL SIGNS:  Her vital signs are stable, temperature 96.8, heart rate 78,  respirations 18, blood pressure 138/85.   LABORATORY DATA:  The patient had a normal EKG.  CBC was within normal  limits.  Urinalysis was negative.  Alcohol level was less than 5.  Urine  drug screen was positive for benzodiazepines.  BMET was within normal  limits.   SOCIAL HISTORY:  She is a 70 year old divorced white female.  She has two  children, both adults.  She lives alone.  She is not working.  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  The patient was told to leave her job after she was  drinking.  She has a master's degree in special education.  She has a court  date pending on February 14 for shoplifting charge.   FAMILY HISTORY:  Family history is unclear.   MENTAL STATUS EXAM:  She is an alert, middle-aged, casually dressed female,  good eye contact, cooperative.  Speech is clear.  Mood is depressed.  She is  flat, tearful for a few moments.  Thought processes are coherent.  There is  no evidence of psychosis, does not appear to be distracted by internal  stimuli, no  suicidal or homicidal thoughts.  Cognitive functioning: Intact.  Memory is good.  Judgment is fair.  Insight seems to be fair to good.   ADMISSION DIAGNOSES:   AXIS I:  1. Depressive disorder, not otherwise specified.  2. Opiate dependence.  3. Benzodiazepine dependence.   AXIS II:  Deferred.   AXIS III:  1. History of back pain.  2. Hypothyroidism.   AXIS IV:  Problems with lack of support, has no friends, no social outlets,  occupation, other psychosocial problems, medical problems.   AXIS V:  Current is 30, this past year is 81.   INITIAL PLAN OF CARE:  Plan is a voluntary admission to [facility] for opiate and benzodiazepine dependence, depressive symptoms.  Contract for safety, check every 15 minutes.  Will stabilize and detoxify  with low dose Librium and clonidine protocol.  Will resume the patient's  other medications.  The patient is to attend groups, increase coping skills.  Will have a family session with daughter for continued support, relapse  prevention.  The patient may need follow up with pain clinic.  The patient  is to follow up with mental health.   ESTIMATED LENGTH OF STAY:  Three to five days or more depending on the  patient's response to medications.     Landry Corporal, N.P.                       Jeanice Lim, M.D.    JO/MEDQ  D:  08/18/2003  T:  08/18/2003  Job:  [id number]

## 2010-12-01 NOTE — Discharge Summary (Signed)
[name], Melinda [name]               ACCOUNT NO.:  [id number]   MEDICAL RECORD NO.:  [id number]          PATIENT TYPE:  OBV   LOCATION:  5157                         FACILITY:  [facility]   PHYSICIAN:  Thora Lance, M.D.  DATE OF BIRTH:  [birth year]   DATE OF ADMISSION:  11/13/2006  DATE OF DISCHARGE:  11/14/2006                               DISCHARGE SUMMARY   HISTORY:  A 70 year old white female who presented with a history of  nausea and decreased appetite for the last 2 days prior to admission.  She had no vomiting or diarrhea.  The day prior to admission she became  lightheaded and had a syncopal episode on 2 occasions with falls.  She  sustained a right eye hematoma.  In the office she was extremely dizzy  on standing with blood pressure drop in the 78/50 while sitting.   Significant findings:  Blood pressure lyig98/50, sitting 78/50,  ecchymosis around the right eye.  Lungs clear.  Heart regular rate and  rhythm.   LABORATORY DATA:  WBC 6.8, hemoglobin 13.4, platelet count 241, PT 14.4,  PTT 30. Sodium 136, potassium 3.5, chloride 104, bicarbonate 29, glucose  102, BUN 10, creatinine 1.1.   HOSPITAL COURSE:  The patient was admitted with severe orthostatic  dizziness.  She was aggressively given IV fluids.  Her potassium was  repleted as it dropped to 3.1 on the second hospital day.  An orbital  and rib series showed no evidence of fracture.  The patient was found to  have a blood pressure 140/87 standing later on the day on Nov 14, 2006.  She was discharged in good condition.   DISCHARGE DIAGNOSIS:  1. Orthostatic hypotension.  2. Severe depression.  3. Hypothyroidism.   DISCHARGE MEDICATIONS:  1. Zyprexa 2.5 mg q.h.s.  2. Nefazodone 100 mg 1/2 a.m., 2 p.m.  3. Clonazepam 0.5 mg 1 a.m., 2 p.m.  4. Geodon 40 mg 1 a.m., 2 p.m.  5. Midodrine 10 mg t.i.d.  6. Florinef 0.1 mg 5 tablets a day.  7. Paxil 20 mg 1/2 a.m.  8. Lamictal 100 mg 1/2 a.m.  9. Estradiol 1 mg a.m.  10.Levothyroxine 75 mg a.m.  11.Calcium plus D daily.  12.Multivitamin daily,  13.Potassium chloride 20 mEq a day.   DISPOSITION:  Discharged home.   ACTIVITY:  As tolerated.   DIET:  Regular diet.   FOLLOW-UP APPOINTMENT:  Two weeks Dr. Valentina Lucks.           ______________________________  Thora Lance, M.D.     Delorse Limber  D:  04/16/2007  T:  04/16/2007  Job:  [id number]

## 2011-01-03 ENCOUNTER — Ambulatory Visit: Payer: Self-pay | Admitting: Physical Therapy

## 2011-01-09 ENCOUNTER — Other Ambulatory Visit: Payer: Self-pay | Admitting: Neurology

## 2011-01-09 DIAGNOSIS — R269 Unspecified abnormalities of gait and mobility: Secondary | ICD-10-CM

## 2011-01-09 DIAGNOSIS — I951 Orthostatic hypotension: Secondary | ICD-10-CM

## 2011-01-09 DIAGNOSIS — G20 Parkinson's disease: Secondary | ICD-10-CM

## 2011-01-11 ENCOUNTER — Ambulatory Visit: Payer: Medicare Other | Attending: Neurology | Admitting: Physical Therapy

## 2011-01-11 DIAGNOSIS — R269 Unspecified abnormalities of gait and mobility: Secondary | ICD-10-CM | POA: Insufficient documentation

## 2011-01-11 DIAGNOSIS — IMO0001 Reserved for inherently not codable concepts without codable children: Secondary | ICD-10-CM | POA: Insufficient documentation

## 2011-01-11 DIAGNOSIS — G20 Parkinson's disease: Secondary | ICD-10-CM | POA: Insufficient documentation

## 2011-01-11 DIAGNOSIS — G20A1 Parkinson's disease without dyskinesia, without mention of fluctuations: Secondary | ICD-10-CM | POA: Insufficient documentation

## 2011-01-15 ENCOUNTER — Ambulatory Visit: Payer: Medicare Other | Attending: Neurology | Admitting: Physical Therapy

## 2011-01-15 DIAGNOSIS — G20A1 Parkinson's disease without dyskinesia, without mention of fluctuations: Secondary | ICD-10-CM | POA: Insufficient documentation

## 2011-01-15 DIAGNOSIS — IMO0001 Reserved for inherently not codable concepts without codable children: Secondary | ICD-10-CM | POA: Insufficient documentation

## 2011-01-15 DIAGNOSIS — G20 Parkinson's disease: Secondary | ICD-10-CM | POA: Insufficient documentation

## 2011-01-15 DIAGNOSIS — R269 Unspecified abnormalities of gait and mobility: Secondary | ICD-10-CM | POA: Insufficient documentation

## 2011-01-22 ENCOUNTER — Ambulatory Visit
Admission: RE | Admit: 2011-01-22 | Discharge: 2011-01-22 | Disposition: A | Discharge status: Home / Self Care | Payer: Medicare Other | Source: Ambulatory Visit | Attending: Neurology | Admitting: Neurology

## 2011-01-22 DIAGNOSIS — I951 Orthostatic hypotension: Secondary | ICD-10-CM

## 2011-01-22 DIAGNOSIS — R269 Unspecified abnormalities of gait and mobility: Secondary | ICD-10-CM

## 2011-01-22 DIAGNOSIS — G20 Parkinson's disease: Secondary | ICD-10-CM

## 2011-01-23 ENCOUNTER — Ambulatory Visit: Payer: Medicare Other | Admitting: Physical Therapy

## 2011-01-26 ENCOUNTER — Ambulatory Visit: Payer: Medicare Other | Admitting: Physical Therapy

## 2011-01-30 ENCOUNTER — Ambulatory Visit: Payer: Medicare Other | Admitting: Physical Therapy

## 2011-02-02 ENCOUNTER — Ambulatory Visit: Payer: Medicare Other | Admitting: Physical Therapy

## 2011-02-06 ENCOUNTER — Ambulatory Visit: Payer: Medicare Other | Admitting: Physical Therapy

## 2011-02-09 ENCOUNTER — Ambulatory Visit: Payer: Self-pay | Admitting: Physical Therapy

## 2011-02-13 ENCOUNTER — Ambulatory Visit: Payer: Medicare Other | Admitting: Physical Therapy

## 2011-02-15 ENCOUNTER — Ambulatory Visit: Payer: Medicare Other | Attending: Neurology | Admitting: Physical Therapy

## 2011-02-15 DIAGNOSIS — R269 Unspecified abnormalities of gait and mobility: Secondary | ICD-10-CM | POA: Insufficient documentation

## 2011-02-15 DIAGNOSIS — IMO0001 Reserved for inherently not codable concepts without codable children: Secondary | ICD-10-CM | POA: Insufficient documentation

## 2011-02-15 DIAGNOSIS — G20A1 Parkinson's disease without dyskinesia, without mention of fluctuations: Secondary | ICD-10-CM | POA: Insufficient documentation

## 2011-02-15 DIAGNOSIS — G20 Parkinson's disease: Secondary | ICD-10-CM | POA: Insufficient documentation

## 2011-02-20 ENCOUNTER — Ambulatory Visit: Payer: Medicare Other | Admitting: Physical Therapy

## 2011-02-23 ENCOUNTER — Ambulatory Visit: Payer: Medicare Other | Admitting: Physical Therapy

## 2011-02-27 ENCOUNTER — Ambulatory Visit: Payer: Medicare Other | Admitting: Physical Therapy

## 2011-03-02 ENCOUNTER — Ambulatory Visit: Payer: Medicare Other | Admitting: Physical Therapy

## 2011-03-06 ENCOUNTER — Ambulatory Visit: Payer: Medicare Other | Admitting: Physical Therapy

## 2011-03-09 ENCOUNTER — Ambulatory Visit: Payer: Medicare Other | Admitting: Physical Therapy

## 2011-03-13 ENCOUNTER — Ambulatory Visit: Payer: Medicare Other | Admitting: Physical Therapy

## 2011-03-16 ENCOUNTER — Ambulatory Visit: Payer: Medicare Other | Admitting: Physical Therapy

## 2011-04-09 LAB — COMPREHENSIVE METABOLIC PANEL
ALT: 23
AST: 33
Alkaline Phosphatase: 77
CO2: 26
Calcium: 10.1
GFR calc non Af Amer: 34 — ABNORMAL LOW
Potassium: 4.2
Sodium: 143
Total Protein: 7

## 2011-04-09 LAB — DIFFERENTIAL
Eosinophils Relative: 6 — ABNORMAL HIGH
Lymphocytes Relative: 40
Lymphs Abs: 2.9
Monocytes Absolute: 0.7

## 2011-04-09 LAB — CBC
MCV: 93.4
Platelets: 275
WBC: 7.1

## 2011-04-24 ENCOUNTER — Other Ambulatory Visit: Payer: Self-pay | Admitting: Neurology

## 2011-04-24 DIAGNOSIS — R269 Unspecified abnormalities of gait and mobility: Secondary | ICD-10-CM

## 2011-04-24 DIAGNOSIS — I951 Orthostatic hypotension: Secondary | ICD-10-CM

## 2011-04-24 DIAGNOSIS — M542 Cervicalgia: Secondary | ICD-10-CM

## 2011-04-24 DIAGNOSIS — G219 Secondary parkinsonism, unspecified: Secondary | ICD-10-CM

## 2011-04-30 ENCOUNTER — Other Ambulatory Visit: Payer: Medicare Other

## 2011-05-04 ENCOUNTER — Ambulatory Visit
Admission: RE | Admit: 2011-05-04 | Discharge: 2011-05-04 | Disposition: A | Discharge status: Home / Self Care | Payer: Medicare Other | Source: Ambulatory Visit | Attending: Neurology | Admitting: Neurology

## 2011-05-04 DIAGNOSIS — I951 Orthostatic hypotension: Secondary | ICD-10-CM

## 2011-05-04 DIAGNOSIS — R269 Unspecified abnormalities of gait and mobility: Secondary | ICD-10-CM

## 2011-05-04 DIAGNOSIS — M542 Cervicalgia: Secondary | ICD-10-CM

## 2011-05-04 DIAGNOSIS — G219 Secondary parkinsonism, unspecified: Secondary | ICD-10-CM

## 2011-05-04 MED ORDER — GADOBENATE DIMEGLUMINE 529 MG/ML IV SOLN
15.0000 mL | Freq: Once | INTRAVENOUS | Status: AC | PRN
  Administered 2011-05-04: 14 mL via INTRAVENOUS

## 2012-01-31 ENCOUNTER — Inpatient Hospital Stay (HOSPITAL_COMMUNITY)
Admission: EM | Admit: 2012-01-31 | Discharge: 2012-02-02 | DRG: 287 | Disposition: A | Discharge status: Home / Self Care | Payer: Medicare Other | Attending: Internal Medicine | Admitting: Internal Medicine

## 2012-01-31 ENCOUNTER — Ambulatory Visit
Admission: RE | Admit: 2012-01-31 | Discharge: 2012-01-31 | Disposition: A | Discharge status: Home / Self Care | Payer: Medicare Other | Source: Ambulatory Visit | Attending: Geriatric Medicine | Admitting: Geriatric Medicine

## 2012-01-31 ENCOUNTER — Encounter (HOSPITAL_COMMUNITY): Payer: Self-pay | Admitting: Emergency Medicine

## 2012-01-31 ENCOUNTER — Emergency Department (HOSPITAL_COMMUNITY): Payer: Medicare Other

## 2012-01-31 ENCOUNTER — Other Ambulatory Visit: Payer: Self-pay | Admitting: Geriatric Medicine

## 2012-01-31 DIAGNOSIS — N390 Urinary tract infection, site not specified: Secondary | ICD-10-CM | POA: Diagnosis not present

## 2012-01-31 DIAGNOSIS — R9431 Abnormal electrocardiogram [ECG] [EKG]: Secondary | ICD-10-CM

## 2012-01-31 DIAGNOSIS — G10 Huntington's disease: Secondary | ICD-10-CM | POA: Diagnosis present

## 2012-01-31 DIAGNOSIS — I951 Orthostatic hypotension: Secondary | ICD-10-CM | POA: Diagnosis present

## 2012-01-31 DIAGNOSIS — I248 Other forms of acute ischemic heart disease: Secondary | ICD-10-CM | POA: Diagnosis present

## 2012-01-31 DIAGNOSIS — I2489 Other forms of acute ischemic heart disease: Secondary | ICD-10-CM | POA: Diagnosis present

## 2012-01-31 DIAGNOSIS — R109 Unspecified abdominal pain: Secondary | ICD-10-CM

## 2012-01-31 DIAGNOSIS — K838 Other specified diseases of biliary tract: Secondary | ICD-10-CM

## 2012-01-31 DIAGNOSIS — Z7982 Long term (current) use of aspirin: Secondary | ICD-10-CM

## 2012-01-31 DIAGNOSIS — I4891 Unspecified atrial fibrillation: Principal | ICD-10-CM

## 2012-01-31 DIAGNOSIS — Z87891 Personal history of nicotine dependence: Secondary | ICD-10-CM

## 2012-01-31 DIAGNOSIS — E039 Hypothyroidism, unspecified: Secondary | ICD-10-CM

## 2012-01-31 DIAGNOSIS — F329 Major depressive disorder, single episode, unspecified: Secondary | ICD-10-CM | POA: Diagnosis present

## 2012-01-31 DIAGNOSIS — K219 Gastro-esophageal reflux disease without esophagitis: Secondary | ICD-10-CM | POA: Diagnosis present

## 2012-01-31 DIAGNOSIS — F419 Anxiety disorder, unspecified: Secondary | ICD-10-CM

## 2012-01-31 DIAGNOSIS — Z79899 Other long term (current) drug therapy: Secondary | ICD-10-CM

## 2012-01-31 DIAGNOSIS — F411 Generalized anxiety disorder: Secondary | ICD-10-CM

## 2012-01-31 DIAGNOSIS — R778 Other specified abnormalities of plasma proteins: Secondary | ICD-10-CM | POA: Diagnosis not present

## 2012-01-31 HISTORY — DX: Major depressive disorder, single episode, unspecified: F32.9

## 2012-01-31 HISTORY — DX: Depression, unspecified: F32.A

## 2012-01-31 HISTORY — DX: Anxiety disorder, unspecified: F41.9

## 2012-01-31 LAB — URINALYSIS, ROUTINE W REFLEX MICROSCOPIC
Bilirubin Urine: NEGATIVE
Glucose, UA: NEGATIVE mg/dL
Ketones, ur: 15 mg/dL — AB
Leukocytes, UA: NEGATIVE
pH: 7 (ref 5.0–8.0)

## 2012-01-31 LAB — COMPREHENSIVE METABOLIC PANEL
BUN: 16 mg/dL (ref 6–23)
CO2: 21 mEq/L (ref 19–32)
Calcium: 9.1 mg/dL (ref 8.4–10.5)
Creatinine, Ser: 0.85 mg/dL (ref 0.50–1.10)
GFR calc Af Amer: 79 mL/min — ABNORMAL LOW (ref >90)
GFR calc non Af Amer: 68 mL/min — ABNORMAL LOW (ref >90)
Glucose, Bld: 70 mg/dL (ref 70–99)

## 2012-01-31 LAB — CARDIAC PANEL(CRET KIN+CKTOT+MB+TROPI)
CK, MB: 6.9 ng/mL (ref 0.3–4.0)
Relative Index: 5.2 — ABNORMAL HIGH (ref 0.0–2.5)
Troponin I: 0.99 ng/mL (ref <0.30)

## 2012-01-31 LAB — CBC
HCT: 41.5 % (ref 36.0–46.0)
MCHC: 33.5 g/dL (ref 30.0–36.0)
MCV: 96.3 fL (ref 78.0–100.0)
RDW: 15.7 % — ABNORMAL HIGH (ref 11.5–15.5)

## 2012-01-31 LAB — TROPONIN I: Troponin I: <0.3 ng/mL (ref <0.30)

## 2012-01-31 MED ORDER — CLONAZEPAM 0.5 MG PO TABS
0.5000 mg | ORAL_TABLET | Freq: Every evening | ORAL | Status: DC | PRN
  Filled 2012-01-31: qty 1

## 2012-01-31 MED ORDER — MIDODRINE HCL 5 MG PO TABS
10.0000 mg | ORAL_TABLET | Freq: Three times a day (TID) | ORAL | Status: DC
  Administered 2012-02-01 – 2012-02-02 (×5): 10 mg via ORAL
  Filled 2012-01-31 (×7): qty 2

## 2012-01-31 MED ORDER — ONDANSETRON HCL 4 MG/2ML IJ SOLN
INTRAMUSCULAR | Status: AC
  Filled 2012-01-31: qty 2

## 2012-01-31 MED ORDER — SODIUM CHLORIDE 0.9 % IV SOLN
INTRAVENOUS | Status: AC
  Administered 2012-01-31: 22:00:00 via INTRAVENOUS

## 2012-01-31 MED ORDER — ACETAMINOPHEN 650 MG RE SUPP: 650.0000 mg | Freq: Four times a day (QID) | RECTAL | Status: DC | PRN

## 2012-01-31 MED ORDER — IOHEXOL 300 MG/ML  SOLN
30.0000 mL | Freq: Once | INTRAMUSCULAR | Status: AC | PRN
  Administered 2012-01-31: 30 mL via ORAL

## 2012-01-31 MED ORDER — PAROXETINE HCL 20 MG PO TABS: 40.0000 mg | ORAL_TABLET | ORAL | Status: DC

## 2012-01-31 MED ORDER — FLUDROCORTISONE ACETATE 0.1 MG PO TABS
0.3000 mg | ORAL_TABLET | Freq: Every day | ORAL | Status: DC
  Administered 2012-02-01 – 2012-02-02 (×2): 0.3 mg via ORAL
  Filled 2012-01-31 (×2): qty 3

## 2012-01-31 MED ORDER — HEPARIN BOLUS VIA INFUSION
4000.0000 [IU] | Freq: Once | INTRAVENOUS | Status: AC
  Administered 2012-02-01: 4000 [IU] via INTRAVENOUS
  Filled 2012-01-31: qty 4000

## 2012-01-31 MED ORDER — PAROXETINE HCL 20 MG PO TABS
40.0000 mg | ORAL_TABLET | Freq: Every day | ORAL | Status: DC
  Filled 2012-01-31: qty 2

## 2012-01-31 MED ORDER — ACETAMINOPHEN 325 MG PO TABS
650.0000 mg | ORAL_TABLET | Freq: Four times a day (QID) | ORAL | Status: DC | PRN
  Administered 2012-02-01: 650 mg via ORAL
  Filled 2012-01-31: qty 2

## 2012-01-31 MED ORDER — IOHEXOL 300 MG/ML  SOLN
100.0000 mL | Freq: Once | INTRAMUSCULAR | Status: AC | PRN
  Administered 2012-01-31: 100 mL via INTRAVENOUS

## 2012-01-31 MED ORDER — TETRABENAZINE 12.5 MG PO TABS
12.5000 mg | ORAL_TABLET | Freq: Two times a day (BID) | ORAL | Status: DC
Start: 2012-01-31 — End: 2012-02-02
  Administered 2012-01-31 – 2012-02-02 (×4): 12.5 mg via ORAL

## 2012-01-31 MED ORDER — ONDANSETRON HCL 4 MG/2ML IJ SOLN: 4.0000 mg | Freq: Four times a day (QID) | INTRAMUSCULAR | Status: DC | PRN

## 2012-01-31 MED ORDER — ONDANSETRON HCL 4 MG PO TABS
4.0000 mg | ORAL_TABLET | Freq: Four times a day (QID) | ORAL | Status: DC | PRN
  Administered 2012-02-02: 07:00:00 4 mg via ORAL
  Filled 2012-01-31: qty 1

## 2012-01-31 MED ORDER — BUPROPION HCL ER (XL) 300 MG PO TB24
450.0000 mg | ORAL_TABLET | Freq: Every day | ORAL | Status: DC
  Administered 2012-02-01 – 2012-02-02 (×2): 450 mg via ORAL
  Filled 2012-01-31 (×2): qty 1

## 2012-01-31 MED ORDER — SODIUM CHLORIDE 0.9 % IJ SOLN: 3.0000 mL | Freq: Two times a day (BID) | INTRAMUSCULAR | Status: DC

## 2012-01-31 MED ORDER — HEPARIN (PORCINE) IN NACL 100-0.45 UNIT/ML-% IJ SOLN
1100.0000 [IU]/h | INTRAMUSCULAR | Status: DC
  Administered 2012-02-01: 1100 [IU]/h via INTRAVENOUS
  Filled 2012-01-31 (×3): qty 250

## 2012-01-31 MED ORDER — MIDODRINE HCL 5 MG PO TABS: 10.0000 mg | ORAL_TABLET | Freq: Three times a day (TID) | ORAL | Status: DC

## 2012-01-31 NOTE — ED Provider Notes (Signed)
History     CSN: [id number]  Arrival date & time 01/31/12  1513   First MD Initiated Contact with Patient 01/31/12 1542      Chief Complaint  Patient presents with  . Chest Pain    (Consider location/radiation/quality/duration/timing/severity/associated sxs/prior treatment) Patient is a 71 y.o. female presenting with palpitations. The history is provided by the patient.  Palpitations  This is a new problem. The current episode started 1 to 2 hours ago. The problem occurs constantly. The problem has not changed since onset.The problem is associated with an unknown factor. Associated symptoms include chest pressure, irregular heartbeat, near-syncope and shortness of breath. Pertinent negatives include no diaphoresis, no fever, no numbness, no chest pain, no exertional chest pressure, no syncope, no abdominal pain, no nausea, no vomiting, no headaches, no back pain, no dizziness, no weakness and no cough. She has tried nothing for the symptoms. Risk factors include post menopause. Her past medical history does not include anemia, heart disease or hyperthyroidism.    Past Medical History  Diagnosis Date  . Depression   . Anxiety     Past Surgical History  Procedure Date  . Back surgery   . Abdominal hysterectomy   . Breast enhancement surgery   . Hammer toe surgery     History reviewed. No pertinent family history.  History  Substance Use Topics  . Smoking status: Former Games developer  . Smokeless tobacco: Not on file   Comment: quit 15 years ago  . Alcohol Use: No    OB History    Grav Para Term Preterm Abortions TAB SAB Ect Mult Living                  Review of Systems  Constitutional: Negative for fever, chills and diaphoresis.  Respiratory: Positive for shortness of breath. Negative for cough.   Cardiovascular: Positive for palpitations and near-syncope. Negative for chest pain and syncope.  Gastrointestinal: Negative for nausea, vomiting and abdominal pain.    Musculoskeletal: Negative for back pain.  Skin: Negative for color change and rash.  Neurological: Positive for light-headedness. Negative for dizziness, syncope, weakness, numbness and headaches.  All other systems reviewed and are negative.    Allergies  Other  Home Medications   Current Outpatient Rx  Name Route Sig Dispense Refill  . ASPIRIN 81 MG PO CHEW Oral Chew 81 mg by mouth daily.    . BUPROPION HCL ER (XL) 150 MG PO TB24 Oral Take 450 mg by mouth daily.    Marland Kitchen CIPROFLOXACIN HCL 250 MG PO TABS Oral Take 250 mg by mouth 2 (two) times daily. Starting 01/30/12 for 3 days    . CLONAZEPAM 0.5 MG PO TABS Oral Take 0.5 mg by mouth at bedtime as needed. For sleep    . FLUDROCORTISONE ACETATE 0.1 MG PO TABS Oral Take 0.3 mg by mouth daily.    Marland Kitchen MIDODRINE HCL 10 MG PO TABS Oral Take 10 mg by mouth 3 (three) times daily.    Marland Kitchen PAROXETINE HCL 40 MG PO TABS Oral Take 40 mg by mouth every morning.    Marland Kitchen PROMETHAZINE HCL 25 MG PO TABS Oral Take 25 mg by mouth every 6 (six) hours as needed. For nausea    . TETRABENAZINE 12.5 MG PO TABS Oral Take 12.5 mg by mouth 2 (two) times daily.      BP 100/56  Pulse 119  Temp 98.3 F (36.8 C)  Resp 15  SpO2 98%  Physical Exam  Nursing note  and vitals reviewed. Constitutional: She is oriented to person, place, and time. She appears well-developed and well-nourished.  HENT:  Head: Normocephalic and atraumatic.  Eyes: Pupils are equal, round, and reactive to light.  Cardiovascular: Normal rate, regular rhythm, normal heart sounds and intact distal pulses.   Pulmonary/Chest: Effort normal and breath sounds normal. No respiratory distress.  Abdominal: Soft. Bowel sounds are normal. She exhibits no distension. There is tenderness (moderate, LLQ). There is no rebound and no guarding.  Musculoskeletal: She exhibits no edema.  Neurological: She is alert and oriented to person, place, and time.  Skin: Skin is warm and dry.  Psychiatric: She has a normal  mood and affect.    ED Course  Procedures (including critical care time)  Labs Reviewed  CBC - Abnormal; Notable for the following:    RDW 15.7 (*)     All other components within normal limits  COMPREHENSIVE METABOLIC PANEL - Abnormal; Notable for the following:    Potassium 3.1 (*)     GFR calc non Af Amer 68 (*)     GFR calc Af Amer 79 (*)     All other components within normal limits  URINALYSIS, ROUTINE W REFLEX MICROSCOPIC - Abnormal; Notable for the following:    Ketones, ur 15 (*)     All other components within normal limits  TROPONIN I  TSH  T4, FREE  CARDIAC PANEL(CRET KIN+CKTOT+MB+TROPI)  COMPREHENSIVE METABOLIC PANEL  CBC WITH DIFFERENTIAL  TSH  HEPARIN LEVEL (UNFRACTIONATED)  CBC  CARDIAC PANEL(CRET KIN+CKTOT+MB+TROPI)  MAGNESIUM   Dg Chest 2 View  01/31/2012  *RADIOLOGY REPORT*  Clinical Data: Mild chest pain.  Shortness of breath. Palpitations.  Hypertension.  CHEST - 2 VIEW  Comparison: Two-view chest 11/12/2080 at [facility].  Findings: The heart size is normal.  Emphysematous changes are evident.  No focal airspace disease is present.  Elevation of the right hemidiaphragm is new.  IMPRESSION:  1.  Emphysema. 2.  Interval increase in elevation of the right hemidiaphragm.  Original Report Authenticated By: Jamesetta Orleans. MATTERN, M.D.      Date: 01/31/2012 @ 1525  Rate: 125  Rhythm: atrial fibrillation  QRS Axis: normal  Intervals: normal  ST/T Wave abnormalities: ST depressions inferiorly and ST depressions laterally V3-V6, II, III, aVF  Conduction Disutrbances:none  Narrative Interpretation:   Old EKG Reviewed: none available   Date: 01/31/2012 @1533   Rate: 78  Rhythm: normal sinus rhythm  QRS Axis: normal  Intervals: normal  ST/T Wave abnormalities: ST depressions inferiorly and ST depressions laterally V3-V6, II, III, aVF  Conduction Disutrbances:none  Narrative Interpretation:   Old EKG Reviewed: changes noted now in NSR     1.  Atrial fibrillation with RVR   2. Abnormal EKG   3. Abdominal pain       MDM  This is a 71 year old female who presents here from home. She states that shortly before arrival she began feeling like her heart was racing, and like she was going to pass out. This was accompanied by some mild chest pressure and shortness of breath. When EMS arrived she was found to be in A. fib with RVR, and was given a fluid bolus. The patient spontaneously converted to normal sinus rhythm while in the ED. After she converted she states that her chest pressure and shortness of breath have resolved. She denies any past history of dysrhythmias, or any known family history of dysrhythmias, or any previous problems with her heart. She has had several  previous episodes of feeling like her heart is racing and like she was going to pass out, but those resolved on their own; she has never actually passed out. The patient notes that she recently started a protein diet in attempts to lose weight, and since then has had increasing gas, nausea and abdominal pain worse in her lower abdomen. She has had a couple episodes of vomiting, but denies any constipation or diarrhea. Review of records show that she was for a CT scan by her regular doctor which is slightly prominent common bile duct but no other acute findings. She has moderate tenderness to her left lower quadrant, but the exam is otherwise unremarkable. At this time she is in sinus rhythm. We will check lab work today for possible underlying etiology of her new onset A. fib and continue to monitor her.   The patient has remained in normal sinus rhythm, and has been symptom-free. Her blood work is so far unremarkable. We'll admit the patient for further evaluation and management. The patient and her family were updated with this plan.     Theotis Burrow, MD 02/01/12 [phone]

## 2012-01-31 NOTE — ED Notes (Signed)
Per EMS pt with general ill complaints for 3 weeks today returned from CT scan and began having fatigue, sensation of "passing out", chest tightness, and sod. On ems arrival pt was pale and hypotensive, bolus given, zofran 4mg , and 325 ASA. Pt arrives pale alert x4 slow to respond to questions. Speech clear, MAE randomly, skin warm/ dry. Per ems pt took po phenergan and 2 Klonopin pta.

## 2012-01-31 NOTE — Progress Notes (Signed)
ANTICOAGULATION CONSULT NOTE - Initial Consult  Pharmacy Consult for heparin Indication: chest pain/ACS and atrial fibrillation  Allergies  Allergen Reactions  . 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    Patient Measurements: Height: 5' 10.8" (179.8 cm) Weight: 170 lb 10.2 oz (77.4 kg) IBW/kg (Calculated) : 70.34  Heparin Dosing Weight: 77kg  Vital Signs: Temp: 98.3 F (36.8 C) (07/18 1526) Temp src: Oral (07/18 2124) BP: 127/81 mmHg (07/18 2124) Pulse Rate: 75  (07/18 2124)  Labs:  Basename 01/31/12 2145 01/31/12 1614 01/31/12 1613  HGB -- 13.9 --  HCT -- 41.5 --  PLT -- 297 --  APTT -- -- --  LABPROT -- -- --  INR -- -- --  HEPARINUNFRC -- -- --  CREATININE -- 0.85 --  CKTOTAL 133 -- --  CKMB 6.9* -- --  TROPONINI 0.99* -- <0.30    Estimated Creatinine Clearance: 69.3 ml/min (by C-G formula based on Cr of 0.85).   Medical History: Past Medical History  Diagnosis Date  . Depression   . Anxiety     Medications:  Prescriptions prior to admission  Medication Sig Dispense Refill  . aspirin 81 MG chewable tablet Chew 81 mg by mouth daily.      Marland Kitchen buPROPion (WELLBUTRIN XL) 150 MG 24 hr tablet Take 450 mg by mouth daily.      . ciprofloxacin (CIPRO) 250 MG tablet Take 250 mg by mouth 2 (two) times daily. Starting 01/30/12 for 3 days      . clonazePAM (KLONOPIN) 0.5 MG tablet Take 0.5 mg by mouth at bedtime as needed. For sleep      . fludrocortisone (FLORINEF) 0.1 MG tablet Take 0.3 mg by mouth daily.      . midodrine (PROAMATINE) 10 MG tablet Take 10 mg by mouth 3 (three) times daily.      Marland Kitchen PARoxetine (PAXIL) 40 MG tablet Take 40 mg by mouth every morning.      . promethazine (PHENERGAN) 25 MG tablet Take 25 mg by mouth every 6 (six) hours as needed. For nausea      . tetrabenazine (XENAZINE) 12.5 MG tablet Take 12.5 mg by mouth 2 (two) times daily.       Scheduled:    . buPROPion  450 mg Oral Daily  . fludrocortisone  0.3 mg Oral Daily  .  heparin  4,000 Units Intravenous Once  . midodrine  10 mg Oral TID WC  . ondansetron      . PARoxetine  40 mg Oral Daily  . sodium chloride  3 mL Intravenous Q12H  . tetrabenazine  12.5 mg Oral BID  . DISCONTD: midodrine  10 mg Oral TID  . DISCONTD: PARoxetine  40 mg Oral [id number]    Assessment: 71yo female c/o mild CP and SOB with a feeling like her heart was racing, found by EMS to be in Afib with RVR, spontaneously converted to NSR in ED, CP/SOB then resolved, now to begin heparin for CP and new-onset Afib.  Goal of Therapy:  Heparin level 0.3-0.7 units/ml Monitor platelets by anticoagulation protocol: Yes   Plan:  Will give heparin bolus of 4000 units x1 followed by gtt at 1100 units/hr and monitor heparin levels and CBC.  Colleen Can PharmD BCPS 01/31/2012,11:49 PM

## 2012-02-01 ENCOUNTER — Encounter (HOSPITAL_COMMUNITY)
Admission: EM | Disposition: A | Discharge status: Home / Self Care | Payer: Self-pay | Source: Home / Self Care | Attending: Internal Medicine

## 2012-02-01 ENCOUNTER — Inpatient Hospital Stay (HOSPITAL_COMMUNITY): Payer: Medicare Other

## 2012-02-01 DIAGNOSIS — R778 Other specified abnormalities of plasma proteins: Secondary | ICD-10-CM | POA: Diagnosis not present

## 2012-02-01 HISTORY — PX: LEFT HEART CATHETERIZATION WITH CORONARY ANGIOGRAM: SHX5451

## 2012-02-01 LAB — COMPREHENSIVE METABOLIC PANEL
Albumin: 3.5 g/dL (ref 3.5–5.2)
BUN: 15 mg/dL (ref 6–23)
Chloride: 111 mEq/L (ref 96–112)
Creatinine, Ser: 0.86 mg/dL (ref 0.50–1.10)
GFR calc Af Amer: 78 mL/min — ABNORMAL LOW (ref >90)
Glucose, Bld: 92 mg/dL (ref 70–99)
Total Bilirubin: 0.3 mg/dL (ref 0.3–1.2)

## 2012-02-01 LAB — CARDIAC PANEL(CRET KIN+CKTOT+MB+TROPI)
CK, MB: 5.9 ng/mL — ABNORMAL HIGH (ref 0.3–4.0)
CK, MB: 3.7 ng/mL (ref 0.3–4.0)
Relative Index: INVALID (ref 0.0–2.5)
Troponin I: 0.5 ng/mL (ref <0.30)
Troponin I: <0.3 ng/mL (ref <0.30)

## 2012-02-01 LAB — CBC WITH DIFFERENTIAL/PLATELET
Basophils Absolute: 0.1 10*3/uL (ref 0.0–0.1)
Eosinophils Relative: 7 % — ABNORMAL HIGH (ref 0–5)
Lymphocytes Relative: 33 % (ref 12–46)
Lymphs Abs: 2.3 10*3/uL (ref 0.7–4.0)
MCV: 97.5 fL (ref 78.0–100.0)
Neutro Abs: 3.4 10*3/uL (ref 1.7–7.7)
Neutrophils Relative %: 50 % (ref 43–77)
Platelets: 290 10*3/uL (ref 150–400)
RBC: 4.06 MIL/uL (ref 3.87–5.11)
WBC: 6.9 10*3/uL (ref 4.0–10.5)

## 2012-02-01 LAB — CBC
Platelets: 275 10*3/uL (ref 150–400)
RBC: 3.97 MIL/uL (ref 3.87–5.11)
RDW: 16.3 % — ABNORMAL HIGH (ref 11.5–15.5)
WBC: 6.9 10*3/uL (ref 4.0–10.5)

## 2012-02-01 LAB — GLUCOSE, CAPILLARY: Glucose-Capillary: 92 mg/dL (ref 70–99)

## 2012-02-01 LAB — TSH: TSH: 3.554 u[IU]/mL (ref 0.350–4.500)

## 2012-02-01 LAB — MAGNESIUM: Magnesium: 2.3 mg/dL (ref 1.5–2.5)

## 2012-02-01 SURGERY — LEFT HEART CATHETERIZATION WITH CORONARY ANGIOGRAM

## 2012-02-01 SURGERY — LEFT HEART CATHETERIZATION WITH CORONARY ANGIOGRAM
Anesthesia: LOCAL

## 2012-02-01 MED ORDER — HEPARIN SODIUM (PORCINE) 1000 UNIT/ML IJ SOLN
INTRAMUSCULAR | Status: AC
  Filled 2012-02-01: qty 1

## 2012-02-01 MED ORDER — LIDOCAINE HCL (PF) 1 % IJ SOLN
INTRAMUSCULAR | Status: AC
  Filled 2012-02-01: qty 30

## 2012-02-01 MED ORDER — FENTANYL CITRATE 0.05 MG/ML IJ SOLN
INTRAMUSCULAR | Status: AC
  Filled 2012-02-01: qty 2

## 2012-02-01 MED ORDER — PAROXETINE HCL 20 MG PO TABS
40.0000 mg | ORAL_TABLET | Freq: Every day | ORAL | Status: DC
  Administered 2012-02-01: 40 mg via ORAL
  Filled 2012-02-01 (×2): qty 2

## 2012-02-01 MED ORDER — HEPARIN (PORCINE) IN NACL 100-0.45 UNIT/ML-% IJ SOLN
1000.0000 [IU]/h | INTRAMUSCULAR | Status: DC
  Filled 2012-02-01: qty 250

## 2012-02-01 MED ORDER — POTASSIUM CHLORIDE CRYS ER 20 MEQ PO TBCR
40.0000 meq | EXTENDED_RELEASE_TABLET | Freq: Once | ORAL | Status: AC
  Administered 2012-02-01: 40 meq via ORAL
  Filled 2012-02-01 (×2): qty 2

## 2012-02-01 MED ORDER — MIDAZOLAM HCL 2 MG/2ML IJ SOLN
INTRAMUSCULAR | Status: AC
  Filled 2012-02-01: qty 2

## 2012-02-01 MED ORDER — LEVOTHYROXINE SODIUM 75 MCG PO TABS
75.0000 ug | ORAL_TABLET | Freq: Every day | ORAL | Status: DC
  Administered 2012-02-01 – 2012-02-02 (×2): 75 ug via ORAL
  Filled 2012-02-01 (×3): qty 1

## 2012-02-01 MED ORDER — ACETAMINOPHEN 325 MG PO TABS
650.0000 mg | ORAL_TABLET | ORAL | Status: DC | PRN
  Administered 2012-02-02: 07:00:00 650 mg via ORAL
  Filled 2012-02-01: qty 2

## 2012-02-01 MED ORDER — HEPARIN (PORCINE) IN NACL 2-0.9 UNIT/ML-% IJ SOLN
INTRAMUSCULAR | Status: AC
  Filled 2012-02-01: qty 2000

## 2012-02-01 MED ORDER — ASPIRIN EC 325 MG PO TBEC
325.0000 mg | DELAYED_RELEASE_TABLET | Freq: Every day | ORAL | Status: DC
  Administered 2012-02-01 – 2012-02-02 (×2): 325 mg via ORAL
  Filled 2012-02-01 (×2): qty 1

## 2012-02-01 MED ORDER — OXYCODONE-ACETAMINOPHEN 5-325 MG PO TABS
1.0000 | ORAL_TABLET | ORAL | Status: DC | PRN
  Administered 2012-02-01 – 2012-02-02 (×4): 1 via ORAL
  Filled 2012-02-01 (×4): qty 1

## 2012-02-01 MED ORDER — ONDANSETRON HCL 4 MG/2ML IJ SOLN: 4.0000 mg | Freq: Four times a day (QID) | INTRAMUSCULAR | Status: DC | PRN

## 2012-02-01 MED ORDER — VERAPAMIL HCL 2.5 MG/ML IV SOLN
INTRAVENOUS | Status: AC
  Filled 2012-02-01: qty 2

## 2012-02-01 MED ORDER — CLONAZEPAM 0.5 MG PO TABS
0.5000 mg | ORAL_TABLET | Freq: Every day | ORAL | Status: DC
  Administered 2012-02-01: 21:00:00 0.5 mg via ORAL

## 2012-02-01 MED ORDER — NITROGLYCERIN 0.2 MG/ML ON CALL CATH LAB
INTRAVENOUS | Status: AC
  Filled 2012-02-01: qty 1

## 2012-02-01 MED ORDER — SODIUM CHLORIDE 0.9 % IV SOLN
1.0000 mL/kg/h | INTRAVENOUS | Status: AC
  Administered 2012-02-01: 1 mL/kg/h via INTRAVENOUS

## 2012-02-01 NOTE — Progress Notes (Signed)
ANTICOAGULATION CONSULT NOTE - Follow Up Consult  Pharmacy Consult for Heparin Indication: chest pain/ACS and atrial fibrillation  Allergies  Allergen Reactions  . 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  Medications:  Heparin @ 1100 units/hr  Assessment: 70yof with new onset afib and positive cardiac enzymes continues on heparin. Initial heparin level (drawn ~ 2 hours early) is just slightly above goal.  No bleeding per patient. CBC stable. Cardiology consult pending.  Goal of Therapy:  Heparin level 0.3-0.7 units/ml Monitor platelets by anticoagulation protocol: Yes   Plan:  1) Decrease heparin rate to 1000 units/hr 2) Check another heparin level in 6 hours  Fredrik Rigger 02/01/2012,8:32 AM

## 2012-02-01 NOTE — H&P (Signed)
Melinda [name] is an 71 y.o. female.   PCP - Dr.John Valentina Lucks. Chief Complaint: Palpitations. HPI: 71 year-old female with history of hypothyroidism, depression and anxiety and chronic orthostatic hypotension has been experiencing nausea for last few weeks which has worsened over the last 48 hours and had gone to her PCPs office and had CT abdomen pelvis done last afternoon. One hour after that patient while at home started developing palpitations and generalized uneasiness. Patient took a couple of pills of Klonopin. The symptoms last for almost an hour and at that point the call EMS and patient was brought to the ER. In the ER patient was found to be in atrial fibrillation with RVR. Patient's atrial fibrillation converted to sinus rhythm spontaneously. Her EKG taken later shows ST depression in lateral and inferior leads. This resolved in the next EKG. Patient states she has been having some epigastric discomfort but denies any chest pain shortness of breath. Of recent she has been taking herbal medications to lose weight. When I examined the patient patient was wanting to eat something. While dictating this history and physical patient's cardiac enzymes second set has turned positive. Patient at this time is taking rest and has no chest pain or shortness of breath.  Past Medical History  Diagnosis Date  . Depression   . Anxiety     Past Surgical History  Procedure Date  . Back surgery   . Abdominal hysterectomy   . Breast enhancement surgery   . Hammer toe surgery     History reviewed. No pertinent family history. Social History:  reports that she has quit smoking. She does not have any smokeless tobacco history on file. She reports that she does not drink alcohol or use illicit drugs.  Allergies:  Allergies  Allergen Reactions  . 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 aspirin 81 MG chewable tablet  Chew 81 mg by mouth daily.      Marland Kitchen buPROPion (WELLBUTRIN XL) 150 MG 24 hr tablet Take 450 mg by mouth daily.      . ciprofloxacin (CIPRO) 250 MG tablet Take 250 mg by mouth 2 (two) times daily. Starting 01/30/12 for 3 days      . clonazePAM (KLONOPIN) 0.5 MG tablet Take 0.5 mg by mouth at bedtime as needed. For sleep      . fludrocortisone (FLORINEF) 0.1 MG tablet Take 0.3 mg by mouth daily.      . midodrine (PROAMATINE) 10 MG tablet Take 10 mg by mouth 3 (three) times daily.      Marland Kitchen PARoxetine (PAXIL) 40 MG tablet Take 40 mg by mouth every morning.      . promethazine (PHENERGAN) 25 MG tablet Take 25 mg by mouth every 6 (six) hours as needed. For nausea      . tetrabenazine (XENAZINE) 12.5 MG tablet Take 12.5 mg by mouth 2 (two) times daily.        Results for orders placed during the hospital encounter of 01/31/12 (from the past 48 hour(s))  TROPONIN I     Status: Normal   Collection Time   01/31/12  4:13 PM      Component Value Range Comment   Troponin I <0.30  <0.30 ng/mL   CBC     Status: Abnormal   Collection Time   01/31/12  4:14 PM      Component Value Range Comment   WBC  8.3  4.0 - 10.5 K/uL    RBC 4.31  3.87 - 5.11 MIL/uL    Hemoglobin 13.9  12.0 - 15.0 g/dL    HCT 16.1  09.6 - 04.5 %    MCV 96.3  78.0 - 100.0 fL    MCH 32.3  26.0 - 34.0 pg    MCHC 33.5  30.0 - 36.0 g/dL    RDW 40.9 (*) 81.1 - 15.5 %    Platelets 297  150 - 400 K/uL   COMPREHENSIVE METABOLIC PANEL     Status: Abnormal   Collection Time   01/31/12  4:14 PM      Component Value Range Comment   Sodium 138  135 - 145 mEq/L    Potassium 3.1 (*) 3.5 - 5.1 mEq/L    Chloride 103  96 - 112 mEq/L    CO2 21  19 - 32 mEq/L    Glucose, Bld 70  70 - 99 mg/dL    BUN 16  6 - 23 mg/dL    Creatinine, Ser 9.14  0.50 - 1.10 mg/dL    Calcium 9.1  8.4 - 78.2 mg/dL    Total Protein 6.8  6.0 - 8.3 g/dL    Albumin 4.0  3.5 - 5.2 g/dL    AST 23  0 - 37 U/L    ALT 20  0 - 35 U/L    Alkaline Phosphatase 80  39 - 117 U/L     Total Bilirubin 0.4  0.3 - 1.2 mg/dL    GFR calc non Af Amer 68 (*) >90 mL/min    GFR calc Af Amer 79 (*) >90 mL/min   URINALYSIS, ROUTINE W REFLEX MICROSCOPIC     Status: Abnormal   Collection Time   01/31/12  4:18 PM      Component Value Range Comment   Color, Urine YELLOW  YELLOW    APPearance CLEAR  CLEAR    Specific Gravity, Urine 1.017  1.005 - 1.030    pH 7.0  5.0 - 8.0    Glucose, UA NEGATIVE  NEGATIVE mg/dL    Hgb urine dipstick NEGATIVE  NEGATIVE    Bilirubin Urine NEGATIVE  NEGATIVE    Ketones, ur 15 (*) NEGATIVE mg/dL    Protein, ur NEGATIVE  NEGATIVE mg/dL    Urobilinogen, UA 0.2  0.0 - 1.0 mg/dL    Nitrite NEGATIVE  NEGATIVE    Leukocytes, UA NEGATIVE  NEGATIVE MICROSCOPIC NOT DONE ON URINES WITH NEGATIVE PROTEIN, BLOOD, LEUKOCYTES, NITRITE, OR GLUCOSE <1000 mg/dL.  CARDIAC PANEL(CRET KIN+CKTOT+MB+TROPI)     Status: Abnormal   Collection Time   01/31/12  9:45 PM      Component Value Range Comment   Total CK 133  7 - 177 U/L    CK, MB 6.9 (*) 0.3 - 4.0 ng/mL    Troponin I 0.99 (*) <0.30 ng/mL    Relative Index 5.2 (*) 0.0 - 2.5    Dg Chest 2 View  01/31/2012  *RADIOLOGY REPORT*  Clinical Data: Mild chest pain.  Shortness of breath. Palpitations.  Hypertension.  CHEST - 2 VIEW  Comparison: Two-view chest 11/12/2080 at [facility].  Findings: The heart size is normal.  Emphysematous changes are evident.  No focal airspace disease is present.  Elevation of the right hemidiaphragm is new.  IMPRESSION:  1.  Emphysema. 2.  Interval increase in elevation of the right hemidiaphragm.  Original Report Authenticated By: Jamesetta Orleans. MATTERN, M.D.   Ct Abdomen Pelvis  W Contrast  01/31/2012  *RADIOLOGY REPORT*  Clinical Data: Epigastric and right abdominal pain for 2 weeks  CT ABDOMEN AND PELVIS WITH CONTRAST  Technique:  Multidetector CT imaging of the abdomen and pelvis was performed following the standard protocol during bolus administration of intravenous contrast.   Contrast: 30mL OMNIPAQUE IOHEXOL 300 MG/ML  SOLN, OMNIPAQUE IOHEXOL 300 MG/ML  SOLN  Comparison: None.  Findings: The lung bases are clear. There is dense breast tissue bilaterally.  The liver enhances with no focal abnormality and no ductal dilatation is seen.  No calcified gallstones are seen.  The common bile duct is slightly prominent measuring 7.2 mm, and better seen on coronal images there is suggestion of soft tissue defect within the distal common bile duct.  This could represent noncalcified gallstones or soft tissue mass and correlation with LFTs is recommended.  The pancreas is normal in size and the pancreatic duct is slightly prominent measuring 3.2 mm in diameter. The adrenal glands and spleen are unremarkable.  The stomach is not well distended.  A probable complex exophytic cyst emanates from the upper posterolateral left kidney measuring 2.0 cm in diameter. No other renal lesion is seen and on delayed images, the pelvocaliceal systems are unremarkable with extrarenal pelves bilaterally.  The abdominal aorta is normal in caliber with atheromatous change present.  No adenopathy is seen.  The urinary bladder is not well distended but is unremarkable.  The uterus appears to have been resected.  No adnexal lesion is seen. No fluid is noted within the pelvis.  There are a few scattered rectosigmoid colonic diverticula present.  No other abnormality of the colon is seen.  IMPRESSION:  1.  Slightly prominent common bile duct with questionable filling defects within the distal common bile duct.  Cannot exclude distal common bile duct noncalcified calculi or mass.  Correlate with liver function tests. 2.  Slightly prominent pancreatic duct.  Original Report Authenticated By: Juline Patch, M.D.    Review of Systems  Constitutional: Negative.   HENT: Negative.   Eyes: Negative.   Respiratory: Negative.   Cardiovascular: Positive for palpitations.  Gastrointestinal: Positive for nausea and  abdominal pain (epigastric.).  Genitourinary: Negative.   Musculoskeletal: Negative.   Skin: Negative.   Neurological: Negative.   Endo/Heme/Allergies: Negative.   Psychiatric/Behavioral: Negative.     Blood pressure 127/81, pulse 75, temperature 98.3 F (36.8 C), resp. rate 18, height 5' 10.8" (1.798 m), weight 77.4 kg (170 lb 10.2 oz), SpO2 97.00%. Physical Exam  Constitutional: She is oriented to person, place, and time. She appears well-developed and well-nourished. No distress.  HENT:  Head: Normocephalic and atraumatic.  Right Ear: External ear normal.  Left Ear: External ear normal.  Nose: Nose normal.  Mouth/Throat: Oropharynx is clear and moist. No oropharyngeal exudate.  Eyes: Conjunctivae are normal. Pupils are equal, round, and reactive to light. Right eye exhibits no discharge. Left eye exhibits no discharge. No scleral icterus.  Neck: Normal range of motion. Neck supple.  Cardiovascular: Normal rate and regular rhythm.   Respiratory: Effort normal and breath sounds normal. No respiratory distress. She has no wheezes. She has no rales.  GI: Soft. Bowel sounds are normal. She exhibits no distension. There is no tenderness. There is no rebound.  Musculoskeletal: Normal range of motion. She exhibits no edema and no tenderness.  Neurological: She is alert and oriented to person, place, and time.       Moves all extremities.  Skin: Skin is warm and  dry. She is not diaphoretic.  Psychiatric: Her behavior is normal.     Assessment/Plan #1. Atrial fibrillation with RVR presently in sinus rhythm and rate control - do not know exactly what precipitated the atrial fibrillation. Patient's CHADS2 score is only 1. But since her troponins are coming positive and she had initial EKG which showed ST depression I have started patient on IV heparin. Cycle cardiac markers. Check 2-D echo and thyroid function tests. #2. Positive troponins with EKG showing ST depression - as suggested in #1,  we will cycle cardiac markers continue with IV heparin infusion, aspirin and get a 2-D echo and cardiology consult in a.m. Patient is present chest pain-free. Did not start any beta blockers as patient has history of orthostatic hypotension and usually runs systolic blood pressure in the low range. #3. Nausea with abdominal CT abdomen pelvis - patient's CT abdomen pelvis shows CBD dilation with possible mass or obstruction in the distal part. We'll need further work by gastroenterologist. #4. History of hypothyroidism - check TSH. Continue Synthroid. #5. History of orthostatic hypotension.  CODE STATUS - full code.  Elain Wixon N. 02/01/2012, 12:09 AM

## 2012-02-01 NOTE — Progress Notes (Signed)
TR BAND REMOVAL  LOCATION:  right radial  DEFLATED PER PROTOCOL:  yes  TIME BAND OFF / DRESSING APPLIED:   1645   SITE UPON ARRIVAL:   Level 0  SITE AFTER BAND REMOVAL:  Level 0  REVERSE ALLEN'S TEST:    positive  CIRCULATION SENSATION AND MOVEMENT:  Within Normal Limits  yes  COMMENTS:    

## 2012-02-01 NOTE — Interval H&P Note (Signed)
History and Physical Interval Note:  02/01/2012 2:15 PM  Melinda [name]  has presented today for surgery, with the diagnosis of Chest pain  The various methods of treatment have been discussed with the patient and family. After consideration of risks, benefits and other options for treatment, the patient has consented to  * No procedures listed * as a surgical intervention .  The patient's history has been reviewed, patient examined, no change in status, stable for surgery.  I have reviewed the patient's chart and labs.  Questions were answered to the patient's satisfaction.     Anne Fu, Vielka Klinedinst  See consult note

## 2012-02-01 NOTE — Consult Note (Signed)
Admit date: 01/31/2012 Referring Physician  Dr. Valentina Lucks Primary Physician Lillia Mountain, MD Primary Cardiologist  None Reason for Consultation  Elevated troponin.   HPI: 71 year old female with paroxysmal atrial fibrillation who had an episode last night accompanied with chest tightness, shortness of breath. She has had an extensive workup recently for abdominal pain, right lower quadrant/right upper quadrant pain. She's had history of orthostatic hypotension. She has had a nuclear stress test recently that was low risk with no ischemia. She's had what she says is 9 syncopal episodes in the past.  Her troponin was mildly positive at 0.99 and is decreasing currently. Her MB was also mildly positive at 6.9.  Because of her concurrent chest tightness/elevated troponin in the setting of atrial fibrillation with rapid ventricular response, I discussed with her the possibility of catheterization.    PMH:   Past Medical History  Diagnosis Date  . Depression   . Anxiety     PSH:   Past Surgical History  Procedure Date  . Back surgery   . Abdominal hysterectomy   . Breast enhancement surgery   . Hammer toe surgery    Allergies:  Other Prior to Admit Meds:   Prescriptions prior to admission  Medication Sig Dispense Refill  . aspirin 81 MG chewable tablet Chew 81 mg by mouth daily.      Marland Kitchen buPROPion (WELLBUTRIN XL) 150 MG 24 hr tablet Take 450 mg by mouth daily.      . ciprofloxacin (CIPRO) 250 MG tablet Take 250 mg by mouth 2 (two) times daily. Starting 01/30/12 for 3 days      . clonazePAM (KLONOPIN) 0.5 MG tablet Take 0.5 mg by mouth at bedtime as needed. For sleep      . fludrocortisone (FLORINEF) 0.1 MG tablet Take 0.3 mg by mouth daily.      . midodrine (PROAMATINE) 10 MG tablet Take 10 mg by mouth 3 (three) times daily.      Marland Kitchen PARoxetine (PAXIL) 40 MG tablet Take 40 mg by mouth every morning.      . promethazine (PHENERGAN) 25 MG tablet Take 25 mg by mouth every 6 (six) hours as  needed. For nausea      . tetrabenazine (XENAZINE) 12.5 MG tablet Take 12.5 mg by mouth 2 (two) times daily.       Fam HX:   History reviewed. No pertinent family history. Social HX:    History   Social History  . Marital Status: Divorced    Spouse Name: N/A    Number of Children: N/A  . Years of Education: N/A   Occupational History  . Not on file.   Social History Main Topics  . Smoking status: Former Games developer  . Smokeless tobacco: Not on file   Comment: quit 15 years ago  . Alcohol Use: No  . Drug Use: No  . Sexually Active:    Other Topics Concern  . Not on file   Social History Narrative  . No narrative on file     ROS:  All 11 ROS were addressed and are negative except what is stated in the HPI  Physical Exam: Blood pressure 120/73, pulse 76, temperature 97.7 F (36.5 C), temperature source Oral, resp. rate 18, height 5' 10.8" (1.798 m), weight 77.4 kg (170 lb 10.2 oz), SpO2 93.00%.    General: Well developed, well nourished, in no acute distress Head: Eyes PERRLA, No xanthomas.   Normal cephalic and atramatic  Lungs:   Clear bilaterally to auscultation  and percussion. Normal respiratory effort. No wheezes, no rales. Heart:   HRRR S1 S2 Pulses are 2+ & equal.            No carotid bruit. No JVD.  No abdominal bruits. No femoral bruits. Abdomen: Bowel sounds are positive, abdomen soft and non-tender without masses. No hepatosplenomegaly. Msk:  Back normal. Normal strength and tone for age. Extremities:   No clubbing, cyanosis or edema.  DP +1 Neuro: Alert and oriented X 3, non-focal, MAE x 4 GU: Deferred Rectal: Deferred Psych:  Good affect, responds appropriately    Labs:   Lab Results  Component Value Date   WBC 6.9 02/01/2012   HGB 13.1 02/01/2012   HCT 39.6 02/01/2012   MCV 97.5 02/01/2012   PLT 290 02/01/2012    Lab 02/01/12 0505  NA 143  K 3.9  CL 111  CO2 22  BUN 15  CREATININE 0.86  CALCIUM 9.0  PROT 6.1  BILITOT 0.3  ALKPHOS 73  ALT 19    AST 22  GLUCOSE 92   No results found for this basename: PTT   No results found for this basename: INR, PROTIME   Lab Results  Component Value Date   CKTOTAL 122 02/01/2012   CKMB 5.9* 02/01/2012   TROPONINI 0.50* 02/01/2012     No results found for this basename: CHOL   No results found for this basename: HDL   No results found for this basename: LDLCALC   No results found for this basename: TRIG   No results found for this basename: CHOLHDL   No results found for this basename: LDLDIRECT      Radiology:  Dg Chest 2 View  01/31/2012  *RADIOLOGY REPORT*  Clinical Data: Mild chest pain.  Shortness of breath. Palpitations.  Hypertension.  CHEST - 2 VIEW  Comparison: Two[facility] chest [facility]/12/2080 at [facility].  Findings: The heart size is normal.  Emphysematous changes are evident.  No focal airspace disease is present.  Elevation of the right hemidiaphragm is new.  IMPRESSION:  1.  Emphysema. 2.  Interval increase in elevation of the right hemidiaphragm.  Original Report Authenticated By: Jamesetta Orleans. MATTERN, M.D.   Ct Abdomen Pelvis W Contrast  01/31/2012  *RADIOLOGY REPORT*  Clinical Data: Epigastric and right abdominal pain for 2 weeks  CT ABDOMEN AND PELVIS WITH CONTRAST  Technique:  Multidetector CT imaging of the abdomen and pelvis was performed following the standard protocol during bolus administration of intravenous contrast.  Contrast: 30mL OMNIPAQUE IOHEXOL 300 MG/ML  SOLN, OMNIPAQUE IOHEXOL 300 MG/ML  SOLN  Comparison: None.  Findings: The lung bases are clear. There is dense breast tissue bilaterally.  The liver enhances with no focal abnormality and no ductal dilatation is seen.  No calcified gallstones are seen.  The common bile duct is slightly prominent measuring 7.2 mm, and better seen on coronal images there is suggestion of soft tissue defect within the distal common bile duct.  This could represent noncalcified gallstones or soft tissue mass and  correlation with LFTs is recommended.  The pancreas is normal in size and the pancreatic duct is slightly prominent measuring 3.2 mm in diameter. The adrenal glands and spleen are unremarkable.  The stomach is not well distended.  A probable complex exophytic cyst emanates from the upper posterolateral left kidney measuring 2.0 cm in diameter. No other renal lesion is seen and on delayed images, the pelvocaliceal systems are unremarkable with extrarenal pelves bilaterally.  The abdominal aorta  is normal in caliber with atheromatous change present.  No adenopathy is seen.  The urinary bladder is not well distended but is unremarkable.  The uterus appears to have been resected.  No adnexal lesion is seen. No fluid is noted within the pelvis.  There are a few scattered rectosigmoid colonic diverticula present.  No other abnormality of the colon is seen.  IMPRESSION:  1.  Slightly prominent common bile duct with questionable filling defects within the distal common bile duct.  Cannot exclude distal common bile duct noncalcified calculi or mass.  Correlate with liver function tests. 2.  Slightly prominent pancreatic duct.  Original Report Authenticated By: Juline Patch, M.D.   Personally viewed.  EKG:  Sinus rhythm rate 78 with no ST segment changes Personally viewed. At 3 PM yesterday she demonstrated sinus rhythm with diffuse ST segment depression approximately 1 mm  ASSESSMENT/PLAN:   71 year old female with recent abdominal discomfort, who experienced chest tightness in the setting of rapid atrial fibrillation according to Dr. Valentina Lucks with mildly elevated troponin and original EKG concerning for diffuse ST segment depression/possible 3 vessel disease. She is history of orthostatic hypotension.  Plan will be to proceed with cardiac catheterization. I have discussed with her the risks and benefits of the procedure including stroke, heart attack, death, renal impairment, loss of limb.  She is willing to  proceed.  Donato Schultz, MD  02/01/2012  11:48 AM

## 2012-02-01 NOTE — Progress Notes (Signed)
Subjective: No complaints this am.  Was having epigastric "pressure" this week.  Objective: Vital signs in last 24 hours: Temp:  [97.7 F (36.5 C)-98.3 F (36.8 C)] 97.7 F (36.5 C) (07/19 0423) Pulse Rate:  [65-119] 76  (07/19 0423) Resp:  [15-22] 18  (07/19 0423) BP: (100-127)/(56-81) 120/73 mmHg (07/19 0423) SpO2:  [93 %-100 %] 93 % (07/19 0423) Weight:  [77.4 kg (170 lb 10.2 oz)] 77.4 kg (170 lb 10.2 oz) (07/18 2124) Weight change:  Last BM Date: 01/31/12  Intake/Output from previous day: 07/18 0701 - 07/19 0700 In: -  Out: 1000 [Urine:1000] Intake/Output this shift:    General appearance: alert and cooperative Resp: clear to auscultation bilaterally Cardio: regular rate and rhythm, S1, S2 normal, no murmur, click, rub or gallop GI: soft, non-tender; bowel sounds normal; no masses,  no organomegaly Extremities: extremities normal, atraumatic, no cyanosis or edema  Lab Results:  Basename 02/01/12 0505 01/31/12 1614  WBC 6.9 8.3  HGB 13.1 13.9  HCT 39.6 41.5  PLT 290 297   BMET  Basename 02/01/12 0505 01/31/12 1614  NA 143 138  K 3.9 3.1*  CL 111 103  CO2 22 21  GLUCOSE 92 70  BUN 15 16  CREATININE 0.86 0.85  CALCIUM 9.0 9.1    Studies/Results: Dg Chest 2 View  01/31/2012  *RADIOLOGY REPORT*  Clinical Data: Mild chest pain.  Shortness of breath. Palpitations.  Hypertension.  CHEST - 2 VIEW  Comparison: Two-view chest 11/12/2080 at [facility].  Findings: The heart size is normal.  Emphysematous changes are evident.  No focal airspace disease is present.  Elevation of the right hemidiaphragm is new.  IMPRESSION:  1.  Emphysema. 2.  Interval increase in elevation of the right hemidiaphragm.  Original Report Authenticated By: Jamesetta Orleans. MATTERN, M.D.   Ct Abdomen Pelvis W Contrast  01/31/2012  *RADIOLOGY REPORT*  Clinical Data: Epigastric and right abdominal pain for 2 weeks  CT ABDOMEN AND PELVIS WITH CONTRAST  Technique:  Multidetector CT imaging  of the abdomen and pelvis was performed following the standard protocol during bolus administration of intravenous contrast.  Contrast: 30mL OMNIPAQUE IOHEXOL 300 MG/ML  SOLN, OMNIPAQUE IOHEXOL 300 MG/ML  SOLN  Comparison: None.  Findings: The lung bases are clear. There is dense breast tissue bilaterally.  The liver enhances with no focal abnormality and no ductal dilatation is seen.  No calcified gallstones are seen.  The common bile duct is slightly prominent measuring 7.2 mm, and better seen on coronal images there is suggestion of soft tissue defect within the distal common bile duct.  This could represent noncalcified gallstones or soft tissue mass and correlation with LFTs is recommended.  The pancreas is normal in size and the pancreatic duct is slightly prominent measuring 3.2 mm in diameter. The adrenal glands and spleen are unremarkable.  The stomach is not well distended.  A probable complex exophytic cyst emanates from the upper posterolateral left kidney measuring 2.0 cm in diameter. No other renal lesion is seen and on delayed images, the pelvocaliceal systems are unremarkable with extrarenal pelves bilaterally.  The abdominal aorta is normal in caliber with atheromatous change present.  No adenopathy is seen.  The urinary bladder is not well distended but is unremarkable.  The uterus appears to have been resected.  No adnexal lesion is seen. No fluid is noted within the pelvis.  There are a few scattered rectosigmoid colonic diverticula present.  No other abnormality of the colon is  seen.  IMPRESSION:  1.  Slightly prominent common bile duct with questionable filling defects within the distal common bile duct.  Cannot exclude distal common bile duct noncalcified calculi or mass.  Correlate with liver function tests. 2.  Slightly prominent pancreatic duct.  Original Report Authenticated By: Juline Patch, M.D.    Medications: I have reviewed the patient's current  medications.  Assessment/Plan: Principal Problem:  *Atrial fibrillation with RVR converted to NSR. Cardiac enzymes positive.  Will ask cardiology to see, on heparin IV and ASA Active Problems:  Dilated cbd, acquired Ct showed "slightly prominent" CBD with "questionable" filling defects.  Liver enzymes are normal.  Will check RUQ U/S, I am skeptical she has a biliary problem.  H pylori AB was positive  Major Depression, continue meds  Orthostatic Hypotension, long history on florinef   LOS: 1 day   Melinda [name] 02/01/2012, 8:04 AM

## 2012-02-01 NOTE — CV Procedure (Signed)
PROCEDURE:  Left heart catheterization with selective coronary angiography, left ventriculogram via the radial artery approach.  INDICATIONS:   71 year old with a brief episode of rapid atrial fibrillation, chest discomfort with extensive abdominal discomfort who had a mildly positive troponin as well as MB of 0.9 now trending downward here for heart catheterization. Her EKG upon arrival to the emergency department showed diffuse ST segment changes of approximately 1 mm of ST depression. My concern was for triple-vessel coronary artery disease in the light of her normal nuclear stress test.  The risks, benefits, and details of the procedure were explained to the patient, including possibilities of stroke, heart attack, death, renal impairment, arterial damage, bleeding.  The patient verbalized understanding and wanted to proceed.  Informed written consent was obtained.  PROCEDURE TECHNIQUE:  Allen's test was performed pre-and post procedure and was normal. The right radial artery site was prepped and draped in a sterile fashion. One percent lidocaine was used for local anesthesia. Using the modified Seldinger technique a 5 French hydrophilic sheath was inserted into the radial artery without difficulty. 3 mg of verapamil was administered via the sheath. A Judkins right #4 catheter with the guidance of a Versicore wire was placed in the right coronary cusp and selectively cannulated the right coronary artery. After traversing the aortic arch, 4000 units of heparin IV was administered. A Judkins left #3.5 catheter was used to selectively cannulate the left main artery. Multiple views with hand injection of Omnipaque were obtained. Catheter a pigtail catheter was used to cross into the left ventricle, hemodynamics were obtained, and a left ventriculogram was performed in the RAO position with power injection. Following the procedure, sheath was removed, patient was hemodynamically stable, hemostasis was  maintained with a Terumo T band.   CONTRAST:  Total of 65 ml.    FLOUROSCOPY TIME: 6 min.  COMPLICATIONS:  None.    HEMODYNAMICS:  Aortic pressure was  139/78/105 mmHg; LV systolic pressure was  139/14mmHg; LVEDP  17 mmHg.  There was no gradient between the left ventricle and aorta.    ANGIOGRAPHIC DATA:    Left main:  branches into LAD and circumflex, no angiographically significant disease   Left anterior descending (LAD):  there are 2 significant diagonal branches, mild luminal irregularities of LAD. No evidence of significant coronary artery disease.   Circumflex artery (CIRC): 2 obtuse marginal branches noted, minor luminal irregularities.   Right coronary artery (RCA):  dominant vessel giving rise to posterior descending artery, no flow limiting disease. Minor luminal irregularities.   LEFT VENTRICULOGRAM:  Left ventricular angiogram was done in the 30 RAO projection and revealed normal left ventricular wall motion and systolic function with an estimated ejection fraction of  65 %.   IMPRESSIONS:  No angiographically significant CAD Normal left ventricular systolic function.  LVEDP 17  mmHg.  Ejection fraction  65 %.  RECOMMENDATION: Reassuring cardiac catheterization, her mildly elevated troponin was likely demand ischemia in the setting of rapid atrial fibrillation/brief episode. There is no evidence of significant coronary disease present. Since she has significant orthostatic hypotension on both Midrin and Florinef, starting a beta blocker or calcium channel blocker may not be advantageous to her. I have discussed case with Dr. Valentina Lucks. From my point of view, she is okay for discharge when stable from her wrist/catheterization site. Likely within the next 3-4 hours.

## 2012-02-01 NOTE — Care Management Note (Unsigned)
    Page 1 of 1   02/01/2012     1:51:03 PM   CARE MANAGEMENT NOTE 02/01/2012  Patient:  Melinda [name], Melinda [name]   Account Number:  [id number]  Date Initiated:  02/01/2012  Documentation initiated by:  SIMMONS,Lashan Macias  Subjective/Objective Assessment:   ADMITTED WITH AFIB; LIVES AT HOME ALONE; WAS IPTA.     Action/Plan:   DISCHARGE PLANNING INITIATED.   Anticipated DC Date:  02/02/2012   Anticipated DC Plan:  HOME/SELF CARE      DC Planning Services  CM consult      Choice offered to / List presented to:             Status of service:  In process, will continue to follow Medicare Important Message given?   (If response is "NO", the following Medicare IM given date fields will be blank) Date Medicare IM given:   Date Additional Medicare IM given:    Discharge Disposition:    Per UR Regulation:  Reviewed for med. necessity/level of care/duration of stay  If discussed at Long Length of Stay Meetings, dates discussed:    Comments:  02/01/12  1349  Labresha Mellor SIMMONS RN, BSN [phone] AWAITING PT EVAL TO ASSIST WITH RECOMMENDATIONS FOR D/C PLANNING;  NCM WILL FOLLOW.

## 2012-02-02 NOTE — Discharge Summary (Signed)
Physician Discharge Summary  Patient ID: Melinda [name] MRN: [id number] DOB/AGE: 70-Jan-1942 70 y.o.  Admit date: 01/31/2012 Discharge date: 02/02/2012  Admission Diagnoses:  Discharge Diagnoses:  Principal Problem:  *Atrial fibrillation with RVR- resolved epigastric pressure- abnormal Troponin- Cardiac Cath normal coronary. H.Pylori ABX positve GERD Active Problems:  Dilated cbd, acquired- u/s abd negative  Hypothyroidism  Anxiety [place]  Depression Orthostatic BP  Elevated troponin UTI- tx with cipro x3 day   Discharged Condition: good  Hospital Course: 71 female admit with abdominal and epigartric pain.  #1: lab showed mild elevated troponin and was found to be n Afib /RVR- Converted to NSR- spontaneously- cardiology consult- under went cath- which showed normal coronaries, normal EF. All other  Labs  and xray was ok U/S abdomen negative- LFT normal GERD- pt with intermittent Nausea- started on Dexilant out pt. H. Pylori ABX test positive could be causing symptoms- will need to be treated- out pt follow up with PCP Depression/ Anxiety: continue home meds- stable Orthostatic BP - continue on Florinef and midodrine UTI- tx for cipro x 3 days F/u  In office 2-3 days    Consults: cardiology  Significant Diagnostic Studies: labs: all labs are ok, radiology: CXR: normal and angiography: Cath cardiac negative  Treatments: IV hydration  Discharge Exam: Blood pressure 129/71, pulse 61, temperature 97.6 F (36.4 C), temperature source Axillary, resp. rate 18, height 5' 10.8" (1.798 m), weight 78.4 kg (172 lb 13.5 oz), SpO2 96.00%. General appearance: alert Resp: clear to auscultation bilaterally Cardio: regular rate and rhythm GI: soft, non-tender; bowel sounds normal; no masses,  no organomegaly  Disposition:  Home.  Discharge Orders    Future Orders Please Complete By Expires   Diet - low sodium heart healthy      Increase activity slowly         Medication List  As of 02/02/2012 10:47 AM   STOP taking these medications         ciprofloxacin 250 MG tablet         TAKE these medications         aspirin 81 MG chewable tablet   Chew 81 mg by mouth daily.      buPROPion 150 MG 24 hr tablet   Commonly known as: WELLBUTRIN XL   Take 450 mg by mouth daily.      clonazePAM 0.5 MG tablet   Commonly known as: KLONOPIN   Take 0.5 mg by mouth at bedtime as needed. For sleep      fludrocortisone 0.1 MG tablet   Commonly known as: FLORINEF   Take 0.3 mg by mouth daily.      midodrine 10 MG tablet   Commonly known as: PROAMATINE   Take 10 mg by mouth 3 (three) times daily.      PARoxetine 40 MG tablet   Commonly known as: PAXIL   Take 40 mg by mouth every morning.      promethazine 25 MG tablet   Commonly known as: PHENERGAN   Take 25 mg by mouth every 6 (six) hours as needed. For nausea      tetrabenazine 12.5 MG tablet   Commonly known as: XENAZINE   Take 12.5 mg by mouth 2 (two) times daily.           Follow-up Information    Follow up with Lillia Mountain, MD.   Contact information:   [address] [employer], [place]. [place] [address] [phone]  Total time on discharge was 35 mins.  SignedGeorgann Housekeeper 02/02/2012, 10:47 AM

## 2012-02-02 NOTE — Plan of Care (Deleted)
Problem: Phase II Progression Outcomes Goal: Vascular site scale level 0 - I Vascular Site Scale Level 0: No bruising/bleeding/hematoma Level I (Mild): Bruising/Ecchymosis, minimal bleeding/ooozing, palpable hematoma < 3 cm Level II (Moderate): Bleeding not affecting hemodynamic parameters, pseudoaneurysm, palpable hematoma > 3 cm  Outcome: Completed/Met Date Met:  02/02/12 Pt instructed to not pull with her rt wrist/thumb and to keep wrist straight as poss and that pulse ox to the thumb will stay on til am.

## 2012-02-02 NOTE — Discharge Instructions (Signed)
Radial Site Care Refer to this sheet in the next few weeks. These instructions provide you with information on caring for yourself after your procedure. Your caregiver may also give you more specific instructions. Your treatment has been planned according to current medical practices, but problems sometimes occur. Call your caregiver if you have any problems or questions after your procedure. HOME CARE INSTRUCTIONS  You may shower the day after the procedure.Remove the bandage (dressing) and gently wash the site with plain soap and water.Gently pat the site dry.   Do not apply powder or lotion to the site.   Do not submerge the affected site in water for 3 to 5 days.   Inspect the site at least twice daily.   Do not flex or bend the affected arm for 24 hours.   No lifting over 5 pounds (2.3 kg) for 5 days after your procedure.   Do not drive home if you are discharged the same day of the procedure. Have someone else drive you.   You may drive 24 hours after the procedure unless otherwise instructed by your caregiver.   Do not operate machinery or power tools for 24 hours.   A responsible adult should be with you for the first 24 hours after you arrive home.  What to expect:  Any bruising will usually fade within 1 to 2 weeks.   Blood that collects in the tissue (hematoma) may be painful to the touch. It should usually decrease in size and tenderness within 1 to 2 weeks.  SEEK IMMEDIATE MEDICAL CARE IF:  You have unusual pain at the radial site.   You have redness, warmth, swelling, or pain at the radial site.   You have drainage (other than a small amount of blood on the dressing).   You have chills.   You have a fever or persistent symptoms for more than 72 hours.   You have a fever and your symptoms suddenly get worse.   Your arm becomes pale, cool, tingly, or numb.   You have heavy bleeding from the site. Hold pressure on the site.  Document Released: 08/04/2010  Document Revised: 06/21/2011 Document Reviewed: 08/04/2010 [employer] Patient Information 2012 [employer]. 

## 2012-02-04 NOTE — ED Provider Notes (Signed)
I saw and evaluated the patient, reviewed the resident's note and I agree with the findings and plan.   .Face to face Exam:  General:  Awake HEENT:  Atraumatic Resp:  Normal effort Abd:  Nondistended Neuro:No focal weakness Lymph: No adenopathy   Nelia Shi, MD 02/04/12 [phone]

## 2012-05-21 ENCOUNTER — Other Ambulatory Visit: Payer: Self-pay | Admitting: Internal Medicine

## 2012-05-21 DIAGNOSIS — IMO0002 Reserved for concepts with insufficient information to code with codable children: Secondary | ICD-10-CM

## 2012-05-22 ENCOUNTER — Ambulatory Visit
Admission: RE | Admit: 2012-05-22 | Discharge: 2012-05-22 | Disposition: A | Discharge status: Home / Self Care | Payer: Medicare Other | Source: Ambulatory Visit | Attending: Internal Medicine | Admitting: Internal Medicine

## 2012-05-22 DIAGNOSIS — IMO0002 Reserved for concepts with insufficient information to code with codable children: Secondary | ICD-10-CM

## 2012-05-22 MED ORDER — IOHEXOL 300 MG/ML  SOLN
100.0000 mL | Freq: Once | INTRAMUSCULAR | Status: AC | PRN
  Administered 2012-05-22: 100 mL via INTRAVENOUS

## 2012-05-23 ENCOUNTER — Encounter (INDEPENDENT_AMBULATORY_CARE_PROVIDER_SITE_OTHER): Payer: Self-pay | Admitting: Surgery

## 2012-06-03 ENCOUNTER — Ambulatory Visit (INDEPENDENT_AMBULATORY_CARE_PROVIDER_SITE_OTHER): Payer: Medicare Other | Admitting: Surgery

## 2012-06-03 ENCOUNTER — Encounter (INDEPENDENT_AMBULATORY_CARE_PROVIDER_SITE_OTHER): Payer: Self-pay | Admitting: Surgery

## 2012-06-03 VITALS — BP 116/83 | HR 84 | Temp 97.6°F | Resp 18 | Ht 67.0 in | Wt 169.6 lb

## 2012-06-03 DIAGNOSIS — K4090 Unilateral inguinal hernia, without obstruction or gangrene, not specified as recurrent: Secondary | ICD-10-CM

## 2012-06-03 NOTE — Progress Notes (Signed)
Patient ID: Melinda [name], female   DOB: 02/19/1941, 71 y.o.   MRN: [id number]  Chief Complaint  Patient presents with  . Hernia    HPI Melinda [name] is a 71 y.o. female.   HPI This is a pleasant female referred by Dr. Hussain for a symptomatic right inguinal hernia. She recently noticed a bulge in her groin. She has mild discomfort which is not refer anywhere else. She has no obstructive symptoms. She is otherwise without complaints. Past Medical History  Diagnosis Date  . Depression   . Anxiety   . Thyroid disease   . Hearing loss     Past Surgical History  Procedure Date  . Back surgery   . Abdominal hysterectomy   . Breast enhancement surgery   . Hammer toe surgery     History reviewed. No pertinent family history.  Social History History  Substance Use Topics  . Smoking status: Former Smoker  . Smokeless tobacco: Not on file     Comment: quit 15 years ago  . Alcohol Use: No    Allergies  Allergen Reactions  . 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 aspirin 81 MG chewable tablet Chew 81 mg by mouth daily.      . buPROPion (WELLBUTRIN XL) 150 MG 24 hr tablet Take 450 mg by mouth daily.      . clonazePAM (KLONOPIN) 0.5 MG tablet Take 0.5 mg by mouth at bedtime as needed. For sleep      . estradiol (ESTRACE) 2 MG tablet Take 2 mg by mouth daily.      . fludrocortisone (FLORINEF) 0.1 MG tablet Take 0.3 mg by mouth daily.      . levothyroxine (SYNTHROID, LEVOTHROID) 75 MCG tablet Take 75 mcg by mouth daily.      . midodrine (PROAMATINE) 10 MG tablet Take 10 mg by mouth 3 (three) times daily.      . PARoxetine (PAXIL) 40 MG tablet Take 40 mg by mouth every morning.      . promethazine (PHENERGAN) 25 MG tablet Take 25 mg by mouth every 6 (six) hours as needed. For nausea      . tetrabenazine (XENAZINE) 12.5 MG tablet Take 12.5 mg by mouth 2 (two) times daily.        Review  of Systems Review of Systems  Constitutional: Negative for fever, chills and unexpected weight change.  HENT: Negative for hearing loss, congestion, sore throat, trouble swallowing and voice change.   Eyes: Negative for visual disturbance.  Respiratory: Negative for cough and wheezing.   Cardiovascular: Negative for chest pain, palpitations and leg swelling.  Gastrointestinal: Negative for nausea, vomiting, abdominal pain, diarrhea, constipation, blood in stool, abdominal distention and anal bleeding.  Genitourinary: Negative for hematuria, vaginal bleeding and difficulty urinating.  Musculoskeletal: Negative for arthralgias.  Skin: Negative for rash and wound.  Neurological: Negative for seizures, syncope and headaches.  Hematological: Negative for adenopathy. Does not bruise/bleed easily.  Psychiatric/Behavioral: Negative for confusion.    Blood pressure 116/83, pulse 84, temperature 97.6 F (36.4 C), temperature source Temporal, resp. rate 18, height 5' 7" (1.702 m), weight 169 lb 9.6 oz (76.93 kg).  Physical Exam Physical Exam  Constitutional: She is oriented to person, place, and time. She appears well-developed and well-nourished. No distress.  HENT:  Head: Normocephalic and atraumatic.  Right Ear: External ear normal.  Left Ear: External ear normal.    Nose: Nose normal.  Mouth/Throat: Oropharynx is clear and moist. No oropharyngeal exudate.  Eyes: Conjunctivae normal are normal. Pupils are equal, round, and reactive to light. Right eye exhibits no discharge. Left eye exhibits no discharge. No scleral icterus.  Neck: Normal range of motion. Neck supple. No tracheal deviation present. No thyromegaly present.  Cardiovascular: Normal rate, regular rhythm, normal heart sounds and intact distal pulses.   No murmur heard. Pulmonary/Chest: Effort normal and breath sounds normal. No respiratory distress. She has no wheezes.  Abdominal: Soft. Bowel sounds are normal. She exhibits no  distension. There is no tenderness. There is no rebound.       There is a very small reducible right inguinal hernia  Musculoskeletal: Normal range of motion. She exhibits no edema.  Lymphadenopathy:    She has no cervical adenopathy.  Neurological: She is alert and oriented to person, place, and time.  Skin: Skin is warm and dry. No rash noted. She is not diaphoretic. No erythema.  Psychiatric: Her behavior is normal. Judgment normal.    Data Reviewed I have reviewed her CAT scan from November as well as from July which both demonstrated a right inguinal hernia containing only fat  Assessment    Right inguinal hernia    Plan    As she is symptomatic, repair with mesh was recommended. I discussed this with her in detail. I discussed the risks of surgery which includes but is not limited to bleeding, infection, nerve entrapment, chronic pain, recurrence, etc. She understands and wishes to proceed. Surgery will be scheduled. Likelihood of success is good       [name] A 06/03/2012, 9:58 AM    

## 2012-06-18 ENCOUNTER — Encounter (HOSPITAL_COMMUNITY): Payer: Self-pay | Admitting: Pharmacy Technician

## 2012-06-18 ENCOUNTER — Encounter (HOSPITAL_COMMUNITY)
Admission: RE | Admit: 2012-06-18 | Discharge: 2012-06-18 | Disposition: A | Discharge status: Home / Self Care | Payer: Medicare Other | Source: Ambulatory Visit | Attending: Surgery | Admitting: Surgery

## 2012-06-18 ENCOUNTER — Encounter (HOSPITAL_COMMUNITY): Payer: Self-pay

## 2012-06-18 HISTORY — DX: Cardiac arrhythmia, unspecified: I49.9

## 2012-06-18 HISTORY — DX: Tremor, unspecified: R25.1

## 2012-06-18 HISTORY — DX: Malignant (primary) neoplasm, unspecified: C80.1

## 2012-06-18 HISTORY — DX: Unspecified osteoarthritis, unspecified site: M19.90

## 2012-06-18 HISTORY — DX: Other specified postprocedural states: Z98.890

## 2012-06-18 HISTORY — DX: Other specified postprocedural states: R11.2

## 2012-06-18 HISTORY — DX: Hypothyroidism, unspecified: E03.9

## 2012-06-18 LAB — BASIC METABOLIC PANEL
BUN: 27 mg/dL — ABNORMAL HIGH (ref 6–23)
CO2: 25 mEq/L (ref 19–32)
Chloride: 105 mEq/L (ref 96–112)
GFR calc non Af Amer: 59 mL/min — ABNORMAL LOW (ref >90)
Glucose, Bld: 86 mg/dL (ref 70–99)
Potassium: 4.2 mEq/L (ref 3.5–5.1)
Sodium: 140 mEq/L (ref 135–145)

## 2012-06-18 LAB — CBC
HCT: 45.5 % (ref 36.0–46.0)
Hemoglobin: 15.1 g/dL — ABNORMAL HIGH (ref 12.0–15.0)
MCHC: 33.2 g/dL (ref 30.0–36.0)
RBC: 4.62 MIL/uL (ref 3.87–5.11)
WBC: 7.7 10*3/uL (ref 4.0–10.5)

## 2012-06-18 LAB — SURGICAL PCR SCREEN: Staphylococcus aureus: NEGATIVE

## 2012-06-18 NOTE — Pre-Procedure Instructions (Signed)
20 Melinda [name]  06/18/2012   Your procedure is scheduled on:  06/27/12  Report to [facility] at 930 AM.  Call this number if you have problems the morning of surgery: [phone]   Remember:   Do not eat food:After Midnight.    Take these medicines the morning of surgery with A SIP OF WATER: wellbutrin,estrace,synthroid,paxil,florinef   Do not wear jewelry, make-up or nail polish.  Do not wear lotions, powders, or perfumes. You may wear deodorant.  Do not shave 48 hours prior to surgery. Men may shave face and neck.  Do not bring valuables to the hospital.  Contacts, dentures or bridgework may not be worn into surgery.  Leave suitcase in the car. After surgery it may be brought to your room.  For patients admitted to the hospital, checkout time is 11:00 AM the day of discharge.   Patients discharged the day of surgery will not be allowed to drive home.  Name and phone number of your driver: family  Special Instructions: Shower using CHG 2 nights before surgery and the night before surgery.  If you shower the day of surgery use CHG.  Use special wash - you have one bottle of CHG for all showers.  You should use approximately 1/3 of the bottle for each shower.   Please read over the following fact sheets that you were given: Pain Booklet, Coughing and Deep Breathing, MRSA Information and Surgical Site Infection Prevention

## 2012-06-18 NOTE — Progress Notes (Signed)
Will give chart to [facility] for cardiac review

## 2012-06-19 ENCOUNTER — Encounter (HOSPITAL_COMMUNITY): Payer: Self-pay | Admitting: Vascular Surgery

## 2012-06-19 NOTE — Consult Note (Signed)
Anesthesia chart review: Patient is a 71 year old female scheduled for right inguinal hernia repair with mesh by Dr. Magnus Ivan on 06/27/2012. History includes former smoker, postoperative nausea and vomiting, depression, anxiety, orthostatic hypotension on midodrine and Florinef, hypothyroidism, tremors, hearing loss, hearing cancer status post hysterectomy, breast enhancement surgery, prior back surgery.  She was hospitalized in mid July 2013 for brief afib with RVR (that converted to NSR spontaneously) and mildly elevated troponin and ultimately had a cardiac cath showing no significant CAD.  PCP is Dr. Kirby Funk.  EKG on 01/31/12 showed SR, possible anteroseptal infarct.  Cardiac cath on 01/31/2012 showed no angiographically significant CAD, normal left ventricular systolic function. LVEDP 70 mmHg. EF 65%. Since she converted to normal sinus rhythm and has a history of significant orthostatic hypotension, Dr. Anne Fu felt that starting a beta blocker or calcium channel blocker may not be advantageous for her.  He discussed this with Dr. Valentina Lucks.  Nuclear stress test on 08/16/2011 showed normal myocardial perfusion, post-stress EF is 91%. Low risk scan.  Her last echo was on 01/03/01 and showed EF 60%, mild aortic sclerosis, mild TR.  Chest x-ray on 01/31/2012 showed emphysema, elevation of the right hemidiaphragm.  Preoperative CBC and BMET noted.  She has not had any known recurrent afib.  She is not on beta blocker or calcium channel blocker therapy due to significant orthostatic hypotension.  She had no significant CAD and normal EF by cath in July 2013.  If no significant change in her status then anticipate she can proceed as planned.  Shonna Chock, PA-C 06/19/12 1544

## 2012-06-26 MED ORDER — CEFAZOLIN SODIUM-DEXTROSE 2-3 GM-% IV SOLR
2.0000 g | INTRAVENOUS | Status: AC
  Administered 2012-06-27: 2 g via INTRAVENOUS
  Filled 2012-06-26: qty 50

## 2012-06-26 NOTE — Progress Notes (Signed)
Message left for pt. To arrive @ 7:30 AM for 10:30 AM surgery.

## 2012-06-27 ENCOUNTER — Encounter (HOSPITAL_COMMUNITY): Payer: Self-pay | Admitting: *Deleted

## 2012-06-27 ENCOUNTER — Encounter (HOSPITAL_COMMUNITY): Payer: Self-pay | Admitting: Vascular Surgery

## 2012-06-27 ENCOUNTER — Ambulatory Visit (HOSPITAL_COMMUNITY): Payer: Medicare Other | Admitting: Vascular Surgery

## 2012-06-27 ENCOUNTER — Encounter (HOSPITAL_COMMUNITY)
Admission: RE | Disposition: A | Discharge status: Home / Self Care | Payer: Self-pay | Source: Ambulatory Visit | Attending: Surgery

## 2012-06-27 ENCOUNTER — Ambulatory Visit (HOSPITAL_COMMUNITY)
Admission: RE | Admit: 2012-06-27 | Discharge: 2012-06-27 | Disposition: A | Discharge status: Home / Self Care | Payer: Medicare Other | Source: Ambulatory Visit | Attending: Surgery | Admitting: Surgery

## 2012-06-27 DIAGNOSIS — K4090 Unilateral inguinal hernia, without obstruction or gangrene, not specified as recurrent: Secondary | ICD-10-CM

## 2012-06-27 DIAGNOSIS — I499 Cardiac arrhythmia, unspecified: Secondary | ICD-10-CM | POA: Insufficient documentation

## 2012-06-27 DIAGNOSIS — F411 Generalized anxiety disorder: Secondary | ICD-10-CM | POA: Insufficient documentation

## 2012-06-27 DIAGNOSIS — Z90710 Acquired absence of both cervix and uterus: Secondary | ICD-10-CM | POA: Insufficient documentation

## 2012-06-27 DIAGNOSIS — F3289 Other specified depressive episodes: Secondary | ICD-10-CM | POA: Insufficient documentation

## 2012-06-27 DIAGNOSIS — Z7982 Long term (current) use of aspirin: Secondary | ICD-10-CM | POA: Insufficient documentation

## 2012-06-27 DIAGNOSIS — H9190 Unspecified hearing loss, unspecified ear: Secondary | ICD-10-CM | POA: Insufficient documentation

## 2012-06-27 DIAGNOSIS — E039 Hypothyroidism, unspecified: Secondary | ICD-10-CM | POA: Insufficient documentation

## 2012-06-27 DIAGNOSIS — F329 Major depressive disorder, single episode, unspecified: Secondary | ICD-10-CM | POA: Insufficient documentation

## 2012-06-27 DIAGNOSIS — Z87891 Personal history of nicotine dependence: Secondary | ICD-10-CM | POA: Insufficient documentation

## 2012-06-27 DIAGNOSIS — Z01812 Encounter for preprocedural laboratory examination: Secondary | ICD-10-CM | POA: Insufficient documentation

## 2012-06-27 HISTORY — PX: INSERTION OF MESH: SHX5868

## 2012-06-27 HISTORY — PX: INGUINAL HERNIA REPAIR: SHX194

## 2012-06-27 SURGERY — REPAIR, HERNIA, INGUINAL, ADULT
Anesthesia: General | Site: Groin | Laterality: Right | Wound class: Clean

## 2012-06-27 MED ORDER — OXYCODONE HCL 5 MG PO TABS
5.0000 mg | ORAL_TABLET | Freq: Once | ORAL | Status: DC | PRN
  Filled 2012-06-27: qty 1

## 2012-06-27 MED ORDER — ONDANSETRON HCL 4 MG/2ML IJ SOLN: 4.0000 mg | Freq: Four times a day (QID) | INTRAMUSCULAR | Status: DC | PRN

## 2012-06-27 MED ORDER — LIDOCAINE HCL (CARDIAC) 20 MG/ML IV SOLN
INTRAVENOUS | Status: DC | PRN
  Administered 2012-06-27: 50 mg via INTRAVENOUS

## 2012-06-27 MED ORDER — HYDROCODONE-ACETAMINOPHEN 5-325 MG PO TABS: 1.0000 | ORAL_TABLET | Freq: Four times a day (QID) | ORAL | Status: DC | PRN

## 2012-06-27 MED ORDER — ACETAMINOPHEN 325 MG PO TABS: 650.0000 mg | ORAL_TABLET | ORAL | Status: DC | PRN

## 2012-06-27 MED ORDER — BUPIVACAINE-EPINEPHRINE 0.5% -1:200000 IJ SOLN
INTRAMUSCULAR | Status: DC | PRN
  Administered 2012-06-27: 20 mL

## 2012-06-27 MED ORDER — HYDROMORPHONE HCL PF 1 MG/ML IJ SOLN
INTRAMUSCULAR | Status: AC
  Administered 2012-06-27: 0.25 mg via INTRAVENOUS
  Filled 2012-06-27: qty 1

## 2012-06-27 MED ORDER — ONDANSETRON HCL 4 MG/2ML IJ SOLN
INTRAMUSCULAR | Status: DC | PRN
  Administered 2012-06-27: 4 mg via INTRAVENOUS

## 2012-06-27 MED ORDER — SODIUM CHLORIDE 0.9 % IJ SOLN: 3.0000 mL | Freq: Two times a day (BID) | INTRAMUSCULAR | Status: DC

## 2012-06-27 MED ORDER — SODIUM CHLORIDE 0.9 % IV SOLN: 250.0000 mL | INTRAVENOUS | Status: DC | PRN

## 2012-06-27 MED ORDER — PROMETHAZINE HCL 25 MG/ML IJ SOLN
6.2500 mg | INTRAMUSCULAR | Status: DC | PRN
  Administered 2012-06-27: 6.25 mg via INTRAVENOUS

## 2012-06-27 MED ORDER — BUPIVACAINE-EPINEPHRINE (PF) 0.5% -1:200000 IJ SOLN
INTRAMUSCULAR | Status: AC
  Filled 2012-06-27: qty 10

## 2012-06-27 MED ORDER — DEXAMETHASONE SODIUM PHOSPHATE 4 MG/ML IJ SOLN
INTRAMUSCULAR | Status: DC | PRN
  Administered 2012-06-27: 4 mg via INTRAVENOUS

## 2012-06-27 MED ORDER — SODIUM CHLORIDE 0.9 % IJ SOLN: 3.0000 mL | INTRAMUSCULAR | Status: DC | PRN

## 2012-06-27 MED ORDER — LACTATED RINGERS IV SOLN
INTRAVENOUS | Status: DC
  Administered 2012-06-27: 10:00:00 via INTRAVENOUS

## 2012-06-27 MED ORDER — MIDAZOLAM HCL 5 MG/5ML IJ SOLN
INTRAMUSCULAR | Status: DC | PRN
  Administered 2012-06-27: 2 mg via INTRAVENOUS

## 2012-06-27 MED ORDER — MEPERIDINE HCL 25 MG/ML IJ SOLN: 6.2500 mg | INTRAMUSCULAR | Status: DC | PRN

## 2012-06-27 MED ORDER — OXYCODONE HCL 5 MG PO TABS
5.0000 mg | ORAL_TABLET | ORAL | Status: DC | PRN
  Administered 2012-06-27: 5 mg via ORAL

## 2012-06-27 MED ORDER — PROPOFOL 10 MG/ML IV BOLUS
INTRAVENOUS | Status: DC | PRN
  Administered 2012-06-27: 200 mg via INTRAVENOUS

## 2012-06-27 MED ORDER — PROMETHAZINE HCL 25 MG/ML IJ SOLN
INTRAMUSCULAR | Status: AC
  Filled 2012-06-27: qty 1

## 2012-06-27 MED ORDER — FENTANYL CITRATE 0.05 MG/ML IJ SOLN
INTRAMUSCULAR | Status: DC | PRN
  Administered 2012-06-27: 100 ug via INTRAVENOUS

## 2012-06-27 MED ORDER — OXYCODONE HCL 5 MG/5ML PO SOLN: 5.0000 mg | Freq: Once | ORAL | Status: DC | PRN

## 2012-06-27 MED ORDER — MORPHINE SULFATE 2 MG/ML IJ SOLN: 1.0000 mg | INTRAMUSCULAR | Status: DC | PRN

## 2012-06-27 MED ORDER — ACETAMINOPHEN 650 MG RE SUPP: 650.0000 mg | RECTAL | Status: DC | PRN

## 2012-06-27 MED ORDER — HYDROMORPHONE HCL PF 1 MG/ML IJ SOLN
0.2500 mg | INTRAMUSCULAR | Status: DC | PRN
  Administered 2012-06-27 (×2): 0.25 mg via INTRAVENOUS

## 2012-06-27 MED ORDER — 0.9 % SODIUM CHLORIDE (POUR BTL) OPTIME
TOPICAL | Status: DC | PRN
  Administered 2012-06-27: 1000 mL

## 2012-06-27 SURGICAL SUPPLY — 44 items
ADH SKN CLS LQ APL DERMABOND (GAUZE/BANDAGES/DRESSINGS) ×1
APL SKNCLS STERI-STRIP NONHPOA (GAUZE/BANDAGES/DRESSINGS) ×1
BENZOIN TINCTURE PRP APPL 2/3 (GAUZE/BANDAGES/DRESSINGS) ×2 IMPLANT
BLADE SURG 10 STRL SS (BLADE) ×2 IMPLANT
BLADE SURG 15 STRL LF DISP TIS (BLADE) ×1 IMPLANT
BLADE SURG 15 STRL SS (BLADE) ×2
BLADE SURG ROTATE 9660 (MISCELLANEOUS) IMPLANT
CHLORAPREP W/TINT 26ML (MISCELLANEOUS) ×2 IMPLANT
CLOTH BEACON ORANGE TIMEOUT ST (SAFETY) ×2 IMPLANT
COVER SURGICAL LIGHT HANDLE (MISCELLANEOUS) ×2 IMPLANT
DERMABOND ADHESIVE PROPEN (GAUZE/BANDAGES/DRESSINGS) ×1
DERMABOND ADVANCED .7 DNX6 (GAUZE/BANDAGES/DRESSINGS) IMPLANT
DRAIN PENROSE 1/2X12 LTX STRL (WOUND CARE) IMPLANT
DRAPE LAPAROTOMY TRNSV 102X78 (DRAPE) ×2 IMPLANT
DRESSING TELFA 8X3 (GAUZE/BANDAGES/DRESSINGS) ×2 IMPLANT
DRSG TEGADERM 4X4.75 (GAUZE/BANDAGES/DRESSINGS) ×2 IMPLANT
ELECT CAUTERY BLADE 6.4 (BLADE) ×2 IMPLANT
ELECT REM PT RETURN 9FT ADLT (ELECTROSURGICAL) ×2
ELECTRODE REM PT RTRN 9FT ADLT (ELECTROSURGICAL) ×1 IMPLANT
GLOVE BIOGEL PI IND STRL 6.5 (GLOVE) IMPLANT
GLOVE BIOGEL PI INDICATOR 6.5 (GLOVE) ×2
GLOVE SURG SIGNA 7.5 PF LTX (GLOVE) ×2 IMPLANT
GOWN PREVENTION PLUS XLARGE (GOWN DISPOSABLE) ×2 IMPLANT
GOWN STRL NON-REIN LRG LVL3 (GOWN DISPOSABLE) ×2 IMPLANT
KIT BASIN OR (CUSTOM PROCEDURE TRAY) ×2 IMPLANT
KIT ROOM TURNOVER OR (KITS) ×2 IMPLANT
MESH PARIETEX PROGRIP RIGHT (Mesh General) ×1 IMPLANT
NDL HYPO 25GX1X1/2 BEV (NEEDLE) ×1 IMPLANT
NEEDLE HYPO 25GX1X1/2 BEV (NEEDLE) ×2 IMPLANT
NS IRRIG 1000ML POUR BTL (IV SOLUTION) ×2 IMPLANT
PACK SURGICAL SETUP 50X90 (CUSTOM PROCEDURE TRAY) ×2 IMPLANT
PAD ARMBOARD 7.5X6 YLW CONV (MISCELLANEOUS) ×4 IMPLANT
PENCIL BUTTON HOLSTER BLD 10FT (ELECTRODE) ×2 IMPLANT
SPONGE LAP 18X18 X RAY DECT (DISPOSABLE) ×2 IMPLANT
STRIP CLOSURE SKIN 1/2X4 (GAUZE/BANDAGES/DRESSINGS) ×2 IMPLANT
SUT MON AB 4-0 PC3 18 (SUTURE) ×2 IMPLANT
SUT SILK 2 0 SH (SUTURE) IMPLANT
SUT VIC AB 2-0 CT1 27 (SUTURE) ×4
SUT VIC AB 2-0 CT1 TAPERPNT 27 (SUTURE) ×2 IMPLANT
SUT VIC AB 3-0 CT1 27 (SUTURE) ×2
SUT VIC AB 3-0 CT1 TAPERPNT 27 (SUTURE) ×1 IMPLANT
SYR CONTROL 10ML LL (SYRINGE) ×2 IMPLANT
TOWEL OR 17X24 6PK STRL BLUE (TOWEL DISPOSABLE) ×2 IMPLANT
TOWEL OR 17X26 10 PK STRL BLUE (TOWEL DISPOSABLE) ×2 IMPLANT

## 2012-06-27 NOTE — H&P (View-Only) (Signed)
Patient ID: Melinda [name], female   DOB: 12/11/1940, 71 y.o.   MRN: [id number]  Chief Complaint  Patient presents with  . Hernia    HPI Melinda [name] is a 71 y.o. female.   HPI This is a pleasant female referred by Dr. Eula Listen for a symptomatic right inguinal hernia. She recently noticed a bulge in her groin. She has mild discomfort which is not refer anywhere else. She has no obstructive symptoms. She is otherwise without complaints. Past Medical History  Diagnosis Date  . Depression   . Anxiety   . Thyroid disease   . Hearing loss     Past Surgical History  Procedure Date  . Back surgery   . Abdominal hysterectomy   . Breast enhancement surgery   . Hammer toe surgery     History reviewed. No pertinent family history.  Social History History  Substance Use Topics  . Smoking status: Former Games developer  . Smokeless tobacco: Not on file     Comment: quit 15 years ago  . Alcohol Use: No    Allergies  Allergen Reactions  . 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 aspirin 81 MG chewable tablet Chew 81 mg by mouth daily.      Marland Kitchen buPROPion (WELLBUTRIN XL) 150 MG 24 hr tablet Take 450 mg by mouth daily.      . clonazePAM (KLONOPIN) 0.5 MG tablet Take 0.5 mg by mouth at bedtime as needed. For sleep      . estradiol (ESTRACE) 2 MG tablet Take 2 mg by mouth daily.      . fludrocortisone (FLORINEF) 0.1 MG tablet Take 0.3 mg by mouth daily.      Marland Kitchen levothyroxine (SYNTHROID, LEVOTHROID) 75 MCG tablet Take 75 mcg by mouth daily.      . midodrine (PROAMATINE) 10 MG tablet Take 10 mg by mouth 3 (three) times daily.      Marland Kitchen PARoxetine (PAXIL) 40 MG tablet Take 40 mg by mouth every morning.      . promethazine (PHENERGAN) 25 MG tablet Take 25 mg by mouth every 6 (six) hours as needed. For nausea      . tetrabenazine (XENAZINE) 12.5 MG tablet Take 12.5 mg by mouth 2 (two) times daily.        Review  of Systems Review of Systems  Constitutional: Negative for fever, chills and unexpected weight change.  HENT: Negative for hearing loss, congestion, sore throat, trouble swallowing and voice change.   Eyes: Negative for visual disturbance.  Respiratory: Negative for cough and wheezing.   Cardiovascular: Negative for chest pain, palpitations and leg swelling.  Gastrointestinal: Negative for nausea, vomiting, abdominal pain, diarrhea, constipation, blood in stool, abdominal distention and anal bleeding.  Genitourinary: Negative for hematuria, vaginal bleeding and difficulty urinating.  Musculoskeletal: Negative for arthralgias.  Skin: Negative for rash and wound.  Neurological: Negative for seizures, syncope and headaches.  Hematological: Negative for adenopathy. Does not bruise/bleed easily.  Psychiatric/Behavioral: Negative for confusion.    Blood pressure 116/83, pulse 84, temperature 97.6 F (36.4 C), temperature source Temporal, resp. rate 18, height 5\' 7"  (1.702 m), weight 169 lb 9.6 oz (76.93 kg).  Physical Exam Physical Exam  Constitutional: She is oriented to person, place, and time. She appears well-developed and well-nourished. No distress.  HENT:  Head: Normocephalic and atraumatic.  Right Ear: External ear normal.  Left Ear: External ear normal.  Nose: Nose normal.  Mouth/Throat: Oropharynx is clear and moist. No oropharyngeal exudate.  Eyes: Conjunctivae normal are normal. Pupils are equal, round, and reactive to light. Right eye exhibits no discharge. Left eye exhibits no discharge. No scleral icterus.  Neck: Normal range of motion. Neck supple. No tracheal deviation present. No thyromegaly present.  Cardiovascular: Normal rate, regular rhythm, normal heart sounds and intact distal pulses.   No murmur heard. Pulmonary/Chest: Effort normal and breath sounds normal. No respiratory distress. She has no wheezes.  Abdominal: Soft. Bowel sounds are normal. She exhibits no  distension. There is no tenderness. There is no rebound.       There is a very small reducible right inguinal hernia  Musculoskeletal: Normal range of motion. She exhibits no edema.  Lymphadenopathy:    She has no cervical adenopathy.  Neurological: She is alert and oriented to person, place, and time.  Skin: Skin is warm and dry. No rash noted. She is not diaphoretic. No erythema.  Psychiatric: Her behavior is normal. Judgment normal.    Data Reviewed I have reviewed her CAT scan from November as well as from July which both demonstrated a right inguinal hernia containing only fat  Assessment    Right inguinal hernia    Plan    As she is symptomatic, repair with mesh was recommended. I discussed this with her in detail. I discussed the risks of surgery which includes but is not limited to bleeding, infection, nerve entrapment, chronic pain, recurrence, etc. She understands and wishes to proceed. Surgery will be scheduled. Likelihood of success is good       Daric Koren A 06/03/2012, 9:58 AM

## 2012-06-27 NOTE — Interval H&P Note (Signed)
History and Physical Interval Note: no change in H and P  06/27/2012 9:31 AM  Melinda [name]  has presented today for surgery, with the diagnosis of right inguinal hernia   The various methods of treatment have been discussed with the patient and family. After consideration of risks, benefits and other options for treatment, the patient has consented to  Procedure(s) (LRB) with comments: HERNIA REPAIR INGUINAL ADULT (Right) INSERTION OF MESH (N/A) as a surgical intervention .  The patient's history has been reviewed, patient examined, no change in status, stable for surgery.  I have reviewed the patient's chart and labs.  Questions were answered to the patient's satisfaction.     Teena Mangus A

## 2012-06-27 NOTE — Anesthesia Preprocedure Evaluation (Signed)
Anesthesia Evaluation  Patient identified by MRN, date of birth, ID band Patient awake    History of Anesthesia Complications (+) PONV  Airway Mallampati: II  Neck ROM: Full    Dental  (+) Teeth Intact   Pulmonary neg pulmonary ROS,  breath sounds clear to auscultation        Cardiovascular + dysrhythmias Rate:Abnormal  NSR today   Neuro/Psych Anxiety Depression Tremors    GI/Hepatic negative GI ROS, Neg liver ROS,   Endo/Other  Hypothyroidism   Renal/GU negative Renal ROS     Musculoskeletal   Abdominal   Peds  Hematology   Anesthesia Other Findings   Reproductive/Obstetrics                           Anesthesia Physical Anesthesia Plan  ASA: II  Anesthesia Plan: General   Post-op Pain Management:    Induction: Intravenous  Airway Management Planned: LMA  Additional Equipment:   Intra-op Plan:   Post-operative Plan: Extubation in OR  Informed Consent: I have reviewed the patients History and Physical, chart, labs and discussed the procedure including the risks, benefits and alternatives for the proposed anesthesia with the patient or authorized representative who has indicated his/her understanding and acceptance.   Dental advisory given  Plan Discussed with: CRNA and Surgeon  Anesthesia Plan Comments:         Anesthesia Quick Evaluation

## 2012-06-27 NOTE — Transfer of Care (Signed)
Immediate Anesthesia Transfer of Care Note  Patient: Melinda [name]  Procedure(s) Performed: Procedure(s) (LRB) with comments: HERNIA REPAIR INGUINAL ADULT (Right) INSERTION OF MESH (Right)  Patient Location: PACU  Anesthesia Type:General  Level of Consciousness: awake, alert  and oriented  Airway & Oxygen Therapy: Patient Spontanous Breathing and Patient connected to nasal cannula oxygen  Post-op Assessment: Report given to PACU RN and Post -op Vital signs reviewed and stable  Post vital signs: Reviewed and stable  Complications: No apparent anesthesia complications

## 2012-06-27 NOTE — Consult Note (Signed)
Patient ID: Melinda [name], female DOB: 08/22/1940, 71 y.o. MRN: [id number]  Chief Complaint   Patient presents with   .  Hernia    HPI  Melinda [name] is a 71 y.o. female.  HPI  This is a pleasant female referred by Dr. Eula Listen for a symptomatic right inguinal hernia. She recently noticed a bulge in her groin. She has mild discomfort which is not refer anywhere else. She has no obstructive symptoms. She is otherwise without complaints.  Past Medical History   Diagnosis  Date   .  Depression    .  Anxiety    .  Thyroid disease    .  Hearing loss     Past Surgical History   Procedure  Date   .  Back surgery    .  Abdominal hysterectomy    .  Breast enhancement surgery    .  Hammer toe surgery     History reviewed. No pertinent family history.  Social History  History   Substance Use Topics   .  Smoking status:  Former Games developer   .  Smokeless tobacco:  Not on file      Comment: quit 15 years ago   .  Alcohol Use:  No    Allergies   Allergen  Reactions   .  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  aspirin 81 MG chewable tablet  Chew 81 mg by mouth daily.     Marland Kitchen  buPROPion (WELLBUTRIN XL) 150 MG 24 hr tablet  Take 450 mg by mouth daily.     .  clonazePAM (KLONOPIN) 0.5 MG tablet  Take 0.5 mg by mouth at bedtime as needed. For sleep     .  estradiol (ESTRACE) 2 MG tablet  Take 2 mg by mouth daily.     .  fludrocortisone (FLORINEF) 0.1 MG tablet  Take 0.3 mg by mouth daily.     Marland Kitchen  levothyroxine (SYNTHROID, LEVOTHROID) 75 MCG tablet  Take 75 mcg by mouth daily.     .  midodrine (PROAMATINE) 10 MG tablet  Take 10 mg by mouth 3 (three) times daily.     Marland Kitchen  PARoxetine (PAXIL) 40 MG tablet  Take 40 mg by mouth every morning.     .  promethazine (PHENERGAN) 25 MG tablet  Take 25 mg by mouth every 6 (six) hours as needed. For nausea     .  tetrabenazine (XENAZINE) 12.5 MG tablet  Take 12.5  mg by mouth 2 (two) times daily.      Review of Systems  Review of Systems  Constitutional: Negative for fever, chills and unexpected weight change.  HENT: Negative for hearing loss, congestion, sore throat, trouble swallowing and voice change.  Eyes: Negative for visual disturbance.  Respiratory: Negative for cough and wheezing.  Cardiovascular: Negative for chest pain, palpitations and leg swelling.  Gastrointestinal: Negative for nausea, vomiting, abdominal pain, diarrhea, constipation, blood in stool, abdominal distention and anal bleeding.  Genitourinary: Negative for hematuria, vaginal bleeding and difficulty urinating.  Musculoskeletal: Negative for arthralgias.  Skin: Negative for rash and wound.  Neurological: Negative for seizures, syncope and headaches.  Hematological: Negative for adenopathy. Does not bruise/bleed easily.  Psychiatric/Behavioral: Negative for confusion.   Blood pressure 116/83, pulse 84, temperature 97.6 F (36.4 C), temperature source Temporal, resp. rate 18, height 5'  7" (1.702 m), weight 169 lb 9.6 oz (76.93 kg).  Physical Exam  Physical Exam  Constitutional: She is oriented to person, place, and time. She appears well-developed and well-nourished. No distress.  HENT:  Head: Normocephalic and atraumatic.  Right Ear: External ear normal.  Left Ear: External ear normal.  Nose: Nose normal.  Mouth/Throat: Oropharynx is clear and moist. No oropharyngeal exudate.  Eyes: Conjunctivae normal are normal. Pupils are equal, round, and reactive to light. Right eye exhibits no discharge. Left eye exhibits no discharge. No scleral icterus.  Neck: Normal range of motion. Neck supple. No tracheal deviation present. No thyromegaly present.  Cardiovascular: Normal rate, regular rhythm, normal heart sounds and intact distal pulses.  No murmur heard.  Pulmonary/Chest: Effort normal and breath sounds normal. No respiratory distress. She has no wheezes.  Abdominal: Soft.  Bowel sounds are normal. She exhibits no distension. There is no tenderness. There is no rebound.  There is a very small reducible right inguinal hernia  Musculoskeletal: Normal range of motion. She exhibits no edema.  Lymphadenopathy:  She has no cervical adenopathy.  Neurological: She is alert and oriented to person, place, and time.  Skin: Skin is warm and dry. No rash noted. She is not diaphoretic. No erythema.  Psychiatric: Her behavior is normal. Judgment normal.   Data Reviewed  I have reviewed her CAT scan from November as well as from July which both demonstrated a right inguinal hernia containing only fat  Assessment   Right inguinal hernia   Plan   As she is symptomatic, repair with mesh was recommended. I discussed this with her in detail. I discussed the risks of surgery which includes but is not limited to bleeding, infection, nerve entrapment, chronic pain, recurrence, etc. She understands and wishes to proceed. Surgery will be scheduled. Likelihood of success is good   Lucky Trotta A

## 2012-06-27 NOTE — Preoperative (Signed)
Beta Blockers   Reason not to administer Beta Blockers:Not Applicable 

## 2012-06-27 NOTE — Anesthesia Postprocedure Evaluation (Signed)
  Anesthesia Post-op Note  Patient: Melinda [name]  Procedure(s) Performed: Procedure(s) (LRB) with comments: HERNIA REPAIR INGUINAL ADULT (Right) INSERTION OF MESH (Right)  Patient Location: PACU  Anesthesia Type:General  Level of Consciousness: awake  Airway and Oxygen Therapy: Patient Spontanous Breathing  Post-op Pain: mild  Post-op Assessment: Post-op Vital signs reviewed  Post-op Vital Signs: stable  Complications: No apparent anesthesia complications

## 2012-06-27 NOTE — Op Note (Signed)
HERNIA REPAIR INGUINAL ADULT, INSERTION OF MESH  Procedure Note  CAPUCINE TRYON 06/27/2012   Pre-op Diagnosis: Right inguinal hernia      Post-op Diagnosis: same  Procedure(s): HERNIA REPAIR INGUINAL ADULT INSERTION OF MESH (parietex proceed progrip)  Surgeon(s): Shelly Rubenstein, MD  Anesthesia: General  Staff:  Netta Corrigan, RN - Circulator Johnnye Sima, CST - Scrub Person Evelena Peat, RN - Circulator Assistant Janeece Agee Pingue, CST - Relief Scrub Gerre Pebbles Sipsis, RN - Relief Circulator  Estimated Blood Loss: Minimal               Procedure: The patient was brought to the operating room and identified as the correct patient. She was placed supine on the operating room table and general anesthesia was induced. A right lower quadrant was then prepped and draped in the usual sterile fashion. I performed an ilioinguinal nerve block with Marcaine. I then anesthetized the skin of the" Marcaine as well. I then made a longitudinal incision with the scalpel. I took this down through Scarpa's fascia with the electrocautery. Identified the external oblique muscles an opening for the internal and external ring. The patient had a small direct hernia defect. I brought a piece of parietex pro-grip Prolene mesh Onto the field. I placed it as an on lay on the inguinal floor. I then sewed it in place with several interrupted 2-0 Vicryl sutures. Good coverage of the inguinal floor appeared to be achieved. I then closed the external oblique fascia with a running 2 procedure. I anesthetized the wound for marking. I closed Scarpa's fascia with interrupted preoperative sutures and closed the skin with a running 4-0 Monocryl. Steri-Strips, gauze, and Tegaderm were then applied. The patient tolerated the procedure well. All counts were correct at the end of the procedure. The patient was then extubated in the operating room and taken in stable condition to the recovery room.           Jennifer Holland A   Date: 06/27/2012  Time: 10:59 AM

## 2012-06-30 ENCOUNTER — Encounter (HOSPITAL_COMMUNITY): Payer: Self-pay | Admitting: Surgery

## 2012-07-03 ENCOUNTER — Telehealth (INDEPENDENT_AMBULATORY_CARE_PROVIDER_SITE_OTHER): Payer: Self-pay | Admitting: General Surgery

## 2012-07-03 NOTE — Telephone Encounter (Signed)
Pt called for refill of pain meds.  Hydrocodone 5/325 mg,  #30, 1-2 po Q4-6H prn pain, no refill called to [facility]:  [phone], per standing orders.

## 2012-07-07 ENCOUNTER — Telehealth (INDEPENDENT_AMBULATORY_CARE_PROVIDER_SITE_OTHER): Payer: Self-pay | Admitting: General Surgery

## 2012-07-07 NOTE — Telephone Encounter (Signed)
Per VO Dr. Magnus Ivan, called in Hydrocodone 5/325 mg, # 30, 1-2 po Q4-6H prn pain, no refill to [facility]:  [phone].  Pt is aware.

## 2012-07-07 NOTE — Telephone Encounter (Signed)
error 

## 2012-07-07 NOTE — Telephone Encounter (Signed)
Pt called to ask about the swelling in her groin; she had IHR surgery.  Pt admits she has been on her feet "pretty constantly" to get ready for Christmas, with very little time sitting to rest or use the ice pack.  Stressed importance of rest and ice to reduce the swelling; also use NSAIDs for the inflammation.  Pt asked if she can have another Rx for pain meds.  She has already had the Standing Protocol refill of Vicodin.  Please advise.

## 2012-07-21 ENCOUNTER — Encounter (INDEPENDENT_AMBULATORY_CARE_PROVIDER_SITE_OTHER): Payer: Medicare Other | Admitting: Surgery

## 2012-07-22 ENCOUNTER — Encounter (HOSPITAL_COMMUNITY): Payer: Self-pay | Admitting: *Deleted

## 2012-07-22 ENCOUNTER — Inpatient Hospital Stay (HOSPITAL_COMMUNITY)
Admission: EM | Admit: 2012-07-22 | Discharge: 2012-07-24 | DRG: 309 | Disposition: A | Discharge status: Home / Self Care | Payer: Medicare PPO | Attending: Internal Medicine | Admitting: Internal Medicine

## 2012-07-22 ENCOUNTER — Observation Stay (HOSPITAL_COMMUNITY): Payer: Medicare PPO

## 2012-07-22 ENCOUNTER — Emergency Department (HOSPITAL_COMMUNITY): Payer: Medicare PPO

## 2012-07-22 ENCOUNTER — Encounter (INDEPENDENT_AMBULATORY_CARE_PROVIDER_SITE_OTHER): Payer: Self-pay | Admitting: Surgery

## 2012-07-22 ENCOUNTER — Ambulatory Visit (INDEPENDENT_AMBULATORY_CARE_PROVIDER_SITE_OTHER): Payer: Medicare PPO | Admitting: Surgery

## 2012-07-22 VITALS — BP 130/88 | HR 84 | Temp 98.4°F | Resp 16 | Ht 67.0 in | Wt 169.6 lb

## 2012-07-22 DIAGNOSIS — Z87891 Personal history of nicotine dependence: Secondary | ICD-10-CM

## 2012-07-22 DIAGNOSIS — Z7901 Long term (current) use of anticoagulants: Secondary | ICD-10-CM

## 2012-07-22 DIAGNOSIS — E039 Hypothyroidism, unspecified: Secondary | ICD-10-CM | POA: Diagnosis present

## 2012-07-22 DIAGNOSIS — R778 Other specified abnormalities of plasma proteins: Secondary | ICD-10-CM | POA: Diagnosis present

## 2012-07-22 DIAGNOSIS — Z09 Encounter for follow-up examination after completed treatment for conditions other than malignant neoplasm: Secondary | ICD-10-CM

## 2012-07-22 DIAGNOSIS — I4891 Unspecified atrial fibrillation: Principal | ICD-10-CM | POA: Diagnosis present

## 2012-07-22 DIAGNOSIS — F3289 Other specified depressive episodes: Secondary | ICD-10-CM | POA: Diagnosis present

## 2012-07-22 DIAGNOSIS — Z79899 Other long term (current) drug therapy: Secondary | ICD-10-CM

## 2012-07-22 DIAGNOSIS — R7989 Other specified abnormal findings of blood chemistry: Secondary | ICD-10-CM | POA: Diagnosis present

## 2012-07-22 DIAGNOSIS — R300 Dysuria: Secondary | ICD-10-CM | POA: Diagnosis present

## 2012-07-22 DIAGNOSIS — F411 Generalized anxiety disorder: Secondary | ICD-10-CM | POA: Diagnosis present

## 2012-07-22 DIAGNOSIS — N390 Urinary tract infection, site not specified: Secondary | ICD-10-CM | POA: Diagnosis present

## 2012-07-22 DIAGNOSIS — I951 Orthostatic hypotension: Secondary | ICD-10-CM | POA: Diagnosis present

## 2012-07-22 DIAGNOSIS — F419 Anxiety disorder, unspecified: Secondary | ICD-10-CM

## 2012-07-22 DIAGNOSIS — F329 Major depressive disorder, single episode, unspecified: Secondary | ICD-10-CM | POA: Diagnosis present

## 2012-07-22 DIAGNOSIS — Z23 Encounter for immunization: Secondary | ICD-10-CM

## 2012-07-22 DIAGNOSIS — R1310 Dysphagia, unspecified: Secondary | ICD-10-CM | POA: Diagnosis present

## 2012-07-22 LAB — CBC WITH DIFFERENTIAL/PLATELET
Basophils Absolute: 0.1 10*3/uL (ref 0.0–0.1)
Basophils Relative: 1 % (ref 0–1)
Hemoglobin: 15.4 g/dL — ABNORMAL HIGH (ref 12.0–15.0)
Lymphocytes Relative: 42 % (ref 12–46)
MCHC: 34.6 g/dL (ref 30.0–36.0)
Monocytes Relative: 7 % (ref 3–12)
Neutro Abs: 4.4 10*3/uL (ref 1.7–7.7)
Neutrophils Relative %: 47 % (ref 43–77)
RDW: 14 % (ref 11.5–15.5)
WBC: 9.4 10*3/uL (ref 4.0–10.5)

## 2012-07-22 LAB — PROTIME-INR
INR: 0.99 (ref 0.00–1.49)
Prothrombin Time: 13 seconds (ref 11.6–15.2)

## 2012-07-22 LAB — BASIC METABOLIC PANEL
BUN: 25 mg/dL — ABNORMAL HIGH (ref 6–23)
Chloride: 107 mEq/L (ref 96–112)
GFR calc Af Amer: 80 mL/min — ABNORMAL LOW (ref >90)
GFR calc non Af Amer: 69 mL/min — ABNORMAL LOW (ref >90)
Potassium: 3.9 mEq/L (ref 3.5–5.1)

## 2012-07-22 LAB — POCT I-STAT TROPONIN I

## 2012-07-22 MED ORDER — METOPROLOL TARTRATE 1 MG/ML IV SOLN
2.5000 mg | Freq: Once | INTRAVENOUS | Status: AC
  Administered 2012-07-22: 2.5 mg via INTRAVENOUS
  Filled 2012-07-22: qty 5

## 2012-07-22 MED ORDER — ALUM & MAG HYDROXIDE-SIMETH 200-200-20 MG/5ML PO SUSP: 30.0000 mL | Freq: Four times a day (QID) | ORAL | Status: DC | PRN

## 2012-07-22 MED ORDER — METOPROLOL TARTRATE 25 MG PO TABS: 25.0000 mg | ORAL_TABLET | Freq: Three times a day (TID) | ORAL | Status: DC

## 2012-07-22 MED ORDER — BUPROPION HCL ER (XL) 300 MG PO TB24
450.0000 mg | ORAL_TABLET | Freq: Every day | ORAL | Status: DC
  Administered 2012-07-23 – 2012-07-24 (×2): 450 mg via ORAL
  Filled 2012-07-22 (×2): qty 1

## 2012-07-22 MED ORDER — ACETAMINOPHEN 325 MG PO TABS
650.0000 mg | ORAL_TABLET | Freq: Four times a day (QID) | ORAL | Status: DC | PRN
  Administered 2012-07-22 – 2012-07-23 (×3): 650 mg via ORAL
  Filled 2012-07-22 (×3): qty 2

## 2012-07-22 MED ORDER — LEVOTHYROXINE SODIUM 75 MCG PO TABS
75.0000 ug | ORAL_TABLET | Freq: Every day | ORAL | Status: DC
  Administered 2012-07-23 – 2012-07-24 (×2): 75 ug via ORAL
  Filled 2012-07-22 (×3): qty 1

## 2012-07-22 MED ORDER — IOHEXOL 350 MG/ML SOLN
100.0000 mL | Freq: Once | INTRAVENOUS | Status: AC | PRN
  Administered 2012-07-22: 100 mL via INTRAVENOUS

## 2012-07-22 MED ORDER — ONDANSETRON HCL 4 MG/2ML IJ SOLN: 4.0000 mg | Freq: Four times a day (QID) | INTRAMUSCULAR | Status: DC | PRN

## 2012-07-22 MED ORDER — PAROXETINE HCL 20 MG PO TABS: 40.0000 mg | ORAL_TABLET | Freq: Every day | ORAL | Status: DC

## 2012-07-22 MED ORDER — METOPROLOL SUCCINATE ER 25 MG PO TB24
25.0000 mg | ORAL_TABLET | Freq: Every day | ORAL | Status: DC
  Administered 2012-07-22 – 2012-07-24 (×2): 25 mg via ORAL
  Filled 2012-07-22 (×3): qty 1

## 2012-07-22 MED ORDER — SODIUM CHLORIDE 0.9 % IJ SOLN
3.0000 mL | Freq: Two times a day (BID) | INTRAMUSCULAR | Status: DC
  Administered 2012-07-22 – 2012-07-23 (×2): 3 mL via INTRAVENOUS

## 2012-07-22 MED ORDER — WARFARIN VIDEO: Freq: Once | Status: DC

## 2012-07-22 MED ORDER — FLUDROCORTISONE ACETATE 0.1 MG PO TABS
0.3000 mg | ORAL_TABLET | Freq: Every day | ORAL | Status: DC
  Administered 2012-07-23 – 2012-07-24 (×2): 0.3 mg via ORAL
  Filled 2012-07-22 (×2): qty 3

## 2012-07-22 MED ORDER — ENOXAPARIN SODIUM 40 MG/0.4ML ~~LOC~~ SOLN
40.0000 mg | SUBCUTANEOUS | Status: DC
  Filled 2012-07-22 (×2): qty 0.4

## 2012-07-22 MED ORDER — METOPROLOL SUCCINATE ER 25 MG PO TB24: 25.0000 mg | ORAL_TABLET | Freq: Every day | ORAL | Status: DC

## 2012-07-22 MED ORDER — ACETAMINOPHEN 650 MG RE SUPP: 650.0000 mg | Freq: Four times a day (QID) | RECTAL | Status: DC | PRN

## 2012-07-22 MED ORDER — WARFARIN SODIUM 5 MG PO TABS: 5.0000 mg | ORAL_TABLET | Freq: Every day | ORAL | Status: DC

## 2012-07-22 MED ORDER — WARFARIN SODIUM 5 MG PO TABS
5.0000 mg | ORAL_TABLET | Freq: Once | ORAL | Status: AC
  Administered 2012-07-22: 5 mg via ORAL
  Filled 2012-07-22: qty 1

## 2012-07-22 MED ORDER — ONDANSETRON HCL 4 MG PO TABS: 4.0000 mg | ORAL_TABLET | Freq: Four times a day (QID) | ORAL | Status: DC | PRN

## 2012-07-22 MED ORDER — PAROXETINE HCL 20 MG PO TABS
40.0000 mg | ORAL_TABLET | Freq: Every day | ORAL | Status: DC
  Administered 2012-07-23 – 2012-07-24 (×2): 40 mg via ORAL
  Filled 2012-07-22 (×2): qty 2

## 2012-07-22 MED ORDER — MIDODRINE HCL 5 MG PO TABS
10.0000 mg | ORAL_TABLET | Freq: Three times a day (TID) | ORAL | Status: DC
  Administered 2012-07-23 – 2012-07-24 (×4): 10 mg via ORAL
  Filled 2012-07-22 (×7): qty 2

## 2012-07-22 MED ORDER — COUMADIN BOOK
Freq: Once | Status: DC
  Filled 2012-07-22: qty 1

## 2012-07-22 MED ORDER — TETRABENAZINE 12.5 MG PO TABS: 12.5000 mg | ORAL_TABLET | Freq: Two times a day (BID) | ORAL | Status: DC

## 2012-07-22 MED ORDER — CLONAZEPAM 0.5 MG PO TABS
0.5000 mg | ORAL_TABLET | Freq: Every evening | ORAL | Status: DC | PRN
  Administered 2012-07-22 – 2012-07-23 (×2): 0.5 mg via ORAL
  Filled 2012-07-22 (×2): qty 1

## 2012-07-22 MED ORDER — MIDODRINE HCL 5 MG PO TABS: 10.0000 mg | ORAL_TABLET | Freq: Three times a day (TID) | ORAL | Status: DC

## 2012-07-22 NOTE — ED Notes (Signed)
Per pt request an email was sent to her place of employment informing them that she is being admitted to the hospital & will not be able to come into work tomorrow

## 2012-07-22 NOTE — ED Notes (Signed)
PT started to complain about dizziness during ambulation. PT heart rate rose to about 132 and dropped back down to 85 when she sat down again.

## 2012-07-22 NOTE — ED Provider Notes (Signed)
6:33 PM Patient c/o LH with minimal ambulation.  HR into the 130's.  This improved quickly with rest.  She will be admitted for further E/M.  Gerhard Munch, MD 07/22/12 [phone]

## 2012-07-22 NOTE — H&P (Signed)
[facility] History and Physical  LILLIEN PETRONIO ZOX:[id number] DOB: 02/06/1941 DOA: 07/22/2012  Referring physician: ED PCP: Lillia Mountain, MD  Specialists: Donato Schultz   Chief Complaint:  Chief Complaint  Patient presents with  . Dizziness     HPI: Melinda [name] is a 72 y.o. female with hx of orthostasis, PAF, who had hernia repair last month. She did well and today went to the regular postop visit. At the [facility] office she was found to have tachycardia, almost passed out in the office and was sent to the ED. Patient reports several of thesd episodes at home for the past month. She reports dyspnea on exertion, palpitations and passing out on the floor, where she lies for a few minutes before she gets up again. In the ED she was found to be in afib with RVR. Dr. Mayford Knife was called and she recommended admission by hospitalist for coumadin and metoprolol and monitoring. Patient currently is without chest pain and seems to be doing well as lomg as she does not try to move.   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    Past Medical History  Diagnosis Date  . Depression   . Anxiety   . Thyroid disease   . Hearing loss   . PONV (postoperative nausea and vomiting)   . Cancer     uterus removed  . Arthritis   . Hypothyroidism   . Tremors of nervous system   . Dysrhythmia     brief rapid afib 01/2012;   john griffin  md  . Orthostatic hypotension    Past Surgical History  Procedure Date  .  Back surgery   . Abdominal hysterectomy   . Breast enhancement surgery   . Hammer toe surgery   . Inguinal hernia repair 06/27/2012    Procedure: HERNIA REPAIR INGUINAL ADULT;  Surgeon: Shelly Rubenstein, MD;  Location: [facility] OR;  Service: General;  Laterality: Right;  . Insertion of mesh 06/27/2012    Procedure: INSERTION OF MESH;  Surgeon: Shelly Rubenstein, MD;  Location: [facility] OR;  Service: General;  Laterality: Right;  . Hernia repair    Social History:  reports that she has quit smoking. She does not have any smokeless tobacco history on file. She reports that she does not drink alcohol or use illicit drugs. Patient lives alone at home   Allergies  Allergen Reactions  . 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 Authorizing Provider  buPROPion (WELLBUTRIN XL) 150 MG 24 hr tablet Take 450 mg by mouth daily.   Yes Historical Provider, MD  Calcium Carbonate-Vitamin D (CALTRATE 600+D PO) Take 1 tablet by mouth daily.   Yes Historical Provider, MD  clonazePAM (KLONOPIN) 0.5 MG tablet Take 0.5 mg by mouth at bedtime as needed. For sleep   Yes Historical Provider, MD  estradiol (ESTRACE) 2 MG tablet Take 2 mg by mouth daily.   Yes Historical Provider, MD  fludrocortisone (FLORINEF) 0.1 MG tablet Take 0.3  mg by mouth daily.   Yes Historical Provider, MD  levothyroxine (SYNTHROID, LEVOTHROID) 75 MCG tablet Take 75 mcg by mouth daily.   Yes Historical Provider, MD  midodrine (PROAMATINE) 10 MG tablet Take 10 mg by mouth 3 (three) times daily.   Yes Historical Provider, MD  PARoxetine (PAXIL) 40 MG tablet Take 40 mg by mouth at bedtime.    Yes Historical Provider, MD  Pseudoephedrine-Acetaminophen (SINUS HEADACHE/NON-DROWSY PO) Take 2 tablets by mouth every 6 (six) hours as needed. For sinus headache   Yes Historical Provider, MD  tetrabenazine (XENAZINE) 12.5 MG tablet  Take 12.5 mg by mouth 2 (two) times daily.   Yes Historical Provider, MD  metoprolol succinate (TOPROL-XL) 25 MG 24 hr tablet Take 1 tablet (25 mg total) by mouth daily. 07/22/12   Joya Gaskins, MD  warfarin (COUMADIN) 5 MG tablet Take 1 tablet (5 mg total) by mouth daily. 07/22/12   Joya Gaskins, MD   Physical Exam: Filed Vitals:   07/22/12 1249 07/22/12 1400 07/22/12 1500 07/22/12 1600  BP: 109/67 125/75 114/66 124/77  Pulse: 107 104 74 86  Temp: 97.7 F (36.5 C)     TempSrc: Axillary     Resp: 20 20 21 19   SpO2: 100% 99% 98% 98%     General:  axox3  Eyes: perrla, eomi, anicteric   ENT: clear pharynx  Neck: no JVD, no thyromegaly   Cardiovascular: irreg irreg, no M  Respiratory: CTAB, no W,R,C  Abdomen: soft, NT, BS present   Skin: warm, dry   Musculoskeletal: intact  Psychiatric: euthymic  Neurologic: cn 2-12 intact, strength 5/5 all 4, sensation intact   Labs on Admission:  Basic Metabolic Panel:  Lab 07/22/12 2725  NA 142  K 3.9  CL 107  CO2 21  GLUCOSE 80  BUN 25*  CREATININE 0.84  CALCIUM 10.0  MG --  PHOS --   Liver Function Tests: No results found for this basename: AST:5,ALT:5,ALKPHOS:5,BILITOT:5,PROT:5,ALBUMIN:5 in the last 168 hours No results found for this basename: LIPASE:5,AMYLASE:5 in the last 168 hours No results found for this basename: AMMONIA:5 in the last 168 hours CBC:  Lab 07/22/12 1444  WBC 9.4  NEUTROABS 4.4  HGB 15.4*  HCT 44.5  MCV 95.9  PLT 253   Cardiac Enzymes: No results found for this basename: CKTOTAL:5,CKMB:5,CKMBINDEX:5,TROPONINI:5 in the last 168 hours  BNP (last 3 results) No results found for this basename: PROBNP:3 in the last 8760 hours CBG: No results found for this basename: GLUCAP:5 in the last 168 hours  Radiological Exams on Admission: Dg Chest Portable 1 View  07/22/2012  *RADIOLOGY REPORT*  Clinical Data: Shortness of breath.  Hypotension.  PORTABLE CHEST - 1 VIEW  Comparison: 01/31/2012   Findings: Heart size remains within normal limits.  Mild tortuosity of the thoracic aorta stable.  Both lungs are clear.  No evidence of pleural effusion.  No mass or lymphadenopathy identified.  IMPRESSION: Stable exam.  No active disease.   Original Report Authenticated By: Myles Rosenthal, M.D.     EKG: Independently reviewed. afib with RVR, no St changes  Assessment/Plan   Atrial fibrillation with RVR Orthostasis  DEPRESSION  DYSPHAGIA UNSPECIFIED  Hypothyroidism  Anxiety    1. Afib with RVR - unclear etiology - doubt ACS, patient had a normal cath. She has hypothyroididsm and is closely watched so doubt she has thyrotoxicosis, . May benefit from holter, echo (although I suspect she may have had one in the office). Now in the  acute setting with syncope, afib, DOE post surgery we need to R/o PE - so patient will have a CTA tonight. We will start full dose coumadin as suggested by Dr. Mayford Knife - also we will try to use BB to see if patient tolerates.  2. Hypothyroidism - last TSH from July 3.554. Defer to Dr. Valentina Lucks need for adjustment  3. Chronic anxiety - paxil may cause orthostasis - again defer to Dr. Valentina Lucks  4. Orthostasis - chronic long standing - would cont home meds    Code Status: full  Family Communication: daughter called by ED  Disposition Plan: home   Time spent: 40 minutes  Anaysia Germer [facility] Pager [phone]  If 7PM-7AM, please contact night-coverage [email or web address] Password [facility] 07/22/2012, 7:33 PM

## 2012-07-22 NOTE — Progress Notes (Addendum)
Subjective:     Patient ID: Melinda [name], female   DOB: [birth year], 72 y.o.   MRN: [id number]  HPI She is here for her first postop visit status post right inguinal hernia repair with mesh. She is concerned about a small bulge in the right groin  Review of Systems     Objective:   Physical Exam On exam, her incision is well-healed. There is a small fluid collection below this inferior to the inguinal ligament    Assessment:     Patient stable postop    Plan:     I believe this is fluid that had collected. I want to reevaluate This in 3 months to see if it persists.  She may otherwise return to normal activity      When the patient was checkout line, she became diaphoretic and faint feeling. She also complained of some throat swelling. Because of this, EMS has been called and we'll take her to the hospital for evaluation from the emergency room physicians.

## 2012-07-22 NOTE — ED Notes (Signed)
Pt in from [facility] via [facility], pt was being seen today for post op check up for hernia repair, pt began to feel dizzy & pt was placed in back room, pt was tachycardic, pt anxious, pt A&O x4, follows commands, speaks in complete sentences, pt in a fib in route

## 2012-07-22 NOTE — ED Notes (Signed)
Per pt request, the pts daughter was called @ [phone] & a message was left re: the pts status

## 2012-07-22 NOTE — ED Notes (Signed)
Pt daughter, Irving Burton updated on plan of care

## 2012-07-22 NOTE — Progress Notes (Signed)
ANTICOAGULATION CONSULT NOTE - Initial Consult  Pharmacy Consult for Coumadin Indication: atrial fibrillation  Allergies  Allergen Reactions  . 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   Medical History: Past Medical History  Diagnosis Date  . Depression   . Anxiety   . Thyroid disease   . Hearing loss   . PONV (postoperative nausea and vomiting)   . Cancer     uterus removed  . Arthritis   . Hypothyroidism   . Tremors of nervous system   . Dysrhythmia     brief rapid afib 01/2012;   john griffin  md  . Orthostatic hypotension     Medications:  Scheduled:    . [COMPLETED] metoprolol  2.5 mg Intravenous Once  . metoprolol succinate  25 mg Oral Daily  . [COMPLETED] warfarin  5 mg Oral Once    Assessment: Melinda [name] is a 72 yo admitted for dizziness. In the ED, she was found to be in Afib with RVR. Plans are for beta blockade and Coumadin initiation. Noted CHADS2= 0, noted plans for Lovenox 40mg  SQ daily until INR is at goal. H/H and plts are normal, INR is normal at baseline. Noted patient has received Coumadin 5mg  that was ordered by the MD earlier this evening.  Goal of Therapy:  INR 2-3 Monitor platelets by anticoagulation protocol: Yes   Plan:  - No further Coumadin today - Will plan to check daily INR - Complete Coumadin education in AM  Thanks, Adalie Mand K. Allena Katz, PharmD, BCPS.  Clinical Pharmacist Pager  [phone]. 07/22/2012 8:52 PM

## 2012-07-22 NOTE — ED Provider Notes (Signed)
History     CSN: [id number]  Arrival date & time 07/22/12  1237   First MD Initiated Contact with Patient 07/22/12 1254      Chief complaint - weakness  Patient is a 72 y.o. female presenting with weakness. The history is provided by the patient.  Weakness The primary symptoms include dizziness. Primary symptoms do not include syncope, loss of consciousness, seizures, focal weakness, fever or vomiting. Episode onset: earlier today. The symptoms are improving. The neurological symptoms are diffuse.  Dizziness also occurs with weakness. Dizziness does not occur with vomiting.  Additional symptoms include weakness.  pt presents fatigue, short of breath and chest tightness.  She reports that she was at her postop appointment (she had recent hernia repair) and she felt short breath, throat tightness and some chest tightness.  No syncope. She reports she had an episode earlier in the day similar to this.      Past Medical History  Diagnosis Date  . Depression   . Anxiety   . Thyroid disease   . Hearing loss   . PONV (postoperative nausea and vomiting)   . Cancer     uterus removed  . Arthritis   . Hypothyroidism   . Tremors of nervous system   . Dysrhythmia     brief rapid afib 01/2012;   john griffin  md  . Orthostatic hypotension     Past Surgical History  Procedure Date  . Back surgery   . Abdominal hysterectomy   . Breast enhancement surgery   . Hammer toe surgery   . Inguinal hernia repair 06/27/2012    Procedure: HERNIA REPAIR INGUINAL ADULT;  Surgeon: Shelly Rubenstein, MD;  Location: [facility] OR;  Service: General;  Laterality: Right;  . Insertion of mesh 06/27/2012    Procedure: INSERTION OF MESH;  Surgeon: Shelly Rubenstein, MD;  Location: [facility] OR;  Service: General;  Laterality: Right;  . Hernia repair     No family history on file.  History  Substance Use Topics  . Smoking status: Former Games developer  . Smokeless tobacco: Not on file     Comment: quit 15 years ago  .  Alcohol Use: No    OB History    Grav Para Term Preterm Abortions TAB SAB Ect Mult Living                  Review of Systems  Constitutional: Positive for fatigue. Negative for fever.  Respiratory: Positive for chest tightness and shortness of breath.   Cardiovascular: Positive for palpitations. Negative for syncope.  Gastrointestinal: Negative for vomiting.  Neurological: Positive for dizziness and weakness. Negative for focal weakness, seizures and loss of consciousness.  Psychiatric/Behavioral: Negative for agitation.  All other systems reviewed and are negative.    Allergies  Other  Home Medications   Current Outpatient Rx  Name  Route  Sig  Dispense  Refill  . BUPROPION HCL ER (XL) 150 MG PO TB24   Oral   Take 450 mg by mouth daily.         Marland Kitchen CALTRATE 600+D PO   Oral   Take 1 tablet by mouth daily.         Marland Kitchen CLONAZEPAM 0.5 MG PO TABS   Oral   Take 0.5 mg by mouth at bedtime as needed. For sleep         . ESTRADIOL 2 MG PO TABS   Oral   Take 2 mg by mouth daily.         Marland Kitchen  FLUDROCORTISONE ACETATE 0.1 MG PO TABS   Oral   Take 0.3 mg by mouth daily.         Marland Kitchen LEVOTHYROXINE SODIUM 75 MCG PO TABS   Oral   Take 75 mcg by mouth daily.         Marland Kitchen MIDODRINE HCL 10 MG PO TABS   Oral   Take 10 mg by mouth 3 (three) times daily.         Marland Kitchen PAROXETINE HCL 40 MG PO TABS   Oral   Take 40 mg by mouth at bedtime.          Marland Kitchen SINUS HEADACHE/NON-DROWSY PO   Oral   Take 2 tablets by mouth every 6 (six) hours as needed. For sinus headache         . TETRABENAZINE 12.5 MG PO TABS   Oral   Take 12.5 mg by mouth 2 (two) times daily.           BP 109/67  Pulse 107  Temp 97.7 F (36.5 C) (Axillary)  Resp 20  SpO2 100% BP 124/77  Pulse 86  Temp 97.7 F (36.5 C) (Axillary)  Resp 19  SpO2 98%   Physical Exam CONSTITUTIONAL: Well developed/well nourished HEAD AND FACE: Normocephalic/atraumatic EYES: EOMI/PERRL ENMT: Mucous membranes  moist NECK: supple no meningeal signs SPINE:entire spine nontender CV: tachycardic, irregular, no loud murmurs  LUNGS: Lungs are clear to auscultation bilaterally, no apparent distress ABDOMEN: soft, nontender, no rebound or guarding GU:no cva tenderness NEURO: Pt is awake/alert, moves all extremitiesx4 EXTREMITIES: full ROM, no calf tenderness SKIN: warm, color normal PSYCH: flat affect  ED Course  Procedures    4:21 PM Pt with afib.  Per chart, she had cath in 01/2012 that showed normal coronaries.  She was found to have paroxysmal afib and had recently been in sinus rhythm.  She is now back in afib with intermittent rvr.  She is otherwise at her baseline. I spoke to dr turner (on call for [facility]) feels she should be started on coumadin 5mg  PO and toprol xl 25mg  and see dr Anne Fu tomorrow at 0915am After ambulation pt is tachycardic.  She is mildly symptomatic.  Will give an IV dose of lopressor and oral load.  If this does not resolve symptoms will need admission D/w dr Jeraldine Loots to f/u on response to meds Pt understands plan  MDM  Nursing notes including past medical history and social history reviewed and considered in documentation xrays reviewed and considered Labs/vital reviewed and considered Previous records reviewed and considered - cardiac cath in 2013 reviewed - negative        Date: 07/22/2012  Rate: 127  Rhythm: atrial fibrillation  QRS Axis: normal  Intervals: normal  ST/T Wave abnormalities: nonspecific ST changes  Conduction Disutrbances:none  Narrative Interpretation:   Old EKG Reviewed: rate is faster    Joya Gaskins, MD 07/22/12 1624

## 2012-07-22 NOTE — ED Notes (Signed)
Per pt request, a message was left for her neighbor, Annette Stable to call the ED to be updated on her status & to request  He feed her animals in her home, awaiting return call

## 2012-07-23 ENCOUNTER — Encounter (HOSPITAL_COMMUNITY): Payer: Self-pay | Admitting: General Practice

## 2012-07-23 DIAGNOSIS — I951 Orthostatic hypotension: Secondary | ICD-10-CM | POA: Diagnosis present

## 2012-07-23 DIAGNOSIS — R300 Dysuria: Secondary | ICD-10-CM | POA: Diagnosis present

## 2012-07-23 LAB — URINALYSIS, ROUTINE W REFLEX MICROSCOPIC
Glucose, UA: NEGATIVE mg/dL
Specific Gravity, Urine: <1.005 — ABNORMAL LOW (ref 1.005–1.030)

## 2012-07-23 LAB — BASIC METABOLIC PANEL
CO2: 25 mEq/L (ref 19–32)
Calcium: 9.8 mg/dL (ref 8.4–10.5)
Glucose, Bld: 88 mg/dL (ref 70–99)
Sodium: 141 mEq/L (ref 135–145)

## 2012-07-23 LAB — CBC
Hemoglobin: 15.3 g/dL — ABNORMAL HIGH (ref 12.0–15.0)
MCH: 32 pg (ref 26.0–34.0)
MCV: 97.7 fL (ref 78.0–100.0)
Platelets: 281 10*3/uL (ref 150–400)
RBC: 4.78 MIL/uL (ref 3.87–5.11)

## 2012-07-23 MED ORDER — WARFARIN - PHARMACIST DOSING INPATIENT: Freq: Every day | Status: DC

## 2012-07-23 MED ORDER — FLECAINIDE ACETATE 50 MG PO TABS
50.0000 mg | ORAL_TABLET | Freq: Two times a day (BID) | ORAL | Status: DC
  Administered 2012-07-23 – 2012-07-24 (×3): 50 mg via ORAL
  Filled 2012-07-23 (×4): qty 1

## 2012-07-23 MED ORDER — INFLUENZA VIRUS VACC SPLIT PF IM SUSP
0.5000 mL | INTRAMUSCULAR | Status: AC
  Administered 2012-07-24: 0.5 mL via INTRAMUSCULAR
  Filled 2012-07-23: qty 0.5

## 2012-07-23 MED ORDER — RIVAROXABAN 20 MG PO TABS
20.0000 mg | ORAL_TABLET | Freq: Every day | ORAL | Status: DC
  Administered 2012-07-23: 20 mg via ORAL
  Filled 2012-07-23 (×2): qty 1

## 2012-07-23 MED ORDER — CIPROFLOXACIN HCL 250 MG PO TABS
250.0000 mg | ORAL_TABLET | Freq: Two times a day (BID) | ORAL | Status: DC
  Administered 2012-07-23 – 2012-07-24 (×2): 250 mg via ORAL
  Filled 2012-07-23 (×4): qty 1

## 2012-07-23 MED ORDER — SODIUM CHLORIDE 0.9 % IV SOLN
INTRAVENOUS | Status: DC
  Administered 2012-07-23: 09:00:00 via INTRAVENOUS

## 2012-07-23 MED ORDER — WARFARIN SODIUM 5 MG PO TABS
5.0000 mg | ORAL_TABLET | Freq: Once | ORAL | Status: DC
  Filled 2012-07-23: qty 1

## 2012-07-23 NOTE — Progress Notes (Signed)
Subjective: No new complaints.  Takes midodrine at home only in am, not tid.  Some burning on urination  Objective: Vital signs in last 24 hours: Temp:  [97.3 F (36.3 C)-98.4 F (36.9 C)] 97.3 F (36.3 C) (01/08 0500) Pulse Rate:  [63-107] 104  (01/08 0707) Resp:  [16-21] 18  (01/08 0500) BP: (88-130)/(61-88) 88/63 mmHg (01/08 0707) SpO2:  [94 %-100 %] 94 % (01/08 0500) Weight:  [76.93 kg (169 lb 9.6 oz)-77.384 kg (170 lb 9.6 oz)] 77.384 kg (170 lb 9.6 oz) (01/07 2100) Weight change:  Last BM Date: 07/21/12  Intake/Output from previous day:   Intake/Output this shift:    General appearance: alert and cooperative Resp: clear to auscultation bilaterally Cardio: irregularly irregular rhythm  Lab Results:  Basename 07/23/12 0246 07/22/12 1444  WBC 8.0 9.4  HGB 15.3* 15.4*  HCT 46.7* 44.5  PLT 281 253   BMET  Basename 07/23/12 0246 07/22/12 1444  NA 141 142  K 4.0 3.9  CL 105 107  CO2 25 21  GLUCOSE 88 80  BUN 23 25*  CREATININE 1.09 0.84  CALCIUM 9.8 10.0    Studies/Results: Ct Angio Chest Pe W/cm &/or Wo Cm  07/22/2012  *RADIOLOGY REPORT*  Clinical Data: 72 year old female with shortness of breath, syncope and recent hernia surgery.  History of atrial fibrillation.  CT ANGIOGRAPHY CHEST  Technique:  Multidetector CT imaging of the chest using the standard protocol during bolus administration of intravenous contrast. Multiplanar reconstructed images including MIPs were obtained and reviewed to evaluate the vascular anatomy.  Contrast: OMNIPAQUE IOHEXOL 350 MG/ML SOLN  Comparison: 07/22/2012 and prior chest radiographs  Findings: This is a technically satisfactory study.  There are no pulmonary emboli identified. Cardiomegaly is noted. There is no evidence of thoracic aortic aneurysm. A minuscule right pleural effusion and mild right basilar atelectasis noted. No enlarged lymph nodes are identified. Bilateral breast prostheses are present.  There is no evidence of  airspace disease, consolidation, nodule, mass, or endobronchial/endotracheal lesion.  No acute or suspicious bony abnormalities are identified. The visualized upper abdomen is within normal limits.  IMPRESSION: No evidence of pulmonary emboli.  Cardiomegaly.  Minuscule right pleural effusion and minimal dependent right basilar atelectasis.  No other significant abnormalities identified.   Original Report Authenticated By: Harmon Pier, M.D.    Dg Chest Portable 1 View  07/22/2012  *RADIOLOGY REPORT*  Clinical Data: Shortness of breath.  Hypotension.  PORTABLE CHEST - 1 VIEW  Comparison: 01/31/2012  Findings: Heart size remains within normal limits.  Mild tortuosity of the thoracic aorta stable.  Both lungs are clear.  No evidence of pleural effusion.  No mass or lymphadenopathy identified.  IMPRESSION: Stable exam.  No active disease.   Original Report Authenticated By: Myles Rosenthal, M.D.     Medications: I have reviewed the patient's current medications.  Assessment/Plan: Principal Problem:  *Atrial fibrillation with RVR rate now controlled on low dose beta blocker.  Coumadin started.  Cardiology to see patient.  By history having infrequent episodes of atrial fibrillation.  She is very symptomatic from it as she is orthostatic at baseline. Active Problems:  DEPRESSION history of severe depression, continue current meds  Orthostatic hypotension she may be mildly volume depleted (BUN up), will give saline today and continue florinef and midodrine (give tid).  Recheck in am  Dysuria  Check UA and culture   LOS: 1 day   Melinda [name] JOSEPH 07/23/2012, 7:57 AM

## 2012-07-23 NOTE — Progress Notes (Signed)
Notified Dr. Pete Glatter of UA results and regarding pt beginning xeralto, orders given to make inpatient and start cipro.

## 2012-07-23 NOTE — Progress Notes (Signed)
Patient has converted to SR, HR 66, strip on chart, cont to monitor.

## 2012-07-23 NOTE — Consult Note (Signed)
Admit date: 07/22/2012 Referring Physician  Dr. Valentina Lucks Primary Physician  Dr. Valentina Lucks Primary Cardiologist  Dr. Anne Fu Reason for Consultation  Atrial fibrillation, near syncope, orthostatic hypotension  HPI: 72 year old woman who has had paroxysmal atrial fibrillation.  She had hernia surgery about 3 weeks ago.  She went to her surgeon yesterday for a followup visit.  She'll felt fine at the beginning of visit she was leaving our office, she began to feel palpitations and severe lightheadedness along with feeling warm.  She nearly passed out.  She came to the emergency room and was found to be in atrial fibrillation with rapid ventricular response.  Of note, she does have a long history of orthostatic hypotension.  She has been on medication for this problem.  Given the concerns of rate slowing drugs worsening or orthostatic hypotension, we're asked to consult for management of her atrial fibrillation.  Of note, she had cardiac cath in July showing no significant coronary artery disease.     PMH:   Past Medical History  Diagnosis Date  . Depression   . Anxiety   . Thyroid disease   . Hearing loss   . PONV (postoperative nausea and vomiting)   . Cancer     uterus removed  . Arthritis   . Hypothyroidism   . Tremors of nervous system   . Dysrhythmia     brief rapid afib 01/2012;   john griffin  md  . Orthostatic hypotension      PSH:   Past Surgical History  Procedure Date  . Back surgery   . Abdominal hysterectomy   . Breast enhancement surgery   . Hammer toe surgery   . Inguinal hernia repair 06/27/2012    Procedure: HERNIA REPAIR INGUINAL ADULT;  Surgeon: Shelly Rubenstein, MD;  Location: [facility] OR;  Service: General;  Laterality: Right;  . Insertion of mesh 06/27/2012    Procedure: INSERTION OF MESH;  Surgeon: Shelly Rubenstein, MD;  Location: [facility] OR;  Service: General;  Laterality: Right;  . Hernia repair     Allergies:  Other Prior to Admit Meds:   Prescriptions prior to  admission  Medication Sig Dispense Refill  . buPROPion (WELLBUTRIN XL) 150 MG 24 hr tablet Take 450 mg by mouth daily.      . Calcium Carbonate-Vitamin D (CALTRATE 600+D PO) Take 1 tablet by mouth daily.      . clonazePAM (KLONOPIN) 0.5 MG tablet Take 0.5 mg by mouth at bedtime as needed. For sleep      . estradiol (ESTRACE) 2 MG tablet Take 2 mg by mouth daily.      . fludrocortisone (FLORINEF) 0.1 MG tablet Take 0.3 mg by mouth daily.      Marland Kitchen levothyroxine (SYNTHROID, LEVOTHROID) 75 MCG tablet Take 75 mcg by mouth daily.      . midodrine (PROAMATINE) 10 MG tablet Take 10 mg by mouth 3 (three) times daily.      Marland Kitchen PARoxetine (PAXIL) 40 MG tablet Take 40 mg by mouth at bedtime.       . Pseudoephedrine-Acetaminophen (SINUS HEADACHE/NON-DROWSY PO) Take 2 tablets by mouth every 6 (six) hours as needed. For sinus headache      . tetrabenazine (XENAZINE) 12.5 MG tablet Take 12.5 mg by mouth 2 (two) times daily.       Fam HX:   No family history on file. Social HX:    History   Social History  . Marital Status: Divorced    Spouse Name: N/A  Number of Children: N/A  . Years of Education: N/A   Occupational History  . Not on file.   Social History Main Topics  . Smoking status: Former Games developer  . Smokeless tobacco: Not on file     Comment: quit 15 years ago  . Alcohol Use: No  . Drug Use: No  . Sexually Active:    Other Topics Concern  . Not on file   Social History Narrative  . No narrative on file     ROS:  All 11 ROS were addressed and are negative except what is stated in the HPI  Physical Exam: Blood pressure 88/63, pulse 104, temperature 97.3 F (36.3 C), temperature source Axillary, resp. rate 18, height 5\' 7"  (1.702 m), weight 77.384 kg (170 lb 9.6 oz), SpO2 94.00%.    General: Well developed, well nourished, in no acute distress Head:   Normal cephalic and atramatic  Lungs:   Clear bilaterally to auscultation and percussion. Heart:  Irregularly irregular,  S1-S2 Abdomen: abdomen soft Msk:  Back normal,  Normal strength and tone for age. Extremities:   No edema.   Neuro: Alert and oriented X 3. Psych:  Flat affect, responds appropriately    Labs:   Lab Results  Component Value Date   WBC 8.0 07/23/2012   HGB 15.3* 07/23/2012   HCT 46.7* 07/23/2012   MCV 97.7 07/23/2012   PLT 281 07/23/2012    Lab 07/23/12 0246  NA 141  K 4.0  CL 105  CO2 25  BUN 23  CREATININE 1.09  CALCIUM 9.8  PROT --  BILITOT --  ALKPHOS --  ALT --  AST --  GLUCOSE 88   No results found for this basename: PTT   Lab Results  Component Value Date   INR 1.03 07/23/2012   INR 0.99 07/22/2012   Lab Results  Component Value Date   CKTOTAL 92 02/01/2012   CKMB 3.7 02/01/2012   TROPONINI <0.30 07/23/2012     No results found for this basename: CHOL   No results found for this basename: HDL   No results found for this basename: LDLCALC   No results found for this basename: TRIG   No results found for this basename: CHOLHDL   No results found for this basename: LDLDIRECT      Radiology:  Ct Angio Chest Pe W/cm &/or Wo Cm  07/22/2012  *RADIOLOGY REPORT*  Clinical Data: 72 year old female with shortness of breath, syncope and recent hernia surgery.  History of atrial fibrillation.  CT ANGIOGRAPHY CHEST  Technique:  Multidetector CT imaging of the chest using the standard protocol during bolus administration of intravenous contrast. Multiplanar reconstructed images including MIPs were obtained and reviewed to evaluate the vascular anatomy.  Contrast: OMNIPAQUE IOHEXOL 350 MG/ML SOLN  Comparison: 07/22/2012 and prior chest radiographs  Findings: This is a technically satisfactory study.  There are no pulmonary emboli identified. Cardiomegaly is noted. There is no evidence of thoracic aortic aneurysm. A minuscule right pleural effusion and mild right basilar atelectasis noted. No enlarged lymph nodes are identified. Bilateral breast prostheses are present.  There is  no evidence of airspace disease, consolidation, nodule, mass, or endobronchial/endotracheal lesion.  No acute or suspicious bony abnormalities are identified. The visualized upper abdomen is within normal limits.  IMPRESSION: No evidence of pulmonary emboli.  Cardiomegaly.  Minuscule right pleural effusion and minimal dependent right basilar atelectasis.  No other significant abnormalities identified.   Original Report Authenticated By: Harmon Pier, M.D.  Dg Chest Portable 1 View  07/22/2012  *RADIOLOGY REPORT*  Clinical Data: Shortness of breath.  Hypotension.  PORTABLE CHEST - 1 VIEW  Comparison: 01/31/2012  Findings: Heart size remains within normal limits.  Mild tortuosity of the thoracic aorta stable.  Both lungs are clear.  No evidence of pleural effusion.  No mass or lymphadenopathy identified.  IMPRESSION: Stable exam.  No active disease.   Original Report Authenticated By: Myles Rosenthal, M.D.     EKG:  Atrial fibrillation with rapid ventricular response  ASSESSMENT: Paroxysmal atrial fibrillation.  She is quite symptomatic when she is out of rhythm.  Orthostatic hypotension.  PLAN:  Given how symptomatic she is well atrial fibrillation, rhythm control may be warranted.  In addition, rate control may worsen her orthostatic hypotension.  I agree with anticoagulation.  She would prefer use one of the novel agents that did not work are monitoring.  I think this is fine.  Discussed with electrophysiology regarding choice of antiarrhythmic.  Will try low dose flecainide.  Continue low dose metoprolol at this time as well.  Hopefully, this will not exacerbate orthostatic hypotension.  She would need an ETT after stating flecainide as an oputpatient.  Corky Crafts., MD  07/23/2012  11:46 AM

## 2012-07-23 NOTE — Care Management Note (Signed)
    Page 1 of 1   07/24/2012     10:00:32 AM   CARE MANAGEMENT NOTE 07/24/2012  Patient:  Melinda [name], Melinda [name]   Account Number:  [id number]  Date Initiated:  07/23/2012  Documentation initiated by:  GRAVES-BIGELOW,Jamise Pentland  Subjective/Objective Assessment:   Pt admitted with afib rvr. Initiated on Flecainide 50 mg q 12 hrs.     Action/Plan:   Cm will continue to monitor for disposition needs.   Anticipated DC Date:  07/25/2012   Anticipated DC Plan:  HOME/SELF CARE      DC Planning Services  CM consult      Choice offered to / List presented to:             Status of service:  Completed, signed off Medicare Important Message given?   (If response is "NO", the following Medicare IM given date fields will be blank) Date Medicare IM given:   Date Additional Medicare IM given:    Discharge Disposition:  HOME/SELF CARE  Per UR Regulation:  Reviewed for med. necessity/level of care/duration of stay  If discussed at Long Length of Stay Meetings, dates discussed:    Comments:  07-24-12 0959 Tomi Bamberger, [place] [phone] Benefits check in process for pt on xarelto and flecainide. Cm will make pt aware once completed.

## 2012-07-23 NOTE — Progress Notes (Addendum)
ANTICOAGULATION CONSULT NOTE - Follow up Consult  Pharmacy Consult for Coumadin Indication: atrial fibrillation  Allergies  Allergen Reactions  . 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    Patient Measurements: Height: 5\' 7"  (170.2 cm) Weight: 170 lb 9.6 oz (77.384 kg) IBW/kg (Calculated) : 61.6     Vital Signs: Temp: 97.3 F (36.3 C) (01/08 0500) BP: 88/63 mmHg (01/08 0707) Pulse Rate: 104  (01/08 0707)  Labs:  Basename 07/23/12 0246 07/22/12 2058 07/22/12 1615 07/22/12 1444  HGB 15.3* -- -- 15.4*  HCT 46.7* -- -- 44.5  PLT 281 -- -- 253  APTT -- -- -- --  LABPROT 13.4 -- 13.0 --  INR 1.03 -- 0.99 --  HEPARINUNFRC -- -- -- --  CREATININE 1.09 -- -- 0.84  CKTOTAL -- -- -- --  CKMB -- -- -- --  TROPONINI <0.30 <0.30 -- --    Estimated Creatinine Clearance: 51.5 ml/min (by C-G formula based on Cr of 1.09).   Medical History: Past Medical History  Diagnosis Date  . Depression   . Anxiety   . Thyroid disease   . Hearing loss   . PONV (postoperative nausea and vomiting)   . Cancer     uterus removed  . Arthritis   . Hypothyroidism   . Tremors of nervous system   . Dysrhythmia     brief rapid afib 01/2012;   Melinda griffin  md  . Orthostatic hypotension     Medications:  Scheduled:     . buPROPion  450 mg Oral Daily  . coumadin book   Does not apply Once  . enoxaparin (LOVENOX) injection  40 mg Subcutaneous Q24H  . fludrocortisone  0.3 mg Oral Daily  . levothyroxine  75 mcg Oral QAC breakfast  . [COMPLETED] metoprolol  2.5 mg Intravenous Once  . metoprolol succinate  25 mg Oral Daily  . midodrine  10 mg Oral TID WC  . PARoxetine  40 mg Oral Daily  . sodium chloride  3 mL Intravenous Q12H  . tetrabenazine  12.5 mg Oral BID  . [COMPLETED] warfarin  5 mg Oral Once  . warfarin   Does not apply Once  . [DISCONTINUED] metoprolol tartrate  25 mg Oral TID  . [DISCONTINUED] midodrine  10 mg Oral TID  . [DISCONTINUED] PARoxetine  40 mg Oral  QHS    Assessment: [name] is a 72 yo admitted for dizziness. In the ED, she was found to be in Afib with RVR.  Yesterday started on beta blockade and Coumadin. Noted CHADS2= 0.  Lovenox 40mg  SQ daily until INR is at goal. H/H and plts are normal/ stable.  INR this AM is 1.03 after 1st dose of Coumadin 5mg  last night.  Atrial fibrillation with RVR rate now controlled per IM note today. No bleeding reported.   Goal of Therapy:  INR 2-3 Monitor platelets by anticoagulation protocol: Yes   Plan:  Give Coumadin 5mg  po today Check daily INR Complete Coumadin education.   Thanks, Noah Delaine, RPh Clinical Pharmacist Pager: [phone] 07/23/2012 10:20 AM

## 2012-07-24 LAB — BASIC METABOLIC PANEL
Chloride: 108 mEq/L (ref 96–112)
Creatinine, Ser: 0.94 mg/dL (ref 0.50–1.10)
GFR calc Af Amer: 70 mL/min — ABNORMAL LOW (ref >90)
Potassium: 4 mEq/L (ref 3.5–5.1)

## 2012-07-24 LAB — PROTIME-INR
INR: 1.58 — ABNORMAL HIGH (ref 0.00–1.49)
Prothrombin Time: 18.4 seconds — ABNORMAL HIGH (ref 11.6–15.2)

## 2012-07-24 MED ORDER — RIVAROXABAN 20 MG PO TABS: 20.0000 mg | ORAL_TABLET | Freq: Every day | ORAL | Status: AC

## 2012-07-24 MED ORDER — FLECAINIDE ACETATE 50 MG PO TABS: 50.0000 mg | ORAL_TABLET | Freq: Two times a day (BID) | ORAL | Status: DC

## 2012-07-24 MED ORDER — OFF THE BEAT BOOK
Freq: Once | Status: AC
  Administered 2012-07-24: 08:00:00
  Filled 2012-07-24 (×2): qty 1

## 2012-07-24 MED ORDER — PNEUMOCOCCAL VAC POLYVALENT 25 MCG/0.5ML IJ INJ
0.5000 mL | INJECTION | Freq: Once | INTRAMUSCULAR | Status: AC
  Administered 2012-07-24: 0.5 mL via INTRAMUSCULAR
  Filled 2012-07-24: qty 0.5

## 2012-07-24 MED ORDER — CIPROFLOXACIN HCL 250 MG PO TABS: 250.0000 mg | ORAL_TABLET | Freq: Two times a day (BID) | ORAL | Status: DC

## 2012-07-24 MED ORDER — METOPROLOL SUCCINATE ER 25 MG PO TB24: 25.0000 mg | ORAL_TABLET | Freq: Every day | ORAL | Status: DC

## 2012-07-24 NOTE — Plan of Care (Signed)
Problem: Discharge Progression Outcomes Goal: INR monitor plan established Outcome: Not Applicable Date Met:  07/24/12 xeralto

## 2012-07-24 NOTE — Progress Notes (Signed)
Subjective:  Feels better than on admit. No SOB no CP no syncope.   Objective:  Vital Signs in the last 24 hours: Temp:  [98.1 F (36.7 C)-98.2 F (36.8 C)] 98.1 F (36.7 C) (01/09 0500) Pulse Rate:  [65-80] 68  (01/09 0500) Resp:  [18] 18  (01/08 1400) BP: (113-138)/(60-92) 138/92 mmHg (01/09 0505) SpO2:  [96 %-97 %] 97 % (01/09 0500) Weight:  [78.472 kg (173 lb)] 78.472 kg (173 lb) (01/09 0500)  Intake/Output from previous day: 01/08 0701 - 01/09 0700 In: 720 [P.O.:720] Out: 2000 [Urine:2000]   Physical Exam: General: Well developed, well nourished, in no acute distress. Head:  Normocephalic and atraumatic. Lungs: Clear to auscultation and percussion. Heart: Normal S1 and S2.  No murmur, rubs or gallops. Rare ectopy. Abdomen: soft, non-tender, positive bowel sounds. Extremities: No clubbing or cyanosis. No edema. Neurologic: Alert and oriented x 3.    Lab Results:  Basename 07/23/12 0246 07/22/12 1444  WBC 8.0 9.4  HGB 15.3* 15.4*  PLT 281 253    Basename 07/24/12 0545 07/23/12 0246  NA 141 141  K 4.0 4.0  CL 108 105  CO2 25 25  GLUCOSE 107* 88  BUN 16 23  CREATININE 0.94 1.09    Basename 07/23/12 0246 07/22/12 2058  TROPONINI <0.30 <0.30   Imaging: Ct Angio Chest Pe W/cm &/or Wo Cm  07/22/2012  *RADIOLOGY REPORT*  Clinical Data: 72 year old female with shortness of breath, syncope and recent hernia surgery.  History of atrial fibrillation.  CT ANGIOGRAPHY CHEST  Technique:  Multidetector CT imaging of the chest using the standard protocol during bolus administration of intravenous contrast. Multiplanar reconstructed images including MIPs were obtained and reviewed to evaluate the vascular anatomy.  Contrast: OMNIPAQUE IOHEXOL 350 MG/ML SOLN  Comparison: 07/22/2012 and prior chest radiographs  Findings: This is a technically satisfactory study.  There are no pulmonary emboli identified. Cardiomegaly is noted. There is no evidence of thoracic aortic  aneurysm. A minuscule right pleural effusion and mild right basilar atelectasis noted. No enlarged lymph nodes are identified. Bilateral breast prostheses are present.  There is no evidence of airspace disease, consolidation, nodule, mass, or endobronchial/endotracheal lesion.  No acute or suspicious bony abnormalities are identified. The visualized upper abdomen is within normal limits.  IMPRESSION: No evidence of pulmonary emboli.  Cardiomegaly.  Minuscule right pleural effusion and minimal dependent right basilar atelectasis.  No other significant abnormalities identified.   Original Report Authenticated By: Harmon Pier, M.D.    Dg Chest Portable 1 View  07/22/2012  *RADIOLOGY REPORT*  Clinical Data: Shortness of breath.  Hypotension.  PORTABLE CHEST - 1 VIEW  Comparison: 01/31/2012  Findings: Heart size remains within normal limits.  Mild tortuosity of the thoracic aorta stable.  Both lungs are clear.  No evidence of pleural effusion.  No mass or lymphadenopathy identified.  IMPRESSION: Stable exam.  No active disease.   Original Report Authenticated By: Myles Rosenthal, M.D.    Personally viewed.   Telemetry: NSR with PAC's Personally viewed.   EKG:  AFIB 127 in ED   Assessment/Plan:  Principal Problem:  *Atrial fibrillation with RVR Active Problems:  DEPRESSION  Orthostatic hypotension  Dysuria   - agree with plan. Discussed with Dr. Eldridge Dace who discussed with Dr. Ladona Ridgel of EP. Flecainide low dose begun. Will check ETT as outpt to ensure no arrhythmia with exercise.  - Will be careful with other increase in AV nodal blocking agents due to prior significant orthostatic hypotension.   -  Agree with anticoagulation. Xarelto.  Will see soon in clinic.   Duvid Smalls 07/24/2012, 10:39 AM

## 2012-07-24 NOTE — Discharge Summary (Signed)
Physician Discharge Summary  Patient ID: Melinda [name] MRN: [id number] DOB/AGE: May 21, 1941 72 y.o.  Admit date: 07/22/2012 Discharge date: 07/24/2012  Admission Diagnoses: Atrial fibrillation with rapid ventricular response Major depression Orthostatic hypotension Dysuria  Discharge Diagnoses:  Principal Problem:  *Atrial fibrillation with RVR Active Problems:  DEPRESSION  Orthostatic hypotension  UTI   Discharged Condition: good  Hospital Course: The patient was admitted after presenting her surgeon the day of admission and having a presyncopal episode in the office. She was noted to be in atrial fibrillation with rapid ventricular response and was sent to the emergency room. She does have a history of significant orthostatic hypotension. In the ER her EKG did show atrial fibrillation with rapid response. Given some metoprolol with control of her heart rate. A CT scan of the chest was done and showed no evidence of pulmonary emboli. Cardiac enzymes were negative. Chemistries and CBC was normal. The patient was admitted and placed on metoprolol. She was seen by [facility] cardiology. Because of her very symptomatic H. or fibrillation and tendency towards orthostatic hypotension she was placed on flecainide for control of her rhythm. After one dose of flecainide she converted to normal sinus rhythm. She'll will remain on flecainide, low dose of metoprolol and xarelto as an outpatient. The patient was felt to be mildly dehydrated and given 24 hours of IV fluids, she was not orthostatic when checked prior to discharge.  Diet regular CODE STATUS no CODE BLUE Activity as tolerated  Consults: cardiology  Significant Diagnostic Studies: labs: As above and radiology: CT scan: Chest as above  Treatments: IV hydration, cardiac meds: metoprolol and flecainide and anticoagulation: xarelto  Discharge Exam: Blood pressure 138/92, pulse 68, temperature 98.1 F (36.7 C), temperature source Axillary,  resp. rate 18, height 5\' 7"  (1.702 m), weight 78.472 kg (173 lb), SpO2 97.00%. Resp: clear to auscultation bilaterally Cardio: regular rate and rhythm, S1, S2 normal, no murmur, click, rub or gallop  Disposition: 01-Home or Self Care     Medication List     As of 07/24/2012  7:43 AM    STOP taking these medications         SINUS HEADACHE/NON-DROWSY PO      TAKE these medications         buPROPion 150 MG 24 hr tablet   Commonly known as: WELLBUTRIN XL   Take 450 mg by mouth daily.      CALTRATE 600+D PO   Take 1 tablet by mouth daily.      ciprofloxacin 250 MG tablet   Commonly known as: CIPRO   Take 1 tablet (250 mg total) by mouth 2 (two) times daily.      clonazePAM 0.5 MG tablet   Commonly known as: KLONOPIN   Take 0.5 mg by mouth at bedtime as needed. For sleep      estradiol 2 MG tablet   Commonly known as: ESTRACE   Take 2 mg by mouth daily.      flecainide 50 MG tablet   Commonly known as: TAMBOCOR   Take 1 tablet (50 mg total) by mouth every 12 (twelve) hours.      fludrocortisone 0.1 MG tablet   Commonly known as: FLORINEF   Take 0.3 mg by mouth daily.      levothyroxine 75 MCG tablet   Commonly known as: SYNTHROID, LEVOTHROID   Take 75 mcg by mouth daily.      metoprolol succinate 25 MG 24 hr tablet   Commonly known as:  TOPROL-XL   Take 1 tablet (25 mg total) by mouth daily.      metoprolol succinate 25 MG 24 hr tablet   Commonly known as: TOPROL-XL   Take 1 tablet (25 mg total) by mouth daily.      midodrine 10 MG tablet   Commonly known as: PROAMATINE   Take 10 mg by mouth 3 (three) times daily.      PARoxetine 40 MG tablet   Commonly known as: PAXIL   Take 40 mg by mouth at bedtime.      Rivaroxaban 20 MG Tabs   Commonly known as: XARELTO   Take 1 tablet (20 mg total) by mouth daily with supper.      tetrabenazine 12.5 MG tablet   Commonly known as: XENAZINE   Take 12.5 mg by mouth 2 (two) times daily.      warfarin 5 MG tablet    Commonly known as: COUMADIN   Take 1 tablet (5 mg total) by mouth daily.           Follow-up Information    Schedule an appointment as soon as possible for a visit with Donato Schultz, MD. (tomorrow at 915am)    Contact information:   [address] [place] [address] [phone]       Follow up with Lillia Mountain, MD. In 4 days.   Contact information:   [address], [address] Jaynie Crumble [place] [address] [phone]          Signed: Lillia Mountain 07/24/2012, 7:43 AM

## 2012-07-25 LAB — URINE CULTURE: Colony Count: >=100000

## 2012-10-20 ENCOUNTER — Encounter (INDEPENDENT_AMBULATORY_CARE_PROVIDER_SITE_OTHER): Payer: Self-pay | Admitting: Surgery

## 2012-10-20 ENCOUNTER — Telehealth (INDEPENDENT_AMBULATORY_CARE_PROVIDER_SITE_OTHER): Payer: Self-pay | Admitting: General Surgery

## 2012-10-20 ENCOUNTER — Encounter (INDEPENDENT_AMBULATORY_CARE_PROVIDER_SITE_OTHER): Payer: Self-pay

## 2012-10-20 ENCOUNTER — Ambulatory Visit (INDEPENDENT_AMBULATORY_CARE_PROVIDER_SITE_OTHER): Payer: Medicare PPO | Admitting: Surgery

## 2012-10-20 ENCOUNTER — Other Ambulatory Visit (INDEPENDENT_AMBULATORY_CARE_PROVIDER_SITE_OTHER): Payer: Self-pay | Admitting: Surgery

## 2012-10-20 ENCOUNTER — Encounter (INDEPENDENT_AMBULATORY_CARE_PROVIDER_SITE_OTHER): Payer: Self-pay | Admitting: General Surgery

## 2012-10-20 VITALS — BP 130/74 | HR 56 | Temp 98.7°F | Resp 18 | Ht 67.0 in | Wt 173.2 lb

## 2012-10-20 DIAGNOSIS — K4091 Unilateral inguinal hernia, without obstruction or gangrene, recurrent: Secondary | ICD-10-CM

## 2012-10-20 NOTE — Progress Notes (Signed)
Subjective:     Patient ID: Melinda [name], female   DOB: 1941/06/13, 72 y.o.   MRN: [id number]  HPI She is here for a followup visit. When I saw her in January, she had a syncopal episode in our office and had to be admitted to the hospital with atrial flutter. She has improved but is now on anticoagulation. She is still noticed an intermittent bulge in the right groin. This causes minimal discomfort  Review of Systems     Objective:   Physical Exam On exam, there is a hernia which is easily reducible below my incision. It appears to be below the inguinal ligament and may be a femoral hernia.    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 I can do this with MAC anesthesia. She would need to come off her anticoagulation for 5 days. We will get cardiac clearance prior to scheduling surgery.

## 2012-10-20 NOTE — Telephone Encounter (Signed)
LMOM for patient to call back and ask for Delan Ksiazek 

## 2012-11-05 ENCOUNTER — Encounter (HOSPITAL_COMMUNITY): Payer: Self-pay

## 2012-11-10 NOTE — Pre-Procedure Instructions (Signed)
Melinda [name]  11/10/2012   Your procedure is scheduled on:  Monday, May 5th.  Report to [facility] at 9:00AM.  Call this number if you have problems the morning of surgery: [phone]   Remember:   Do not eat food or drink liquids after midnight.   Take these medicines the morning of surgery with A SIP OF WATER: buPROPion (WELLBUTRIN XL), estradiol (ESTRACE), flecainide (TAMBOCOR)  levothyroxine (SYNTHROID, LEVOTHROID, metoprolol succinate (TOPROL-XL) ,midodrine (PROAMATINE), tetrabenazine (XENAZINE). Stop Aspirin, Coumadin, Plavix, Effient and Herbal meds 5 days prior to surgery.    Do not wear jewelry, make-up or nail polish.  Do not wear lotions, powders, or perfumes. You may wear deodorant.  Do not shave 48 hours prior to surgery.   Do not bring valuables to the hospital.  Contacts, dentures or bridgework may not be worn into surgery.  Leave suitcase in the car. After surgery it may be brought to your room.  For patients admitted to the hospital, checkout time is 11:00 AM the day of discharge.   Patients discharged the day of surgery will not be allowed to drive home.  Name and phone number of your driver: -   Special Instructions: Shower using CHG 2 nights before surgery and the night before surgery.  If you shower the day of surgery use CHG.  Use special wash - you have one bottle of CHG for all showers.  You should use approximately 1/3 of the bottle for each shower.   Please read over the following fact sheets that you were given: Pain Booklet, Coughing and Deep Breathing and Surgical Site Infection Prevention

## 2012-11-11 ENCOUNTER — Encounter (HOSPITAL_COMMUNITY): Payer: Self-pay

## 2012-11-11 ENCOUNTER — Encounter (HOSPITAL_COMMUNITY)
Admission: RE | Admit: 2012-11-11 | Discharge: 2012-11-11 | Disposition: A | Discharge status: Home / Self Care | Payer: Medicare PPO | Source: Ambulatory Visit | Attending: Surgery | Admitting: Surgery

## 2012-11-11 LAB — BASIC METABOLIC PANEL
BUN: 28 mg/dL — ABNORMAL HIGH (ref 6–23)
CO2: 25 mEq/L (ref 19–32)
Chloride: 103 mEq/L (ref 96–112)
GFR calc non Af Amer: 62 mL/min — ABNORMAL LOW (ref >90)
Glucose, Bld: 90 mg/dL (ref 70–99)
Potassium: 4.5 mEq/L (ref 3.5–5.1)

## 2012-11-11 LAB — CBC
HCT: 44.8 % (ref 36.0–46.0)
Hemoglobin: 15.5 g/dL — ABNORMAL HIGH (ref 12.0–15.0)
MCH: 33 pg (ref 26.0–34.0)
MCHC: 34.6 g/dL (ref 30.0–36.0)
MCV: 95.5 fL (ref 78.0–100.0)

## 2012-11-11 LAB — APTT: aPTT: 30 seconds (ref 24–37)

## 2012-11-11 NOTE — Progress Notes (Signed)
Patient's Xarelto will be stopped today. Patient stated she did not need to see our anesthesia PAC , that she informed on everything. Informed Revonda Standard [facility] of patient' cardiac hx, cardiac cath report and surgical clearance from Dr Anne Fu , stated she would review chart, and would not need to see.

## 2012-11-12 NOTE — Progress Notes (Signed)
Anesthesia chart review: Patient is a 72 year old female scheduled for recurrent right inguinal hernia repair with mesh by Dr. Magnus Ivan on 11/17/12.    History includes former smoker, postoperative nausea and vomiting, depression, anxiety, orthostatic hypotension on midodrine and Florinef, hypothyroidism, tremors, hearing loss, hearing cancer status post hysterectomy, breast enhancement surgery, prior back surgery. She was hospitalized in mid July 2013 for brief afib with RVR (that converted to NSR spontaneously) and mildly elevated troponin and ultimately had a cardiac cath showing no significant CAD. She had recurrent afib with RVR in January 2014 and was started on flecainide.  She is also on Xarelto, which she held starting yesterday for upcoming surgery. PCP is Dr. Kirby Funk.  Cardiologist is Dr. Anne Fu.  He felt she could proceed with this surgery with low overall cardiac risk.  He stated that afib may present after surgery and would further address at that time if needed.   EKG on 11/11/12 showed NSR, LAFB, anteroseptal infarct (age undetermined).   48 hour Holter in February 2014 showed SR with rare PVCs/PACs and seven minutes of afib with maximum heart rage of 115 bpm.  Treadmill stress test on 08/04/12 showed no adverse arrhythmias, normal BP response, non-specific ST segment changes.  Nuclear stress test on 08/16/2011 showed normal myocardial perfusion, post-stress EF is 91%. Low risk scan.  Cardiac cath on 01/31/2012 showed no angiographically significant CAD, normal left ventricular systolic function. LVEDP 70 mmHg. EF 65%.   Her last echo was on 01/03/01 and showed EF 60%, mild aortic sclerosis, mild TR.   2 view chest x-ray on 01/31/2012 showed emphysema, elevation of the right hemidiaphragm. 1 view chest x-ray on 07/22/12 showed no active disease.  Preoperative labs noted.  She has known PAF, but no significant CAD by 2013 catheter.  Dr. Anne Fu has cleared her for this procedure.   If no acute changes then would anticipate she could proceed as planned.  Velna Ochs [facility]/Anesthesiology Phone [phone] 11/12/2012 11:19 AM

## 2012-11-14 NOTE — Progress Notes (Signed)
Pt notified of time change;to arrive at 0730 

## 2012-11-16 MED ORDER — CEFAZOLIN SODIUM-DEXTROSE 2-3 GM-% IV SOLR
2.0000 g | INTRAVENOUS | Status: AC
  Administered 2012-11-17: 2 g via INTRAVENOUS
  Filled 2012-11-16: qty 50

## 2012-11-17 ENCOUNTER — Encounter (HOSPITAL_COMMUNITY): Payer: Self-pay | Admitting: *Deleted

## 2012-11-17 ENCOUNTER — Ambulatory Visit (HOSPITAL_COMMUNITY): Payer: Medicare PPO | Admitting: Anesthesiology

## 2012-11-17 ENCOUNTER — Encounter (HOSPITAL_COMMUNITY): Payer: Self-pay | Admitting: Vascular Surgery

## 2012-11-17 ENCOUNTER — Encounter (HOSPITAL_COMMUNITY)
Admission: RE | Disposition: A | Discharge status: Home / Self Care | Payer: Self-pay | Source: Ambulatory Visit | Attending: Surgery

## 2012-11-17 ENCOUNTER — Ambulatory Visit (HOSPITAL_COMMUNITY)
Admission: RE | Admit: 2012-11-17 | Discharge: 2012-11-17 | Disposition: A | Discharge status: Home / Self Care | Payer: Medicare PPO | Source: Ambulatory Visit | Attending: Surgery | Admitting: Surgery

## 2012-11-17 DIAGNOSIS — F3289 Other specified depressive episodes: Secondary | ICD-10-CM | POA: Insufficient documentation

## 2012-11-17 DIAGNOSIS — K4091 Unilateral inguinal hernia, without obstruction or gangrene, recurrent: Secondary | ICD-10-CM | POA: Insufficient documentation

## 2012-11-17 DIAGNOSIS — E039 Hypothyroidism, unspecified: Secondary | ICD-10-CM | POA: Insufficient documentation

## 2012-11-17 DIAGNOSIS — M129 Arthropathy, unspecified: Secondary | ICD-10-CM | POA: Insufficient documentation

## 2012-11-17 DIAGNOSIS — F411 Generalized anxiety disorder: Secondary | ICD-10-CM | POA: Insufficient documentation

## 2012-11-17 DIAGNOSIS — F329 Major depressive disorder, single episode, unspecified: Secondary | ICD-10-CM | POA: Insufficient documentation

## 2012-11-17 HISTORY — PX: INGUINAL HERNIA REPAIR: SHX194

## 2012-11-17 HISTORY — PX: INSERTION OF MESH: SHX5868

## 2012-11-17 SURGERY — REPAIR, HERNIA, INGUINAL, ADULT
Anesthesia: General | Site: Groin | Laterality: Right | Wound class: Clean

## 2012-11-17 MED ORDER — FENTANYL CITRATE 0.05 MG/ML IJ SOLN
INTRAMUSCULAR | Status: DC | PRN
  Administered 2012-11-17: 50 ug via INTRAVENOUS

## 2012-11-17 MED ORDER — MIDAZOLAM HCL 5 MG/5ML IJ SOLN
INTRAMUSCULAR | Status: DC | PRN
  Administered 2012-11-17: 2 mg via INTRAVENOUS

## 2012-11-17 MED ORDER — BUPIVACAINE-EPINEPHRINE (PF) 0.5% -1:200000 IJ SOLN
INTRAMUSCULAR | Status: AC
  Filled 2012-11-17: qty 10

## 2012-11-17 MED ORDER — ONDANSETRON HCL 4 MG/2ML IJ SOLN
INTRAMUSCULAR | Status: AC
  Filled 2012-11-17: qty 2

## 2012-11-17 MED ORDER — OXYCODONE HCL 5 MG PO TABS: 5.0000 mg | ORAL_TABLET | Freq: Once | ORAL | Status: DC | PRN

## 2012-11-17 MED ORDER — LACTATED RINGERS IV SOLN
INTRAVENOUS | Status: DC | PRN
  Administered 2012-11-17: 09:00:00 via INTRAVENOUS

## 2012-11-17 MED ORDER — HYDROMORPHONE HCL PF 1 MG/ML IJ SOLN
INTRAMUSCULAR | Status: AC
  Filled 2012-11-17: qty 1

## 2012-11-17 MED ORDER — BUPIVACAINE HCL 0.5 % IJ SOLN
INTRAMUSCULAR | Status: DC | PRN
  Administered 2012-11-17: 20 mL

## 2012-11-17 MED ORDER — PROPOFOL 10 MG/ML IV BOLUS
INTRAVENOUS | Status: DC | PRN
  Administered 2012-11-17: 120 mg via INTRAVENOUS

## 2012-11-17 MED ORDER — LACTATED RINGERS IV SOLN
INTRAVENOUS | Status: DC
  Administered 2012-11-17: 09:00:00 via INTRAVENOUS

## 2012-11-17 MED ORDER — HYDROMORPHONE HCL PF 1 MG/ML IJ SOLN
0.2500 mg | INTRAMUSCULAR | Status: DC | PRN
  Administered 2012-11-17 (×2): 0.25 mg via INTRAVENOUS

## 2012-11-17 MED ORDER — ONDANSETRON HCL 4 MG/2ML IJ SOLN
INTRAMUSCULAR | Status: DC | PRN
  Administered 2012-11-17: 4 mg via INTRAVENOUS

## 2012-11-17 MED ORDER — PROMETHAZINE HCL 25 MG/ML IJ SOLN: 6.2500 mg | INTRAMUSCULAR | Status: DC | PRN

## 2012-11-17 MED ORDER — DEXAMETHASONE SODIUM PHOSPHATE 4 MG/ML IJ SOLN
INTRAMUSCULAR | Status: DC | PRN
  Administered 2012-11-17: 8 mg via INTRAVENOUS

## 2012-11-17 MED ORDER — OXYCODONE HCL 5 MG/5ML PO SOLN: 5.0000 mg | Freq: Once | ORAL | Status: DC | PRN

## 2012-11-17 MED ORDER — OXYCODONE-ACETAMINOPHEN 5-325 MG PO TABS: 1.0000 | ORAL_TABLET | ORAL | Status: DC | PRN

## 2012-11-17 MED ORDER — 0.9 % SODIUM CHLORIDE (POUR BTL) OPTIME
TOPICAL | Status: DC | PRN
  Administered 2012-11-17: 1000 mL

## 2012-11-17 MED ORDER — LIDOCAINE HCL (CARDIAC) 20 MG/ML IV SOLN
INTRAVENOUS | Status: DC | PRN
  Administered 2012-11-17: 85 mg via INTRAVENOUS

## 2012-11-17 MED ORDER — BUPIVACAINE HCL (PF) 0.5 % IJ SOLN
INTRAMUSCULAR | Status: AC
  Filled 2012-11-17: qty 30

## 2012-11-17 MED ORDER — ONDANSETRON HCL 4 MG/2ML IJ SOLN
4.0000 mg | Freq: Four times a day (QID) | INTRAMUSCULAR | Status: DC | PRN
  Administered 2012-11-17: 4 mg via INTRAVENOUS

## 2012-11-17 SURGICAL SUPPLY — 47 items
APL SKNCLS STERI-STRIP NONHPOA (GAUZE/BANDAGES/DRESSINGS) ×2
BENZOIN TINCTURE PRP APPL 2/3 (GAUZE/BANDAGES/DRESSINGS) ×3 IMPLANT
BLADE SURG 10 STRL SS (BLADE) ×3 IMPLANT
BLADE SURG 15 STRL LF DISP TIS (BLADE) ×2 IMPLANT
BLADE SURG 15 STRL SS (BLADE) ×3
BLADE SURG ROTATE 9660 (MISCELLANEOUS) ×1 IMPLANT
CHLORAPREP W/TINT 26ML (MISCELLANEOUS) ×3 IMPLANT
CLOTH BEACON ORANGE TIMEOUT ST (SAFETY) ×3 IMPLANT
COVER SURGICAL LIGHT HANDLE (MISCELLANEOUS) ×3 IMPLANT
DRAIN PENROSE 1/2X12 LTX STRL (WOUND CARE) IMPLANT
DRAPE LAPAROTOMY TRNSV 102X78 (DRAPE) ×3 IMPLANT
DRESSING TELFA 8X3 (GAUZE/BANDAGES/DRESSINGS) ×3 IMPLANT
DRSG TEGADERM 4X4.75 (GAUZE/BANDAGES/DRESSINGS) ×3 IMPLANT
ELECT CAUTERY BLADE 6.4 (BLADE) ×3 IMPLANT
ELECT REM PT RETURN 9FT ADLT (ELECTROSURGICAL) ×3
ELECTRODE REM PT RTRN 9FT ADLT (ELECTROSURGICAL) ×2 IMPLANT
GLOVE BIO SURGEON STRL SZ 6.5 (GLOVE) ×1 IMPLANT
GLOVE BIOGEL PI IND STRL 7.5 (GLOVE) IMPLANT
GLOVE BIOGEL PI INDICATOR 7.5 (GLOVE) ×1
GLOVE ECLIPSE 7.5 STRL STRAW (GLOVE) ×1 IMPLANT
GLOVE SURG SIGNA 7.5 PF LTX (GLOVE) ×3 IMPLANT
GOWN PREVENTION PLUS XLARGE (GOWN DISPOSABLE) ×3 IMPLANT
GOWN STRL NON-REIN LRG LVL3 (GOWN DISPOSABLE) ×4 IMPLANT
KIT BASIN OR (CUSTOM PROCEDURE TRAY) ×3 IMPLANT
KIT ROOM TURNOVER OR (KITS) ×3 IMPLANT
MESH ULTRAPRO 3X6 7.6X15CM (Mesh General) ×1 IMPLANT
NDL HYPO 25GX1X1/2 BEV (NEEDLE) ×2 IMPLANT
NEEDLE HYPO 25GX1X1/2 BEV (NEEDLE) ×3 IMPLANT
NS IRRIG 1000ML POUR BTL (IV SOLUTION) ×3 IMPLANT
PACK SURGICAL SETUP 50X90 (CUSTOM PROCEDURE TRAY) ×3 IMPLANT
PAD ARMBOARD 7.5X6 YLW CONV (MISCELLANEOUS) ×5 IMPLANT
PENCIL BUTTON HOLSTER BLD 10FT (ELECTRODE) ×3 IMPLANT
SPECIMEN JAR SMALL (MISCELLANEOUS) IMPLANT
SPONGE LAP 18X18 X RAY DECT (DISPOSABLE) ×3 IMPLANT
STRIP CLOSURE SKIN 1/2X4 (GAUZE/BANDAGES/DRESSINGS) ×3 IMPLANT
SUT MON AB 4-0 PC3 18 (SUTURE) ×3 IMPLANT
SUT SILK 2 0 (SUTURE) ×3
SUT SILK 2 0 SH (SUTURE) ×1 IMPLANT
SUT SILK 2-0 18XBRD TIE 12 (SUTURE) IMPLANT
SUT VIC AB 2-0 CT1 27 (SUTURE) ×6
SUT VIC AB 2-0 CT1 TAPERPNT 27 (SUTURE) ×4 IMPLANT
SUT VIC AB 3-0 CT1 27 (SUTURE) ×3
SUT VIC AB 3-0 CT1 TAPERPNT 27 (SUTURE) ×2 IMPLANT
SYR BULB 3OZ (MISCELLANEOUS) ×1 IMPLANT
SYR CONTROL 10ML LL (SYRINGE) ×3 IMPLANT
TOWEL OR 17X24 6PK STRL BLUE (TOWEL DISPOSABLE) ×3 IMPLANT
TOWEL OR 17X26 10 PK STRL BLUE (TOWEL DISPOSABLE) ×3 IMPLANT

## 2012-11-17 NOTE — Interval H&P Note (Signed)
History and Physical Interval Note: no change in H and P  11/17/2012 9:03 AM  Melinda [name]  has presented today for surgery, with the diagnosis of recurrent right inguinal hernia  The various methods of treatment have been discussed with the patient and family. After consideration of risks, benefits and other options for treatment, the patient has consented to  Procedure(s): Repair recurrent right inguinal hernia (Right) INSERTION OF MESH (Right) as a surgical intervention .  The patient's history has been reviewed, patient examined, no change in status, stable for surgery.  I have reviewed the patient's chart and labs.  Questions were answered to the patient's satisfaction.     Kila Godina A

## 2012-11-17 NOTE — Anesthesia Postprocedure Evaluation (Signed)
Anesthesia Post Note  Patient: Melinda [name]  Procedure(s) Performed: Procedure(s) (LRB): REPAIR RIGHT RECURRENT INGUINAL HERNIA (Right) INSERTION OF MESH (Right)  Anesthesia type: general  Patient location: PACU  Post pain: Pain level controlled  Post assessment: Patient's Cardiovascular Status Stable  Last Vitals:  Filed Vitals:   11/17/12 1141  BP: 183/97  Pulse: 61  Temp:   Resp: 16    Post vital signs: Reviewed and stable  Level of consciousness: sedated  Complications: No apparent anesthesia complications

## 2012-11-17 NOTE — Preoperative (Signed)
Beta Blockers   Reason not to administer Beta Blockers:Not Applicable, Pt took metoprolol at 0230 this am

## 2012-11-17 NOTE — Transfer of Care (Signed)
Immediate Anesthesia Transfer of Care Note  Patient: Melinda [name]  Procedure(s) Performed: Procedure(s): REPAIR RIGHT RECURRENT INGUINAL HERNIA (Right) INSERTION OF MESH (Right)  Patient Location: PACU  Anesthesia Type:General  Level of Consciousness: awake, alert  and oriented  Airway & Oxygen Therapy: Patient Spontanous Breathing and Patient connected to face mask oxygen  Post-op Assessment: Report given to PACU RN, Post -op Vital signs reviewed and stable and Patient moving all extremities X 4  Post vital signs: Reviewed and stable  Complications: No apparent anesthesia complications

## 2012-11-17 NOTE — Anesthesia Procedure Notes (Signed)
Procedure Name: LMA Insertion Date/Time: 11/17/2012 9:50 AM Performed by: Quentin Ore Pre-anesthesia Checklist: Patient identified, Emergency Drugs available, Suction available, Patient being monitored and Timeout performed Patient Re-evaluated:Patient Re-evaluated prior to inductionOxygen Delivery Method: Circle system utilized Preoxygenation: Pre-oxygenation with 100% oxygen Intubation Type: IV induction Ventilation: Mask ventilation without difficulty LMA: LMA inserted LMA Size: 4.0 Number of attempts: 1 Placement Confirmation: positive ETCO2 and breath sounds checked- equal and bilateral Tube secured with: Tape Dental Injury: Teeth and Oropharynx as per pre-operative assessment

## 2012-11-17 NOTE — Op Note (Signed)
REPAIR RIGHT RECURRENT INGUINAL HERNIA, INSERTION OF MESH  Procedure Note  Melinda [name] 11/17/2012   Pre-op Diagnosis: RECURRENT RIGHT INGUINAL HERNIA     Post-op Diagnosis: same  Procedure(s): REPAIR RIGHT RECURRENT INGUINAL HERNIA INSERTION OF MESH  Surgeon(s): Shelly Rubenstein, MD  Anesthesia: General  Staff:  Circulator: Pauletta Browns, RN Relief Circulator: Verta Ellen, RN Scrub Person: Verta Ellen, RN; Doy Mince, RN  Estimated Blood Loss: Minimal                         Melinda [name]   Date: 11/17/2012  Time: 10:29 AM

## 2012-11-17 NOTE — Anesthesia Preprocedure Evaluation (Addendum)
Anesthesia Evaluation  Patient identified by MRN, date of birth, ID band Patient awake    Reviewed: Allergy & Precautions, H&P , NPO status , Patient's Chart, lab work & pertinent test results  History of Anesthesia Complications (+) PONV  Airway Mallampati: III TM Distance: >3 FB Neck ROM: Full    Dental  (+) Dental Advisory Given   Pulmonary neg pulmonary ROS, former smoker,          Cardiovascular hypertension, Pt. on medications and Pt. on home beta blockers negative cardio ROS  + dysrhythmias Atrial Fibrillation     Neuro/Psych PSYCHIATRIC DISORDERS Anxiety Depression    GI/Hepatic negative GI ROS, Neg liver ROS,   Endo/Other  Hypothyroidism   Renal/GU negative Renal ROS     Musculoskeletal   Abdominal   Peds  Hematology negative hematology ROS (+)   Anesthesia Other Findings   Reproductive/Obstetrics                          Anesthesia Physical Anesthesia Plan  ASA: III  Anesthesia Plan: General   Post-op Pain Management:    Induction: Intravenous  Airway Management Planned: LMA  Additional Equipment:   Intra-op Plan:   Post-operative Plan: Extubation in OR  Informed Consent: I have reviewed the patients History and Physical, chart, labs and discussed the procedure including the risks, benefits and alternatives for the proposed anesthesia with the patient or authorized representative who has indicated his/her understanding and acceptance.   Dental advisory given  Plan Discussed with: CRNA, Anesthesiologist and Surgeon  Anesthesia Plan Comments:        Anesthesia Quick Evaluation

## 2012-11-17 NOTE — H&P (Signed)
Melinda [name] is an 72 y.o. female.   Chief Complaint: Recurrent right inguinal hernia HPI: [name] who underwent a right inguinal hernia repair with mesh in December. She now presents with what appears to be a recurrence versus a femoral hernia. She has minimal discomfort but does have a small reducible bulge in the right groin. She has had no obstructive symptoms.   Past Medical History  Diagnosis Date  . Depression   . Anxiety   . Thyroid disease   . Hearing loss   . PONV (postoperative nausea and vomiting)   . Cancer     uterus removed  . Arthritis   . Hypothyroidism   . Tremors of nervous system   . Dysrhythmia     brief rapid afib 01/2012;   Melinda griffin  md  . Orthostatic hypotension     Past Surgical History  Procedure Laterality Date  . Back surgery    . Abdominal hysterectomy    . Breast enhancement surgery    . Hammer toe surgery    . Inguinal hernia repair  06/27/2012    Procedure: HERNIA REPAIR INGUINAL ADULT;  Surgeon: Melinda Rubenstein, MD;  Location: [facility] OR;  Service: General;  Laterality: Right;  . Insertion of mesh  06/27/2012    Procedure: INSERTION OF MESH;  Surgeon: Melinda Rubenstein, MD;  Location: [facility] OR;  Service: General;  Laterality: Right;  . Hernia repair      No family history on file. Social History:  reports that she quit smoking about 20 years ago. Her smoking use included Cigarettes. She has a 10 pack-year smoking history. She has never used smokeless tobacco. She reports that she does not drink alcohol or use illicit drugs.  Allergies:  Allergies  Allergen Reactions  . 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 No results found.  Review of Systems  Cardiovascular: Positive for palpitations.  All other systems reviewed and are negative.    There were no vitals taken for this  visit. Physical Exam  Constitutional: She is oriented to person, place, and time. She appears well-developed and well-nourished. No distress.  HENT:  Head: Normocephalic and atraumatic.  Eyes: Conjunctivae are normal. Pupils are equal, round, and reactive to light.  Neck: Normal range of motion. Neck supple.  Cardiovascular: Normal rate, regular rhythm, normal heart sounds and intact distal pulses.   Respiratory: Effort normal and breath sounds normal. No respiratory distress.  GI: Soft. Bowel sounds are normal.  Very small, reducible bulge in the right groin  Musculoskeletal: Normal range of motion.  Neurological: She is alert and oriented to person, place, and time.  Skin: Skin is warm and dry.  Psychiatric: Her behavior is normal.     Assessment/Plan Recurrent right inguinal hernia  We'll proceed with repair with mesh in the operating room. I discussed this with her in detail. I discussed the risks which include but are not limited to recurrence, infection, bleeding, chronic pain, nerve entrapment, injury to shredding structures, et Karie Soda. She understands and wishes to proceed. Surgery is scheduled. Likelihood of success is good.  Melinda [name] A 11/17/2012, 7:07 AM

## 2012-11-18 ENCOUNTER — Encounter (HOSPITAL_COMMUNITY): Payer: Self-pay | Admitting: Surgery

## 2012-11-18 NOTE — Op Note (Signed)
NAMELEMPI, EDWIN               ACCOUNT NO.:  [id number]  MEDICAL RECORD NO.:  [id number]  LOCATION:  [facility]                         FACILITY:  [facility]  PHYSICIAN:  Abigail Miyamoto, M.D. DATE OF BIRTH:  1941-05-03  DATE OF PROCEDURE:  11/17/2012 DATE OF DISCHARGE:  11/17/2012                              OPERATIVE REPORT   PREOPERATIVE DIAGNOSIS:  Recurrent right inguinal hernia.  POSTOPERATIVE DIAGNOSIS:  Recurrent right inguinal hernia.  PROCEDURE:  Repair of recurrent right inguinal hernia with mesh.  SURGEON:  Abigail Miyamoto, M.D.  ANESTHESIA:  LMA and 0.5% Marcaine.  ESTIMATED BLOOD LOSS:  Minimal.  PROCEDURE IN DETAIL:  The patient was brought to the operating room, identified as Melinda [name].  She was placed supine on the operating room table and general anesthesia was induced.  Her right lower quadrant groin were then prepped and draped in usual sterile fashion.  I performed an ilioinguinal nerve block on the right side with Marcaine. I then made a longitudinal incision through her previous scar with the scalpel.  I took this down through the Scarpa fascia with electrocautery.  External oblique fascia was identified and opened.  The patient had a recurrent inguinal hernia, which was extremely medially along the pubis.  A new hernia sac had formed.  I was able to open the sac and reduced all the contents.  I then closed the base of sac with a silk suture.  I then brought a piece of UltraPro mesh onto the field and fashioned in appropriate fashion.  I then sewed it to the pubic tubercle and around the edge of the fascia and plugged some of the part down in the small opening as well.  Good coverage of the inguinal floor appeared to be achieved.  I then closed the external oblique fascia over top of this piece of mesh with interrupted 2-0 Vicryl sutures.  I anesthetized the fascia further with Marcaine.  I closed the Scarpa fascia with interrupted 3-0 Vicryl sutures  and closed the skin with a running 4-0 Monocryl.  Steri-Strips, gauze, and Tegaderm were then applied.  The patient tolerated the procedure well.  All the counts were correct at the end of the procedure.  The patient was then extubated in the operating room and taken in stable condition to recovery room.     Abigail Miyamoto, M.D.     DB/MEDQ  D:  11/17/2012  T:  11/18/2012  Job:  [id number]

## 2012-11-24 ENCOUNTER — Telehealth (INDEPENDENT_AMBULATORY_CARE_PROVIDER_SITE_OTHER): Payer: Self-pay

## 2012-11-24 NOTE — Telephone Encounter (Signed)
The pt called for a refill on Oxycodone and asked for a follow up appointment. I scheduled her for 5/28 because she says she will be out of town from 5/20 to 5/27.  I offered the refill protocol and called in Hydrocodone 5/325 po 4 -6 hrs prn pain #30 no refills to [facility] [phone].

## 2012-12-10 ENCOUNTER — Ambulatory Visit (INDEPENDENT_AMBULATORY_CARE_PROVIDER_SITE_OTHER): Payer: Medicare PPO | Admitting: Surgery

## 2012-12-10 ENCOUNTER — Encounter (INDEPENDENT_AMBULATORY_CARE_PROVIDER_SITE_OTHER): Payer: Self-pay | Admitting: Surgery

## 2012-12-10 VITALS — BP 120/70 | HR 63 | Temp 98.0°F | Resp 18 | Ht 67.0 in | Wt 170.6 lb

## 2012-12-10 DIAGNOSIS — Z09 Encounter for follow-up examination after completed treatment for conditions other than malignant neoplasm: Secondary | ICD-10-CM

## 2012-12-10 NOTE — Progress Notes (Signed)
Subjective:     Patient ID: Melinda [name], female   DOB: Jul 23, 1940, 72 y.o.   MRN: [id number]  HPI She is here for her first postop visit status post repair of her recurrent right inguinal hernia with mesh. She is doing well and has no complaints. She has minimal discomfort  Review of Systems     Objective:   Physical Exam On exam, her incision is well healed with minimal swelling and no evidence of recurrence    Assessment:     Patient stable postop     Plan:     She may resume normal activity including lifting in one week. I will see her back as needed

## 2012-12-31 ENCOUNTER — Telehealth: Payer: Self-pay | Admitting: Neurology

## 2012-12-31 NOTE — Telephone Encounter (Signed)
Patient left message that someone called but didn't leave a message.  She is scheduled with Dr. Vickey Huger on 02-25-13, former [facility] patient, and would like to come in sooner than August.  I will forward to scheduling to see if sooner appointment available.

## 2013-01-01 ENCOUNTER — Telehealth: Payer: Self-pay | Admitting: Neurology

## 2013-01-01 NOTE — Telephone Encounter (Signed)
This is a correction for previous phone note.  The patient was not a previous Dr. Sandria Manly patient but a patient of Dr. Vickey Huger.  Her appoint is scheduled for 01-29-13.

## 2013-01-29 ENCOUNTER — Encounter: Payer: Self-pay | Admitting: Neurology

## 2013-01-29 ENCOUNTER — Ambulatory Visit (INDEPENDENT_AMBULATORY_CARE_PROVIDER_SITE_OTHER): Payer: 59 | Admitting: Neurology

## 2013-01-29 VITALS — BP 135/79 | HR 61 | Resp 16 | Ht 67.0 in | Wt 167.0 lb

## 2013-01-29 DIAGNOSIS — G20C Parkinsonism, unspecified: Secondary | ICD-10-CM

## 2013-01-29 DIAGNOSIS — G252 Other specified forms of tremor: Secondary | ICD-10-CM

## 2013-01-29 DIAGNOSIS — I4891 Unspecified atrial fibrillation: Secondary | ICD-10-CM

## 2013-01-29 DIAGNOSIS — T43591A Poisoning by other antipsychotics and neuroleptics, accidental (unintentional), initial encounter: Secondary | ICD-10-CM

## 2013-01-29 DIAGNOSIS — G20 Parkinson's disease: Secondary | ICD-10-CM

## 2013-01-29 DIAGNOSIS — G250 Essential tremor: Secondary | ICD-10-CM

## 2013-01-29 DIAGNOSIS — G251 Drug-induced tremor: Secondary | ICD-10-CM

## 2013-01-29 HISTORY — DX: Drug-induced tremor: G25.1

## 2013-01-29 MED ORDER — CARBIDOPA-LEVODOPA 25-100 MG PO TABS: 1.0000 | ORAL_TABLET | Freq: Three times a day (TID) | ORAL | Status: DC

## 2013-01-29 NOTE — Progress Notes (Signed)
[facility]  Provider:  Dr Annalese Stiner Referring Provider: Lillia Mountain, MD Primary Care Physician:  Lillia Mountain, MD  Chief Complaint  Patient presents with  . Tremors    # 10  Revisit    HPI:  Melinda [name] is a 72 y.o. female here as a referral from Dr.Plovsky and [place].  Initially seen by dr Madaline Guthrie and treated for many years with psychotropic medication and neuroleptics.   The patient was seen on 10-8- 2012 when she presented with a  Tremor , rigidity, masked face (  less facial expression, and wide open , scared looking eyes) . She had a rare blink and her affect appears somewhat flat.  Noticed  was also dysphonia and she endorsed a high level of fatigue.  She is unable to rise now or from a seated position without bracing herself.  Sitting  on the exam table she is crossing her arms in front of her and I can see a significant tremor in her right hand to a  higher amplitude than in the left hand.  I also noticed today cogwheeling or bubbles biceps.  The patient stated that the initially tried to ARTANE , which had made her sick so she was at the time not feeling impaired enough to use another tremor medication.  Meanwhile they have been some medical developments the patient was diagnosed with atrial fibrillation.  She is now on metoprolol for rate control , on flecainide, and on XERALTO for anticoagulation.  Patient and her daughter report both that Melinda [name] is easily very fatigued and lightheaded. This proceeds the diagnosis of atrial fibrillation and has not been improved by the treatment of Atrial  fibrillation. Failed artane - got sick, but developed more tremor. 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   Review of Systems: Out of a complete 14 system review, the patient complains of only the following symptoms, and all other  reviewed systems are negative. Fall risk 12 points- dysphonia , no dyphagia according tho patient, her daughter reports choking.  Right hand dominant tremor.   Stand up and go unable to rise without bracing herself.   History   Social History  . Marital Status: Divorced    Spouse Name: N/A    Number of Children: 2  . Years of Education: Masters   Occupational History  . retired    Social History Main Topics  . Smoking status: Former Smoker -- 0.50 packs/day for 20 years    Types: Cigarettes    Quit date: 07/23/1992  . Smokeless tobacco: Never Used     Comment: quit 15 years ago  . Alcohol Use: No  . Drug Use: No  . Sexually Active: Not Currently   Other Topics Concern  . Not on file   Social History Narrative   Patient lives at home alone . Patient is single. Patient is retired.   Right handed.   College- masters.   Caffeine - two cups daily.    Family History  Problem Relation Age of Onset  . Migraines Mother   . Anxiety disorder Son   . Migraines Daughter   . Anxiety disorder Daughter     Past Medical History  Diagnosis Date  . Depression   . Anxiety   . Thyroid disease   . Hearing loss   . PONV (postoperative nausea and  vomiting)   . Arthritis   . Hypothyroidism   . Tremors of nervous system   . Dysrhythmia     brief rapid afib 01/2012;   john griffin  md  . Orthostatic hypotension   . Sinusitis   . Cancer     uterus removed in 1974  . 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 Back surgery    . Abdominal hysterectomy    . Breast enhancement surgery    . Hammer toe surgery    . Inguinal hernia repair  06/27/2012    Procedure: HERNIA REPAIR INGUINAL ADULT;  Surgeon: Shelly Rubenstein, MD;  Location: [facility] OR;  Service: General;  Laterality: Right;  . Insertion of mesh  06/27/2012    Procedure: INSERTION OF MESH;  Surgeon: Shelly Rubenstein, MD;  Location: [facility] OR;  Service: General;   Laterality: Right;  . Hernia repair    . Inguinal hernia repair Right 11/17/2012    Procedure: REPAIR RIGHT RECURRENT INGUINAL HERNIA;  Surgeon: Shelly Rubenstein, MD;  Location: [facility] OR;  Service: General;  Laterality: Right;  . Insertion of mesh Right 11/17/2012    Procedure: INSERTION OF MESH;  Surgeon: Shelly Rubenstein, MD;  Location: [facility] OR;  Service: General;  Laterality: Right;    Current Outpatient Prescriptions  Medication Sig Dispense Refill  . buPROPion (WELLBUTRIN XL) 150 MG 24 hr tablet Take 450 mg by mouth every morning.       . Calcium Carbonate-Vitamin D (CALTRATE 600+D PO) Take 1 tablet by mouth daily.      . clonazePAM (KLONOPIN) 0.5 MG tablet Take 0.5 mg by mouth at bedtime as needed. For sleep      . estradiol (ESTRACE) 2 MG tablet Take 2 mg by mouth daily.      . flecainide (TAMBOCOR) 50 MG tablet Take 1 tablet (50 mg total) by mouth every 12 (twelve) hours.  60 tablet  11  . fludrocortisone (FLORINEF) 0.1 MG tablet Take 0.3 mg by mouth daily.      . hydrOXYzine (VISTARIL) 25 MG capsule 25 mg daily.       Marland Kitchen levothyroxine (SYNTHROID, LEVOTHROID) 75 MCG tablet Take 75 mcg by mouth daily.      . metoprolol succinate (TOPROL-XL) 25 MG 24 hr tablet Take 1 tablet (25 mg total) by mouth daily.  5 tablet  0  . midodrine (PROAMATINE) 10 MG tablet Take 10 mg by mouth 3 (three) times daily.      Marland Kitchen PARoxetine (PAXIL) 40 MG tablet Take 80 mg by mouth at bedtime.       . Rivaroxaban (XARELTO) 20 MG TABS Take 1 tablet (20 mg total) by mouth daily with supper.  30 tablet  11  . tetrabenazine (XENAZINE) 12.5 MG tablet Take 12.5 mg by mouth 2 (two) times daily.       No current facility-administered medications for this visit.    Allergies as of 01/29/2013 - Review Complete 01/29/2013  Allergen Reaction Noted  . Other Anaphylaxis 01/31/2012  . Ace inhibitors  01/29/2013    Vitals: BP 135/79  Pulse 61  Resp 16  Ht 5\' 7"  (1.702 m)  Wt 167 lb (75.751 kg)  BMI 26.15 kg/m2 Last  Weight:  Wt Readings from Last 1 Encounters:  01/29/13 167 lb (75.751 kg)   Last Height:   Ht Readings from Last 1 Encounters:  01/29/13 5\' 7"  (1.702 m)   Vision Screening:  20/20  Physical exam:  General: The patient is awake, alert and appears not in acute distress. The patient is well groomed. Head: Normocephalic, atraumatic. Neck is supple. Mallampati Cardiovascular:  irregular rate and rhythm, without  murmurs or carotid bruit, and without distended neck veins. Respiratory: Lungs are clear to auscultation. Skin:  Without evidence of edema, or rash Trunk: BMI is normal .  Neurologic exam : The patient is awake and alert, oriented to place and time.  Memory impaired . There is a limited  attention span & concentration ability. Speech is fluent with dysphonia , poverty of speech  -aphasia. Mood and affect are appropriate.  Cranial nerves: Pupils are equal and briskly reactive to light. Funduscopic exam without  evidence of pallor or edema.  Extraocular movements  in vertical and horizontal planes intact , rapid saccades - without nystagmus. Visual fields by finger perimetry are intact. Hearing severely  Limited -.  Facial sensation intact to fine touch. Facial motor strength is symmetric- reduced - masked face ,  and tongue and uvula move midline.  Motor exam:  cogwheeling rigidity over both biceps.  normal muscle bulk .   Sensory:  Fine touch, pinprick and vibration were tested in all extremities. Proprioception is normal.  Coordination: Rapid alternating movements in the fingers/hands is slower on the right -. Finger-to-nose maneuver  with tremor.  Gait and station: Patient walks without assistive device , shuffling  to the exam table.  Tandem gait is impossible  -Steps are fragmented. turning with 6 steps.  Romberg testing documents propulsion.   Deep tendon reflexes: in the  upper and lower extremities are symmetric, brisk  and intact. Babinski maneuver response is   downgoing.   Assessment:  After physical and neurologic examination, review of pre-existing records, assessment  Is that of a right sided dominant trauma, increased cogwheel rigidity, nostril instability. In comparison to previous a goal the patient's gait has become more shuffling she does not provide brought patient at the hip and spine and she needed 6 steps to turn 180.  Plan:  Treatment plan and additional workup will be reviewed under Problem List.  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

## 2013-01-29 NOTE — Patient Instructions (Signed)
Parkinson's Disease Parkinson's disease is a disorder of the brain and spinal cord (central nervous system). The person will slowly lose the ability to control his or her body movements. This happens due to:  Damaged nerve cells.  Low levels of a certain brain chemical. HOME CARE  Exercise often.  Make time to rest during the day.  Take all medicine as told by your doctor.  Replace buttons and zippers with elastic and Velcro if getting dressed is difficult.  Put grab bars or rails in your home. This helps you to not fall.  Go to speech therapy or therapy to help you with daily activities (occupational therapy). Do this as told by your doctor.  Keep all doctor visits as told. GET HELP IF:  Your medicine does not help your symptoms.  You fall.  You have trouble swallowing or choke on your food. MAKE SURE YOU:  Understand these instructions.  Will watch your condition.  Will get help right away if you are not doing well or get worse. Document Released: 09/24/2011 Document Reviewed: 09/24/2011 [facility] Patient Information 2014 [place], [place]. Tremor Tremor is a rhythmic, involuntary muscular contraction characterized by oscillations (to-and-fro movements) of a part of the body. The most common of all involuntary movements, tremor can affect various body parts such as the hands, head, facial structures, vocal cords, trunk, and legs; most tremors, however, occur in the hands. Tremor often accompanies neurological disorders associated with aging. Although the disorder is not life-threatening, it can be responsible for functional disability and social embarrassment. TREATMENT  There are many types of tremor and several ways in which tremor is classified. The most common classification is by behavioral context or position. There are five categories of tremor within this classification: resting, postural, kinetic, task-specific, and psychogenic. Resting or static tremor occurs when  the muscle is at rest, for example when the hands are lying on the lap. This type of tremor is often seen in patients with Parkinson's disease. Postural tremor occurs when a patient attempts to maintain posture, such as holding the hands outstretched. Postural tremors include physiological tremor, essential tremor, tremor with basal ganglia disease (also seen in patients with Parkinson's disease), cerebellar postural tremor, tremor with peripheral neuropathy, post-traumatic tremor, and alcoholic tremor. Kinetic or intention (action) tremor occurs during purposeful movement, for example during finger-to-nose testing. Task-specific tremor appears when performing goal-oriented tasks such as handwriting, speaking, or standing. This group consists of primary writing tremor, vocal tremor, and orthostatic tremor. Psychogenic tremor occurs in both older and younger patients. The key feature of this tremor is that it dramatically lessens or disappears when the patient is distracted. PROGNOSIS There are some treatment options available for tremor; the appropriate treatment depends on accurate diagnosis of the cause. Some tremors respond to treatment of the underlying condition, for example in some cases of psychogenic tremor treating the patient's underlying mental problem may cause the tremor to disappear. Also, patients with tremor due to Parkinson's disease may be treated with Levodopa drug therapy. Symptomatic drug therapy is available for several other tremors as well. For those cases of tremor in which there is no effective drug treatment, physical measures such as teaching the patient to brace the affected limb during the tremor are sometimes useful. Surgical intervention such as thalamotomy or deep brain stimulation may be useful in certain cases. Document Released: 06/22/2002 Document Revised: 09/24/2011 Document Reviewed: 07/02/2005 [facility] Patient Information 2014 [place], [place]. Fall Prevention and Home  Safety Falls cause injuries and can affect all  age groups. It is possible to prevent falls.  HOW TO PREVENT FALLS  Wear shoes with rubber soles that do not have an opening for your toes.  Keep the inside and outside of your house well lit.  Use night lights throughout your home.  Remove clutter from floors.  Clean up floor spills.  Remove throw rugs or fasten them to the floor with carpet tape.  Do not place electrical cords across pathways.  Put grab bars by your tub, shower, and toilet. Do not use towel bars as grab bars.  Put handrails on both sides of the stairway. Fix loose handrails.  Do not climb on stools or stepladders, if possible.  Do not wax your floors.  Repair uneven or unsafe sidewalks, walkways, or stairs.  Keep items you use a lot within reach.  Be aware of pets.  Keep emergency numbers next to the telephone.  Put smoke detectors in your home and near bedrooms. Ask your doctor what other things you can do to prevent falls. Document Released: 04/28/2009 Document Revised: 01/01/2012 Document Reviewed: 10/02/2011 [facility] Patient Information 2014 [place], [place].

## 2013-01-30 ENCOUNTER — Telehealth: Payer: Self-pay | Admitting: Neurology

## 2013-02-03 NOTE — Telephone Encounter (Signed)
Tried to reach [place] yesterday and today. No answer. Left vmail.

## 2013-02-12 ENCOUNTER — Encounter (HOSPITAL_COMMUNITY): Payer: Self-pay | Admitting: General Practice

## 2013-02-12 ENCOUNTER — Inpatient Hospital Stay (HOSPITAL_COMMUNITY)
Admission: AD | Admit: 2013-02-12 | Discharge: 2013-02-15 | DRG: 312 | Disposition: A | Discharge status: Home / Self Care | Payer: Medicare PPO | Source: Ambulatory Visit | Attending: Internal Medicine | Admitting: Internal Medicine

## 2013-02-12 DIAGNOSIS — I951 Orthostatic hypotension: Principal | ICD-10-CM | POA: Diagnosis present

## 2013-02-12 DIAGNOSIS — F329 Major depressive disorder, single episode, unspecified: Secondary | ICD-10-CM | POA: Diagnosis present

## 2013-02-12 DIAGNOSIS — M129 Arthropathy, unspecified: Secondary | ICD-10-CM | POA: Diagnosis present

## 2013-02-12 DIAGNOSIS — F3289 Other specified depressive episodes: Secondary | ICD-10-CM | POA: Diagnosis present

## 2013-02-12 DIAGNOSIS — F411 Generalized anxiety disorder: Secondary | ICD-10-CM | POA: Diagnosis present

## 2013-02-12 DIAGNOSIS — I4891 Unspecified atrial fibrillation: Secondary | ICD-10-CM | POA: Diagnosis present

## 2013-02-12 DIAGNOSIS — G20A1 Parkinson's disease without dyskinesia, without mention of fluctuations: Secondary | ICD-10-CM | POA: Diagnosis present

## 2013-02-12 DIAGNOSIS — G20 Parkinson's disease: Secondary | ICD-10-CM | POA: Diagnosis present

## 2013-02-12 DIAGNOSIS — E039 Hypothyroidism, unspecified: Secondary | ICD-10-CM | POA: Diagnosis present

## 2013-02-12 DIAGNOSIS — E876 Hypokalemia: Secondary | ICD-10-CM | POA: Diagnosis present

## 2013-02-12 DIAGNOSIS — H9190 Unspecified hearing loss, unspecified ear: Secondary | ICD-10-CM | POA: Diagnosis present

## 2013-02-12 LAB — BASIC METABOLIC PANEL
BUN: 17 mg/dL (ref 6–23)
Chloride: 107 mEq/L (ref 96–112)
Creatinine, Ser: 1.12 mg/dL — ABNORMAL HIGH (ref 0.50–1.10)
Glucose, Bld: 106 mg/dL — ABNORMAL HIGH (ref 70–99)
Potassium: 2.9 mEq/L — ABNORMAL LOW (ref 3.5–5.1)

## 2013-02-12 LAB — TSH: TSH: 2.279 u[IU]/mL (ref 0.350–4.500)

## 2013-02-12 LAB — CBC
HCT: 41.7 % (ref 36.0–46.0)
Hemoglobin: 13.9 g/dL (ref 12.0–15.0)
MCHC: 33.3 g/dL (ref 30.0–36.0)
MCV: 96.1 fL (ref 78.0–100.0)
WBC: 7.5 10*3/uL (ref 4.0–10.5)

## 2013-02-12 MED ORDER — PAROXETINE HCL 30 MG PO TABS
60.0000 mg | ORAL_TABLET | Freq: Every morning | ORAL | Status: DC
  Administered 2013-02-13 – 2013-02-15 (×3): 60 mg via ORAL
  Filled 2013-02-12 (×3): qty 2

## 2013-02-12 MED ORDER — HYDROXYZINE PAMOATE 25 MG PO CAPS
25.0000 mg | ORAL_CAPSULE | Freq: Every day | ORAL | Status: DC
  Filled 2013-02-12 (×2): qty 1

## 2013-02-12 MED ORDER — TETRABENAZINE 12.5 MG PO TABS
12.5000 mg | ORAL_TABLET | Freq: Two times a day (BID) | ORAL | Status: DC
  Filled 2013-02-12 (×2): qty 1

## 2013-02-12 MED ORDER — FLUDROCORTISONE ACETATE 0.1 MG PO TABS
0.3000 mg | ORAL_TABLET | Freq: Every day | ORAL | Status: DC
  Administered 2013-02-13 – 2013-02-15 (×3): 0.3 mg via ORAL
  Filled 2013-02-12 (×3): qty 3

## 2013-02-12 MED ORDER — ENOXAPARIN SODIUM 40 MG/0.4ML ~~LOC~~ SOLN: 40.0000 mg | SUBCUTANEOUS | Status: DC

## 2013-02-12 MED ORDER — MIDODRINE HCL 5 MG PO TABS
10.0000 mg | ORAL_TABLET | Freq: Three times a day (TID) | ORAL | Status: DC
  Administered 2013-02-12 – 2013-02-15 (×9): 10 mg via ORAL
  Filled 2013-02-12 (×13): qty 2

## 2013-02-12 MED ORDER — FLECAINIDE ACETATE 100 MG PO TABS
100.0000 mg | ORAL_TABLET | Freq: Two times a day (BID) | ORAL | Status: DC
  Administered 2013-02-12 – 2013-02-15 (×6): 100 mg via ORAL
  Filled 2013-02-12 (×7): qty 1

## 2013-02-12 MED ORDER — ACETAMINOPHEN 650 MG RE SUPP: 650.0000 mg | Freq: Four times a day (QID) | RECTAL | Status: DC | PRN

## 2013-02-12 MED ORDER — CLONAZEPAM 0.5 MG PO TABS: 0.5000 mg | ORAL_TABLET | Freq: Every evening | ORAL | Status: DC | PRN

## 2013-02-12 MED ORDER — ESTRADIOL 2 MG PO TABS
2.0000 mg | ORAL_TABLET | Freq: Every day | ORAL | Status: DC
  Administered 2013-02-13 – 2013-02-15 (×3): 2 mg via ORAL
  Filled 2013-02-12 (×3): qty 1

## 2013-02-12 MED ORDER — BUPROPION HCL ER (XL) 300 MG PO TB24
450.0000 mg | ORAL_TABLET | Freq: Every morning | ORAL | Status: DC
  Administered 2013-02-13 – 2013-02-15 (×3): 450 mg via ORAL
  Filled 2013-02-12 (×3): qty 1

## 2013-02-12 MED ORDER — POTASSIUM CHLORIDE CRYS ER 20 MEQ PO TBCR
40.0000 meq | EXTENDED_RELEASE_TABLET | Freq: Once | ORAL | Status: AC
  Administered 2013-02-12: 40 meq via ORAL
  Filled 2013-02-12: qty 2

## 2013-02-12 MED ORDER — LEVOTHYROXINE SODIUM 75 MCG PO TABS
75.0000 ug | ORAL_TABLET | Freq: Every day | ORAL | Status: DC
  Administered 2013-02-13 – 2013-02-15 (×3): 75 ug via ORAL
  Filled 2013-02-12 (×4): qty 1

## 2013-02-12 MED ORDER — SODIUM CHLORIDE 0.9 % IJ SOLN
3.0000 mL | Freq: Two times a day (BID) | INTRAMUSCULAR | Status: DC
  Administered 2013-02-15: 3 mL via INTRAVENOUS

## 2013-02-12 MED ORDER — SODIUM CHLORIDE 0.9 % IV SOLN
INTRAVENOUS | Status: DC
  Administered 2013-02-13 – 2013-02-14 (×4): via INTRAVENOUS

## 2013-02-12 MED ORDER — RIVAROXABAN 20 MG PO TABS
20.0000 mg | ORAL_TABLET | Freq: Every day | ORAL | Status: DC
  Administered 2013-02-12 – 2013-02-14 (×3): 20 mg via ORAL
  Filled 2013-02-12 (×4): qty 1

## 2013-02-12 MED ORDER — ACETAMINOPHEN 325 MG PO TABS
650.0000 mg | ORAL_TABLET | Freq: Four times a day (QID) | ORAL | Status: DC | PRN
  Administered 2013-02-14 – 2013-02-15 (×3): 650 mg via ORAL
  Filled 2013-02-12 (×3): qty 2

## 2013-02-12 MED ORDER — HYDROXYZINE HCL 25 MG PO TABS
25.0000 mg | ORAL_TABLET | Freq: Every day | ORAL | Status: DC
  Administered 2013-02-12 – 2013-02-14 (×3): 25 mg via ORAL
  Filled 2013-02-12 (×3): qty 1

## 2013-02-12 NOTE — H&P (Addendum)
[facility] History and Physical  Melinda [name] ZOX:[id number] DOB: 1940-10-29 DOA: 02/12/2013  Referring physician: Dr Valentina Lucks PCP: Lillia Mountain, MD   Chief Complaint:  Weakness with orthostatic hypotension   HPI:  72 y/o female with hx of A. fib on the metoprolol and xarelto , depression, hypothyroidism, newly diagnosed Parkinson's disease and started on Sinemet, chronic orthostatic hypotension who presented to her PCP office feeling weak and unsteady for past  3-4 days. She reports that since she was started on Sinemet she has been feeling weak but for last few days she has been quite unsteady in her feet, feeling dizzy at times and had nausea as well. Patient denies any headache, blurred vision, chest pain, palpitation, vomiting, abdominal pain, bowel or urinary symptoms. Denies any fall or passing out.  In the PCP office patient was noted to be orthostatic with systolic blood pressure as low as in her 70s. [facility] called for admission to medical floor under observation  Review of Systems:  Constitutional: Denies fever, chills, diaphoresis, appetite change, reports fatigue, reports poor by mouth intake HEENT: Denies photophobia, eye pain, redness, hearing loss, ear pain, congestion, sore throat, rhinorrhea, sneezing, mouth sores, trouble swallowing, neck pain, neck stiffness and tinnitus.   Respiratory: Denies SOB, DOE, cough, chest tightness,  and wheezing.   Cardiovascular: Denies chest pain, palpitations and leg swelling.  Gastrointestinal: Reports nausea, denies vomiting, abdominal pain, diarrhea, constipation, blood in stool and abdominal distention.  Genitourinary: Denies dysuria, urgency, frequency, hematuria, flank pain and difficulty urinating.  Endocrine: Denies: hot or cold intolerance,  , polyuria, polydipsia. Musculoskeletal: Denies myalgias, back pain, joint swelling, arthralgias . Has unsteady gait Skin: Denies pallor, rash and wound.   Neurological: Has dizziness, and weakness, denies seizures, syncope, , light-headedness, numbness and headaches.  Psychiatric/Behavioral: Denies  mood changes, confusion, nervousness, sleep disturbance and agitation   Past Medical History  Diagnosis Date  . Depression   . Anxiety   . Thyroid disease   . Hearing loss   . PONV (postoperative nausea and vomiting)   . Arthritis   . Hypothyroidism   . Tremors of nervous system   . Dysrhythmia     brief rapid afib 01/2012;   john griffin  md  . Orthostatic hypotension   . Sinusitis   . Cancer     uterus removed in 1974  . 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 Back surgery    . Abdominal hysterectomy    . Breast enhancement surgery    . Hammer toe surgery    . Inguinal hernia repair  06/27/2012    Procedure: HERNIA REPAIR INGUINAL ADULT;  Surgeon: Shelly Rubenstein, MD;  Location: [facility] OR;  Service: General;  Laterality: Right;  . Insertion of mesh  06/27/2012    Procedure: INSERTION OF MESH;  Surgeon: Shelly Rubenstein, MD;  Location: [facility] OR;  Service: General;  Laterality: Right;  . Hernia repair    . Inguinal hernia repair Right 11/17/2012    Procedure: REPAIR RIGHT RECURRENT INGUINAL HERNIA;  Surgeon: Shelly Rubenstein, MD;  Location: [facility] OR;  Service: General;  Laterality: Right;  . Insertion of mesh Right 11/17/2012    Procedure: INSERTION OF MESH;  Surgeon: Shelly Rubenstein, MD;  Location: [facility] OR;  Service: General;  Laterality: Right;   Social History:  reports that she quit smoking about 20 years ago. Her smoking use included Cigarettes. She has a 10 pack-year  smoking history. She has never used smokeless tobacco. She reports that she does not drink alcohol or use illicit drugs.  Allergies  Allergen Reactions  . 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 Ace Inhibitors     Family History  Problem Relation Age of Onset  . Migraines Mother   .  Anxiety disorder Son   . Migraines Daughter   . Anxiety disorder Daughter     Prior to Admission medications   Medication Sig Start Date End Date Taking? Authorizing Provider  buPROPion (WELLBUTRIN XL) 150 MG 24 hr tablet Take 450 mg by mouth every morning.    Yes Historical Provider, MD  Calcium Carbonate-Vitamin D (CALTRATE 600+D PO) Take 1 tablet by mouth daily.   Yes Historical Provider, MD  carbidopa-levodopa (SINEMET) 25-100 MG per tablet Take 1 tablet by mouth 3 (three) times daily. 01/29/13  Yes Carmen Dohmeier, MD  clonazePAM (KLONOPIN) 0.5 MG tablet Take 0.5 mg by mouth at bedtime as needed. For sleep   Yes Historical Provider, MD  estradiol (ESTRACE) 2 MG tablet Take 2 mg by mouth daily.   Yes Historical Provider, MD  flecainide (TAMBOCOR) 100 MG tablet Take 100 mg by mouth 2 (two) times daily.   Yes Historical Provider, MD  fludrocortisone (FLORINEF) 0.1 MG tablet Take 0.3 mg by mouth daily.   Yes Historical Provider, MD  hydrOXYzine (VISTARIL) 25 MG capsule Take 25 mg by mouth at bedtime.  11/14/12  Yes Historical Provider, MD  levothyroxine (SYNTHROID, LEVOTHROID) 75 MCG tablet Take 75 mcg by mouth daily.   Yes Historical Provider, MD  metoprolol succinate (TOPROL-XL) 25 MG 24 hr tablet Take 1 tablet (25 mg total) by mouth daily. 07/22/12  Yes Joya Gaskins, MD  midodrine (PROAMATINE) 10 MG tablet Take 10 mg by mouth 3 (three) times daily.   Yes Historical Provider, MD  PARoxetine (PAXIL) 30 MG tablet Take 60 mg by mouth every morning.   Yes Historical Provider, MD  Rivaroxaban (XARELTO) 20 MG TABS Take 1 tablet (20 mg total) by mouth daily with supper. 07/24/12  Yes Lillia Mountain, MD  tetrabenazine Guinevere Scarlet) 12.5 MG tablet Take 12.5 mg by mouth 2 (two) times daily.   Yes Historical Provider, MD    Physical Exam:  Filed Vitals:   02/12/13 1348  BP: 95/68  Pulse: 58  Temp: 98.5 F (36.9 C)  TempSrc: Axillary  Resp: 16  Height: 5\' 7"  (1.702 m)  Weight: 74.6 kg (164  lb 7.4 oz)    Constitutional: Vital signs reviewed. Elderly thin built female in no acute distress HEENT: No pallor, moist oral mucosa Chest: Clear to auscultation bilaterally, no added sounds CVS: S1 and S2 irregular, no murmurs rub or gallop Abdomen: Soft, nontender, nondistended, bowel sounds present Extremities: Warm, no edema CNS: AAO x3, flat affect, resting tremors in her hand, normal motor tone power and reflexes, normal sensations. Gait not assessed   Labs on Admission:  NA  EKG: Pending  Assessment/Plan  Orthostatic hypotension Patient has chronic orthostatic hypotension. She was recently started on Sinemet for Parkinson-like symptoms by neurologist. She presented to her PCP office with unsteadiness and was found to be severely orthostatic. She'll be admitted under observation on telemetry. I will start her on IV hydration with normal saline. Recheck orthostasis. Continue Florinef and midodrine. As per PCP she has not been taking the medications as instructed. Will order physical therapy.   Active Problems: Depression Continue home medication  Hypothyroidism Continue Synthroid.  Check TSH  Atrial fibrillation Rate controlled. Check 12-lead EKG. Monitor on telemetry  Parkinson's disease Newly diagnosed and started on Sinemet recently. She reports feeling more unsteady after being started on it. I would hold the Sinemet for now given it can cause hypotension as common side effect although Orthostatic hypotension probably is related to chronic symptoms.    DVT prophylaxis: Subcutaneous Lovenox Diet: Regular      Code Status: Full code Family Communication: None at bedside Disposition Plan: Pending PT evaluation  Eddie North [facility] Pager [phone] If 7PM-7AM, please contact night-coverage [email or web address] Password [email or web address] 02/12/2013, 2:21 PM    Total time spent: 50 minutes  Dr. Valentina Lucks will  resume patient's care from 8/1

## 2013-02-13 LAB — BASIC METABOLIC PANEL
Calcium: 9.3 mg/dL (ref 8.4–10.5)
GFR calc non Af Amer: 62 mL/min — ABNORMAL LOW (ref >90)
Glucose, Bld: 91 mg/dL (ref 70–99)
Sodium: 145 mEq/L (ref 135–145)

## 2013-02-13 MED ORDER — POTASSIUM CHLORIDE 20 MEQ/15ML (10%) PO LIQD
20.0000 meq | Freq: Every day | ORAL | Status: DC
  Administered 2013-02-13 – 2013-02-15 (×3): 20 meq via ORAL
  Filled 2013-02-13 (×3): qty 15

## 2013-02-13 NOTE — Care Management Note (Signed)
  Page 2 of 2   02/13/2013     3:13:16 PM   CARE MANAGEMENT NOTE 02/13/2013  Patient:  Melinda [name], Melinda [name]   Account Number:  [id number]  Date Initiated:  02/13/2013  Documentation initiated by:  Ronny Flurry  Subjective/Objective Assessment:     Action/Plan:   Anticipated DC Date:     Anticipated DC Plan:  HOME W HOME HEALTH SERVICES         Choice offered to / List presented to:     DME arranged  3-N-1      DME agency  [facility]     HH arranged  HH-1 RN  HH-2 PT  HH-3 OT  HH-4 NURSE'S AIDE      HH agency  [facility]   Status of service:   Medicare Important Message given?   (If response is "NO", the following Medicare IM given date fields will be blank) Date Medicare IM given:   Date Additional Medicare IM given:    Discharge Disposition:    Per UR Regulation:    If discussed at Long Length of Stay Meetings, dates discussed:    Comments:  Daughters Laray Anger  02-13-13 Anibal Henderson called back . Patient has [employer] PPO plan , [facility] not in network with PPO plans . Suggested calling Caleen Essex with [facility] for other options .   [employer] [phone]  Transitions 1 [id number]  Discussed with daughter Virl Diamond RN BSN [phone]   02-13-13 Spoke with patient and daughter at bedside . Confirmed facesheet information with daughter . Provided private duty list and home health agency list to patient and daughter .  Referral for [facility] called and emailed to [employer] .    Ronny Flurry RN BSN [phone]

## 2013-02-13 NOTE — Progress Notes (Signed)
Occupational Therapy Evaluation Patient Details Name: Melinda [name] MRN: [id number] DOB: May 16, 1941 Today's Date: 02/13/2013 Time: 4696-2952 OT Time Calculation (min): 19 min  OT Assessment / Plan / Recommendation History of present illness Pt adm with orthostasis and unsteady gait (worse for several days). Recently started on Sinemet for Parkinson's (which MD has now stopped due to one side effect is orthostasis). PMHx includes depression, Parkinsonism, afib, and chronic orthostasis.   Clinical Impression   PTA, pt lived home alone and was indepedent with ADL and mobility. Pt states she has "spells" @ 3x/wk where she passes out and that this has been worsening. Feel pt would benefit from rehab at SNF to facilitate safe return home given hx with orthostasis, as daughters can not provide any assistance. Feel SNF would allow for close monitoring or BP, Medicine adjustments and overall strengthening to reduce readmission. Pt will benefit from skilled OT services to facilitate D/C to next venue due to below deficits.Pt was NOT orthostatic during eval. Discussed SNF with pt, who would like to discuss it with SW.    OT Assessment  Patient needs continued OT Services    Follow Up Recommendations  SNF . Pt may want to consider ALF   Barriers to Discharge Decreased caregiver support    Equipment Recommendations  3 in 1 bedside comode;Tub/shower seat    Recommendations for Other Services  SW for discussion  Regarding SNF  Frequency  Min 2X/week    Precautions / Restrictions Precautions Precautions: Fall Precaution Comments: RN reports pt was getting up to [facility] on her own this a.m. Reiterated fall risk and need to call for assist with pt and daughter Restrictions Weight Bearing Restrictions: No   Pertinent Vitals/Pain Orthostatic BPs  Supine 123/72  Sitting 125/68  Sitting after  min   Standing   Standing after 2 min 125/68      ADL  Eating/Feeding: Modified independent Grooming:  Set up Where Assessed - Grooming: Unsupported sitting Upper Body Bathing: Set up Where Assessed - Upper Body Bathing: Unsupported sitting Lower Body Bathing: Supervision/safety;Set up Where Assessed - Lower Body Bathing: Unsupported sit to stand Upper Body Dressing: Set up Where Assessed - Upper Body Dressing: Unsupported sitting Lower Body Dressing: Set up;Supervision/safety Where Assessed - Lower Body Dressing: Unsupported sit to stand Toilet Transfer: Supervision/safety Toilet Transfer Method: Sit to stand Transfers/Ambulation Related to ADLs: minguard ADL Comments: overall S level    OT Diagnosis: Generalized weakness  OT Problem List: Decreased activity tolerance;Decreased safety awareness;Decreased knowledge of use of DME or AE;Decreased knowledge of precautions;Cardiopulmonary status limiting activity OT Treatment Interventions: Self-care/ADL training;Therapeutic exercise;Energy conservation;DME and/or AE instruction;Therapeutic activities;Patient/family education   OT Goals(Current goals can be found in the care plan section) Acute Rehab OT Goals Patient Stated Goal: wants to return home alone OT Goal Formulation: With patient Time For Goal Achievement: 02/27/13 Potential to Achieve Goals: Good  Visit Information  Last OT Received On: 02/13/13 Assistance Needed: +1 History of Present Illness: Pt adm with orthostasis and unsteady gait (worse for several days). Recently started on Sinemet for Parkinson's (which MD has now stopped due to one side effect is orthostasis). PMHx includes depression, Parkinsonism, afib, and chronic orthostasis.       Prior Functioning     Home Living Family/patient expects to be discharged to:: Private residence Living Arrangements: Alone Type of Home: House Home Access: Stairs to enter [employer] of Steps: 2 Entrance Stairs-Rails: None Home Layout: One level Home Equipment: Walker - 2 wheels Prior Function Level  of  Independence: Independent Comments: h/o falls; although admits only near falls recently Communication Communication: HOH         Vision/Perception     Cognition  Cognition Arousal/Alertness: Awake/alert Behavior During Therapy: Flat affect Overall Cognitive Status: Impaired/Different from baseline Area of Impairment: Safety/judgement;Awareness Safety/Judgement: Decreased awareness of safety;Decreased awareness of deficits Awareness: Emergent General Comments: ? baseline    Extremity/Trunk Assessment Upper Extremity Assessment Upper Extremity Assessment: Generalized weakness Cervical / Trunk Assessment Cervical / Trunk Assessment: Kyphotic     Mobility Bed Mobility Supine to Sit: HOB elevated;6: Modified independent (Device/Increase time) Sitting - Scoot to Edge of Bed: 5: Supervision;6: Modified independent (Device/Increase time) Details for Bed Mobility Assistance: mod I Transfers Sit to Stand: 4: Min guard Stand to Sit: 4: Min guard     Exercise General Exercises - Lower Extremity Ankle Circles/Pumps:  (20reps each time sitting) Long Arc Quad:  (20 reps each time seated) Hip Flexion/Marching:  (20 reps each time seated; standing) Mini-Sqauts:  (x 2)   Balance Balance Balance Assessed: Yes Static Sitting Balance Static Sitting - Balance Support: No upper extremity supported;Feet supported Static Sitting - Level of Assistance: 7: Independent Static Standing Balance Static Standing - Balance Support: Bilateral upper extremity supported Static Standing - Level of Assistance: 5: Stand by assistance   End of Session OT - End of Session Equipment Utilized During Treatment: Gait belt Activity Tolerance: Patient tolerated treatment well Patient left: in bed;with call bell/phone within reach;with bed alarm set Nurse Communication: Mobility status  GO     Lelaina Oatis,HILLARY 02/13/2013, 2:16 PM [facility], OTR/L  [phone] 02/13/2013

## 2013-02-13 NOTE — Evaluation (Addendum)
Physical Therapy Evaluation Patient Details Name: Melinda [name] MRN: [id number] DOB: 03-Aug-1940 Today's Date: 02/13/2013 Time: 4098-1191 PT Time Calculation (min): 46 min  PT Assessment / Plan / Recommendation History of Present Illness  Pt adm with orthostasis and unsteady gait (worse for several days). Recently started on Sinemet for Parkinson's (which MD has now stopped due to one side effect is orthostasis). PMHx includes depression, Parkinsonism, afib, and chronic orthostasis.   Clinical Impression  Pt admitted with orthostasis and unsteady gait. Full gait assessment could not be completed due to +orthostasis when up. Pt currently with functional limitations due to the deficits listed below (see PT Problem List). Pt lives alone and has had h/o falls and recent near falls. Pt will benefit from skilled PT to increase their independence and safety with mobility to allow discharge to the venue listed below. Daughters cannot provide 24 hr supervision/assist per our discussion. Progress will be dependent on orthostasis control. Pt may benefit from knee high compression stockings (if she can don them) and abdominal binder.      PT Assessment  Patient needs continued PT services    Follow Up Recommendations  SNF;Supervision for mobility/OOB (to be further assessed as orthostasis controlled)    Does the patient have the potential to tolerate intense rehabilitation      Barriers to Discharge Decreased caregiver support dtr arrived at end of session and reported neither she nor her sister could care for pt    Equipment Recommendations  3in1 (PT)    Recommendations for Other Services OT consult   Frequency Min 3X/week    Precautions / Restrictions Precautions Precautions: Fall Precaution Comments: RN reports pt was getting up to [facility] on her own this a.m. Reiterated fall risk and need to call for assist with pt and daughter   Pertinent Vitals/Pain See vitals flow sheet.        Mobility  Bed Mobility Bed Mobility: Supine to Sit;Sitting - Scoot to Edge of Bed Supine to Sit: 5: Supervision;HOB elevated Sitting - Scoot to Edge of Bed: 5: Supervision Details for Bed Mobility Assistance: supervision for safety 2/2 lines, dizziness Transfers Transfers: Sit to Stand;Stand to Sit Sit to Stand: 4: Min guard Stand to Sit: 4: Min guard Details for Transfer Assistance: x 2;  Ambulation/Gait Ambulation/Gait Assistance: 4: Min assist Ambulation Distance (Feet): 3 Feet Assistive device: Rolling walker Ambulation/Gait Assistance Details: limited by dizziness and orthostasis; to chair only Gait Pattern: Shuffle;Trunk flexed    Exercises General Exercises - Lower Extremity Ankle Circles/Pumps: AROM;Both;Other reps (comment);Seated (20reps each time sitting) Long Arc Quad: AROM;Both;Other reps (comment);Seated (20 reps each time seated) Hip Flexion/Marching: AROM;Both;Other reps (comment);Seated;Standing (20 reps each time seated; standing) Toe Raises: AROM;Both;10 reps;Standing Mini-Sqauts: AROM;Both;20 reps;Standing (x 2)   PT Diagnosis: Difficulty walking  PT Problem List: Decreased activity tolerance;Decreased balance;Decreased mobility;Decreased cognition;Decreased knowledge of use of DME;Decreased safety awareness;Decreased knowledge of precautions PT Treatment Interventions: DME instruction;Gait training;Stair training;Functional mobility training;Therapeutic activities;Therapeutic exercise;Cognitive remediation;Patient/family education     PT Goals(Current goals can be found in the care plan section) Acute Rehab PT Goals Patient Stated Goal: wants to return home alone PT Goal Formulation: With patient Time For Goal Achievement: 02/20/13 Potential to Achieve Goals: Fair (depending on orthostasis)  Visit Information  Last PT Received On: 02/13/13 Assistance Needed: +1 History of Present Illness: Pt adm with orthostasis and unsteady gait (worse for  several days). Recently started on Sinemet for Parkinson's (which MD has now stopped due to one side effect is orthostasis). PMHx  includes depression, Parkinsonism, afib, and chronic orthostasis.       Prior Functioning  Home Living Family/patient expects to be discharged to:: Private residence Living Arrangements: Alone Type of Home: House Home Access: Stairs to enter Secretary/administrator of Steps: 2 Entrance Stairs-Rails: None Home Layout: One level Home Equipment: Environmental consultant - 2 wheels Prior Function Level of Independence: Independent Comments: h/o falls; although admits only near falls recently Communication Communication: HOH    Cognition  Cognition Arousal/Alertness: Awake/alert Behavior During Therapy: Flat affect Overall Cognitive Status: Impaired/Different from baseline Area of Impairment: Safety/judgement;Awareness Safety/Judgement: Decreased awareness of safety;Decreased awareness of deficits    Extremity/Trunk Assessment Upper Extremity Assessment Upper Extremity Assessment: Defer to OT evaluation Lower Extremity Assessment Lower Extremity Assessment: Generalized weakness Cervical / Trunk Assessment Cervical / Trunk Assessment: Kyphotic   Balance Balance Balance Assessed: Yes Static Sitting Balance Static Sitting - Balance Support: No upper extremity supported;Feet supported Static Sitting - Level of Assistance: 5: Stand by assistance Static Standing Balance Static Standing - Balance Support: Bilateral upper extremity supported Static Standing - Level of Assistance: 4: Min assist  End of Session PT - End of Session Equipment Utilized During Treatment: Gait belt Activity Tolerance: Treatment limited secondary to medical complications (Comment) Patient left: in chair;with call bell/phone within reach;with family/visitor present Nurse Communication: Mobility status;Other (comment) (?need for chair alarm if dtr not present)  GP Functional Assessment Tool Used:  clinical judgement Functional Limitation: Mobility: Walking and moving around Mobility: Walking and Moving Around Current Status (J1914): At least 80 percent but less than 100 percent impaired, limited or restricted Mobility: Walking and Moving Around Goal Status [phone]): At least 1 percent but less than 20 percent impaired, limited or restricted   Molleigh Huot 02/13/2013, 9:46 AM Pager [phone]

## 2013-02-13 NOTE — Progress Notes (Signed)
Nutrition Consult/Brief Note  RD consulted for poor PO intake.  Patient reports she's eating well; had lunch.  RD offered nutrition supplements, however, patient declined.  Body mass index is 25.75 kg/(m^2). Patient meets criteria for Overweight based on current BMI.   Current diet order is Regular, patient is consuming an average of 75% of her meals at this time. Labs and medications reviewed.   No nutrition interventions at this time. Please re-consult as needed.  Maureen Chatters, RD, LDN Pager #: [phone] After-Hours Pager #: [phone]

## 2013-02-13 NOTE — Progress Notes (Signed)
Nutrition Brief Note  Patient identified on the Malnutrition Screening Tool (MST) Report for recent weight loss without trying.  Per readings below, patient has had a 5% weight loss since April 2014; not significant for time frame.  Wt Readings from Last 10 Encounters:  02/12/13 164 lb 7.4 oz (74.6 kg)  01/29/13 167 lb (75.751 kg)  12/10/12 170 lb 9.6 oz (77.384 kg)  11/11/12 170 lb (77.111 kg)  10/20/12 173 lb 3.2 oz (78.563 kg)  07/24/12 173 lb (78.472 kg)  07/22/12 169 lb 9.6 oz (76.93 kg)  06/18/12 171 lb 11.2 oz (77.883 kg)  06/03/12 169 lb 9.6 oz (76.93 kg)  02/02/12 172 lb 13.5 oz (78.4 kg)    Body mass index is 25.75 kg/(m^2). Patient meets criteria for Overweight based on current BMI.   Current diet order is Regular, patient is consuming approximately 75-100% of meals at this time. Labs and medications reviewed.   No nutrition interventions warranted at this time. If nutrition issues arise, please consult RD.   Maureen Chatters, RD, LDN Pager #: [phone] After-Hours Pager #: [phone]

## 2013-02-13 NOTE — Progress Notes (Signed)
Subjective: No complaints  Objective: Vital signs in last 24 hours: Temp:  [97.4 F (36.3 C)-98.5 F (36.9 C)] 97.5 F (36.4 C) (08/01 0618) Pulse Rate:  [53-59] 59 (08/01 0618) Resp:  [16-18] 18 (08/01 0618) BP: (95-152)/(59-86) 152/86 mmHg (08/01 0618) SpO2:  [95 %-98 %] 98 % (08/01 0618) Weight:  [74.6 kg (164 lb 7.4 oz)] 74.6 kg (164 lb 7.4 oz) (07/31 1348) Weight change:  Last BM Date: 02/11/13  Intake/Output from previous day: 07/31 0701 - 08/01 0700 In: 360 [P.O.:360] Out: -  Intake/Output this shift:    General appearance: alert and cooperative Resp: clear to auscultation bilaterally Cardio: regular rate and rhythm, S1, S2 normal, no murmur, click, rub or gallop Extremities: extremities normal, atraumatic, no cyanosis or edema  Lab Results:  Recent Labs  02/12/13 1533  WBC 7.5  HGB 13.9  HCT 41.7  PLT 215   BMET  Recent Labs  02/12/13 1533  NA 144  K 2.9*  CL 107  CO2 25  GLUCOSE 106*  BUN 17  CREATININE 1.12*  CALCIUM 9.5    Studies/Results: No results found.  Medications: I have reviewed the patient's current medications.  Assessment/Plan: Principal Problem:   Orthostatic hypotension  Continue IVFs, fludrocortisone and midodrine.  Sinemet on hold. PT to see. Active Problems:   DEPRESSION on paxil   Hypothyroidism   Atrial fibrillation in NSR on flecainide   Parkinsonism, followed by neurology. Sinemet on hold.   Hypokalemia,  Replete. D/C on KCL 20 meq a day.   LOS: 1 day   [facility]   [phone] 02/13/2013, 7:48 AM

## 2013-02-14 LAB — BASIC METABOLIC PANEL
BUN: 10 mg/dL (ref 6–23)
Calcium: 8.8 mg/dL (ref 8.4–10.5)
GFR calc Af Amer: 80 mL/min — ABNORMAL LOW (ref >90)
GFR calc non Af Amer: 69 mL/min — ABNORMAL LOW (ref >90)
Potassium: 4 mEq/L (ref 3.5–5.1)

## 2013-02-14 NOTE — Progress Notes (Signed)
Subjective: Patient states she feels better, she walked the hall without feeling dizzy. She does have IV fluids run at 125 cc an hour. The patient apparently does not have assistance at home, her [place]. Physical therapy and occupational therapy have recommended skilled nursing facility. The daughter became very tearful when we discussed disposition, she states she cannot care for her mother at home. At this time patient has a history of chronic orthostasis, require medication, she's stable IV fluids, she may not be safe for discharge home.  Objective: Vital signs in last 24 hours: Temp:  [97.4 F (36.3 C)-98.2 F (36.8 C)] 98.2 F (36.8 C) (08/02 0600) Pulse Rate:  [62-105] 105 (08/02 0600) Resp:  [18-22] 18 (08/02 0600) BP: (125-158)/(68-86) 158/86 mmHg (08/02 0600) SpO2:  [97 %-98 %] 97 % (08/02 0600) Weight change:  Last BM Date: 02/13/13  Intake/Output from previous day: 08/01 0701 - 08/02 0700 In: 5805.8 [P.O.:240; I.V.:5565.8] Out: -  Intake/Output this shift:    General appearance: Patient has a very nervous and scared appearance Resp: clear to auscultation bilaterally Cardio: regular rate and rhythm, S1, S2 normal, no murmur, click, rub or gallop  Lab Results:  Results for orders placed during the hospital encounter of 02/12/13 (from the past 24 hour(s))  BASIC METABOLIC PANEL     Status: Abnormal   Collection Time    02/14/13  6:05 AM      Result Value Range   Sodium 145  135 - 145 mEq/L   Potassium 4.0  3.5 - 5.1 mEq/L   Chloride 112  96 - 112 mEq/L   CO2 20  19 - 32 mEq/L   Glucose, Bld 89  70 - 99 mg/dL   BUN 10  6 - 23 mg/dL   Creatinine, Ser 8.46  0.50 - 1.10 mg/dL   Calcium 8.8  8.4 - 96.2 mg/dL   GFR calc non Af Amer 69 (*) >90 mL/min   GFR calc Af Amer 80 (*) >90 mL/min      Studies/Results: No results found.  Medications:  Prior to Admission:  Prescriptions prior to admission  Medication Sig Dispense Refill  . buPROPion  (WELLBUTRIN XL) 150 MG 24 hr tablet Take 450 mg by mouth every morning.       . Calcium Carbonate-Vitamin D (CALTRATE 600+D PO) Take 1 tablet by mouth daily.      . carbidopa-levodopa (SINEMET) 25-100 MG per tablet Take 1 tablet by mouth 3 (three) times daily.  90 tablet  3  . clonazePAM (KLONOPIN) 0.5 MG tablet Take 0.5 mg by mouth at bedtime as needed. For sleep      . estradiol (ESTRACE) 2 MG tablet Take 2 mg by mouth daily.      . flecainide (TAMBOCOR) 100 MG tablet Take 100 mg by mouth 2 (two) times daily.      . fludrocortisone (FLORINEF) 0.1 MG tablet Take 0.3 mg by mouth daily.      . hydrOXYzine (VISTARIL) 25 MG capsule Take 25 mg by mouth at bedtime.       Marland Kitchen levothyroxine (SYNTHROID, LEVOTHROID) 75 MCG tablet Take 75 mcg by mouth daily.      . metoprolol succinate (TOPROL-XL) 25 MG 24 hr tablet Take 1 tablet (25 mg total) by mouth daily.  5 tablet  0  . midodrine (PROAMATINE) 10 MG tablet Take 10 mg by mouth 3 (three) times daily.      Marland Kitchen PARoxetine (PAXIL) 30 MG tablet Take 60 mg by mouth  every morning.      . Rivaroxaban (XARELTO) 20 MG TABS Take 1 tablet (20 mg total) by mouth daily with supper.  30 tablet  11  . tetrabenazine (XENAZINE) 12.5 MG tablet Take 12.5 mg by mouth 2 (two) times daily.       Scheduled: . buPROPion  450 mg Oral q morning - 10a  . estradiol  2 mg Oral Daily  . flecainide  100 mg Oral BID  . fludrocortisone  0.3 mg Oral Daily  . hydrOXYzine  25 mg Oral QHS  . levothyroxine  75 mcg Oral QAC breakfast  . midodrine  10 mg Oral TID WC  . PARoxetine  60 mg Oral q morning - 10a  . potassium chloride  20 mEq Oral Daily  . Rivaroxaban  20 mg Oral Q supper  . sodium chloride  3 mL Intravenous Q12H   Continuous: . sodium chloride 125 mL/hr at 02/14/13 0454    Assessment/Plan: Recurrent orthostatic hypotension treated with fludrocortisone and midodrine, still requiring IV fluids Hypothyroidism Paroxysmal A. fib Parkinson, Sinemet on  hold Hypokalemia  Disposition patient may warrant skilled nursing facility ideally should be watched without IV fluids to see if orthostasis returns prior to discharge home if that is the choice.   LOS: 2 days   Mimie Goering D 02/14/2013, 11:44 AM

## 2013-02-14 NOTE — Progress Notes (Addendum)
Physical Therapy Treatment Patient Details Name: Melinda [name] MRN: [id number] DOB: [birth year] Today's Date: 02/14/2013 Time: 3086-5784 PT Time Calculation (min): 16 min  PT Assessment / Plan / Recommendation  History of Present Illness Pt adm with orthostasis and unsteady gait (worse for several days). Recently started on Sinemet for Parkinson's (which MD has now stopped due to one side effect is orthostasis). PMHx includes depression, Parkinsonism, afib, and chronic orthostasis.   PT Comments   Pt with improved mobility as BP has improved however with pt's cognitive deficits am not sure she will be able to respond appropriately on her own at home if she becomes orthostatic.  Continue to feel SNF will be appropriate.  Follow Up Recommendations  SNF     Does the patient have the potential to tolerate intense rehabilitation     Barriers to Discharge        Equipment Recommendations  3in1 (PT)    Recommendations for Other Services    Frequency Min 3X/week   Progress towards PT Goals Progress towards PT goals: Progressing toward goals  Plan Current plan remains appropriate    Precautions / Restrictions Precautions Precautions: Fall   Pertinent Vitals/Pain See flow sheet.    Mobility  Bed Mobility Supine to Sit: HOB elevated;6: Modified independent (Device/Increase time) Sitting - Scoot to Edge of Bed: 5: Supervision;6: Modified independent (Device/Increase time) Transfers Sit to Stand: 4: Min guard;With upper extremity assist;From bed Stand to Sit: 4: Min guard;With upper extremity assist;With armrests;To chair/3-in-1 Ambulation/Gait Ambulation/Gait Assistance: 4: Min guard Ambulation Distance (Feet): 150 Feet Assistive device: Rolling walker Ambulation/Gait Assistance Details: Pt reports some slight light headedness Gait Pattern: Step-through pattern;Decreased stride length Gait velocity: decr    Exercises General Exercises - Lower Extremity Ankle Circles/Pumps:  AROM;Both;10 reps;Supine   PT Diagnosis:    PT Problem List:   PT Treatment Interventions:     PT Goals (current goals can now be found in the care plan section)    Visit Information  Last PT Received On: 02/14/13 Assistance Needed: +1 History of Present Illness: Pt adm with orthostasis and unsteady gait (worse for several days). Recently started on Sinemet for Parkinson's (which MD has now stopped due to one side effect is orthostasis). PMHx includes depression, Parkinsonism, afib, and chronic orthostasis.    Subjective Data      Cognition  Cognition Arousal/Alertness: Awake/alert Behavior During Therapy: Flat affect Area of Impairment: Safety/judgement;Awareness Memory: Decreased short-term memory Safety/Judgement: Decreased awareness of safety;Decreased awareness of deficits General Comments: ? baseline    Balance  Static Standing Balance Static Standing - Balance Support: Bilateral upper extremity supported Static Standing - Level of Assistance: 5: Stand by assistance  End of Session PT - End of Session Equipment Utilized During Treatment: Gait belt Activity Tolerance: Patient tolerated treatment well Patient left: in chair;with call bell/phone within reach Nurse Communication: Mobility status   GP     Arihana Ambrocio 02/14/2013, 1:48 PM  [employer] PT [phone]

## 2013-02-15 MED ORDER — POTASSIUM CHLORIDE 20 MEQ/15ML (10%) PO LIQD: 20.0000 meq | Freq: Every day | ORAL | Status: DC

## 2013-02-15 MED ORDER — METOPROLOL SUCCINATE ER 25 MG PO TB24: 25.0000 mg | ORAL_TABLET | Freq: Every day | ORAL | Status: DC

## 2013-02-15 NOTE — Progress Notes (Signed)
Home care instructions gone over. Home medications gone over. Follow up appointment to be made. [facility] will be seeing patient. 3 in one chair already at patient's home. Both daughters are present at discharge instructions. Patient and family have verbalized understanding of instructions.

## 2013-02-15 NOTE — Discharge Summary (Addendum)
Physician Discharge Summary  Patient ID: Melinda [name] MRN: [id number] DOB/AGE: Mar 10, 1941 72 y.o.  Admit date: 02/12/2013 Discharge date: 02/15/2013  Admission Diagnoses:  Discharge Diagnoses:  Principal Problem:   Orthostatic hypotension Active Problems:   DEPRESSION   Hypothyroidism   Atrial fibrillation   Parkinson disease   Discharged Condition: stable  Hospital Course:  Patient was admitted to the hospital with chief complaint of weakness and orthostatic hypotension. She has a chronic history of orthostatic hypotension she also has newly diagnosed Parkinson's disease. Recently started on Sinemet which was felt to aggravate her underlying orthostasis. Patient was hospitalized, provide with IV fluids. Medications were orthostasis were resumed. There was concern that patient had not been taken medications at home as prescribed. Patient was found to have low potassium which was replaced orally. She was seen by physical therapy occupational therapy. There was recommendation for skilled nursing facility however patient wanted to go home, her daughters plan to assist at home with home services. With IV fluids and compliance with medications patient's BP has remained stable, she's ambulating without dizziness there have been no falls in the hospital. I have spoken with her daughters numerous visits, Saturday and again today about what is needed. Patient will maintain good oral intake, compliance with medications, support stockings. She will hold her BP medication until seen on followup by her primary M.D. she does have a history of atrial fibrillation, she's currently in normal sinus rhythm Consults:    Significant Diagnostic Studies:No results found.    Discharge Exam: Blood pressure 160/95, pulse 64, temperature 97.6 F (36.4 C), temperature source Oral, resp. rate 18, height 5\' 7"  (1.702 m), weight 74.6 kg (164 lb 7.4 oz), SpO2 100.00%. General appearance: alert and  cooperative Resp: clear to auscultation bilaterally Cardio: regular rate and rhythm, S1, S2 normal, no murmur, click, rub or gallop Extremities: Boggy  Disposition: 01-Home or Self Care   Future Appointments Provider Department Dept Phone   02/26/2013 8:00 AM Melinda [name], PT [facility] [phone]   02/26/2013 8:45 AM Melinda [name], OT [facility] [phone]   05/21/2013 3:00 PM Melinda Novas, MD [facility] [phone]       Medication List    STOP taking these medications       carbidopa-levodopa 25-100 MG per tablet  Commonly known as:  SINEMET      TAKE these medications       buPROPion 150 MG 24 hr tablet  Commonly known as:  WELLBUTRIN XL  Take 450 mg by mouth every morning.     CALTRATE 600+D PO  Take 1 tablet by mouth daily.     clonazePAM 0.5 MG tablet  Commonly known as:  KLONOPIN  Take 0.5 mg by mouth at bedtime as needed. For sleep     estradiol 2 MG tablet  Commonly known as:  ESTRACE  Take 2 mg by mouth daily.     flecainide 100 MG tablet  Commonly known as:  TAMBOCOR  Take 100 mg by mouth 2 (two) times daily.     fludrocortisone 0.1 MG tablet  Commonly known as:  FLORINEF  Take 0.3 mg by mouth daily.     hydrOXYzine 25 MG capsule  Commonly known as:  VISTARIL  Take 25 mg by mouth at bedtime.     levothyroxine 75 MCG tablet  Commonly known as:  SYNTHROID, LEVOTHROID  Take 75 mcg by mouth daily.     metoprolol succinate 25 MG 24 hr  tablet  Commonly known as:  TOPROL-XL  Take 1 tablet (25 mg total) by mouth daily.     midodrine 10 MG tablet  Commonly known as:  PROAMATINE  Take 10 mg by mouth 3 (three) times daily.     PARoxetine 30 MG tablet  Commonly known as:  PAXIL  Take 60 mg by mouth every morning.     potassium chloride 20 MEQ/15ML (10%) solution  Take 15 mLs (20 mEq total) by mouth daily.     Rivaroxaban 20 MG Tabs   Commonly known as:  XARELTO  Take 1 tablet (20 mg total) by mouth daily with supper.     tetrabenazine 12.5 MG tablet  Commonly known as:  XENAZINE  Take 12.5 mg by mouth 2 (two) times daily.        35 minutes were spent in the disposition of this patient. Discussing case with nursing, patient, and her family.  SignedRenford Dills D 02/15/2013, 11:26 AM

## 2013-02-16 ENCOUNTER — Telehealth: Payer: Self-pay | Admitting: Neurology

## 2013-02-20 ENCOUNTER — Encounter: Payer: Self-pay | Admitting: Internal Medicine

## 2013-02-20 NOTE — Telephone Encounter (Signed)
Spoke to daughter Cleora Fleet. Advised patient is on waitlist. Earliest appt is on 03/12/13. Daughter says that is fine. Pt met with another dr who confirmed f/u with neurologist on 03/12/13 will be fine.

## 2013-02-23 ENCOUNTER — Emergency Department (HOSPITAL_COMMUNITY): Payer: Medicare PPO

## 2013-02-23 ENCOUNTER — Observation Stay (HOSPITAL_COMMUNITY): Payer: Medicare PPO

## 2013-02-23 ENCOUNTER — Observation Stay (HOSPITAL_COMMUNITY)
Admission: EM | Admit: 2013-02-23 | Discharge: 2013-02-25 | Disposition: A | Discharge status: Skilled Nursing Facility | Payer: Medicare PPO | Attending: Internal Medicine | Admitting: Internal Medicine

## 2013-02-23 ENCOUNTER — Encounter (HOSPITAL_COMMUNITY): Payer: Self-pay | Admitting: *Deleted

## 2013-02-23 DIAGNOSIS — W19XXXA Unspecified fall, initial encounter: Secondary | ICD-10-CM | POA: Insufficient documentation

## 2013-02-23 DIAGNOSIS — G20 Parkinson's disease: Secondary | ICD-10-CM

## 2013-02-23 DIAGNOSIS — I4891 Unspecified atrial fibrillation: Secondary | ICD-10-CM | POA: Diagnosis present

## 2013-02-23 DIAGNOSIS — Z7901 Long term (current) use of anticoagulants: Secondary | ICD-10-CM | POA: Insufficient documentation

## 2013-02-23 DIAGNOSIS — I951 Orthostatic hypotension: Secondary | ICD-10-CM | POA: Diagnosis present

## 2013-02-23 DIAGNOSIS — G20A1 Parkinson's disease without dyskinesia, without mention of fluctuations: Principal | ICD-10-CM

## 2013-02-23 DIAGNOSIS — M1120 Other chondrocalcinosis, unspecified site: Secondary | ICD-10-CM | POA: Insufficient documentation

## 2013-02-23 DIAGNOSIS — Y92009 Unspecified place in unspecified non-institutional (private) residence as the place of occurrence of the external cause: Secondary | ICD-10-CM | POA: Insufficient documentation

## 2013-02-23 DIAGNOSIS — Z79899 Other long term (current) drug therapy: Secondary | ICD-10-CM | POA: Insufficient documentation

## 2013-02-23 LAB — CBC
MCH: 33.6 pg (ref 26.0–34.0)
Platelets: 289 10*3/uL (ref 150–400)
RBC: 4.35 MIL/uL (ref 3.87–5.11)
RDW: 14.6 % (ref 11.5–15.5)
WBC: 6.3 10*3/uL (ref 4.0–10.5)

## 2013-02-23 LAB — URINALYSIS, ROUTINE W REFLEX MICROSCOPIC
Bilirubin Urine: NEGATIVE
Glucose, UA: NEGATIVE mg/dL
Specific Gravity, Urine: 1.01 (ref 1.005–1.030)
Urobilinogen, UA: 0.2 mg/dL (ref 0.0–1.0)

## 2013-02-23 LAB — URINE MICROSCOPIC-ADD ON

## 2013-02-23 LAB — COMPREHENSIVE METABOLIC PANEL
ALT: 18 U/L (ref 0–35)
AST: 20 U/L (ref 0–37)
Albumin: 3.8 g/dL (ref 3.5–5.2)
CO2: 26 mEq/L (ref 19–32)
Calcium: 9.9 mg/dL (ref 8.4–10.5)
Chloride: 106 mEq/L (ref 96–112)
GFR calc non Af Amer: 73 mL/min — ABNORMAL LOW (ref >90)
Sodium: 140 mEq/L (ref 135–145)

## 2013-02-23 LAB — PRO B NATRIURETIC PEPTIDE: Pro B Natriuretic peptide (BNP): 647.9 pg/mL — ABNORMAL HIGH (ref 0–125)

## 2013-02-23 MED ORDER — BUPROPION HCL ER (XL) 300 MG PO TB24
450.0000 mg | ORAL_TABLET | Freq: Every morning | ORAL | Status: DC
  Administered 2013-02-24 – 2013-02-25 (×2): 450 mg via ORAL
  Filled 2013-02-23 (×2): qty 1

## 2013-02-23 MED ORDER — ONDANSETRON HCL 4 MG PO TABS: 4.0000 mg | ORAL_TABLET | Freq: Four times a day (QID) | ORAL | Status: DC | PRN

## 2013-02-23 MED ORDER — ENOXAPARIN SODIUM 40 MG/0.4ML ~~LOC~~ SOLN
40.0000 mg | SUBCUTANEOUS | Status: DC
  Administered 2013-02-23: 40 mg via SUBCUTANEOUS
  Filled 2013-02-23: qty 0.4

## 2013-02-23 MED ORDER — LEVOTHYROXINE SODIUM 75 MCG PO TABS
75.0000 ug | ORAL_TABLET | Freq: Every day | ORAL | Status: DC
  Administered 2013-02-24 – 2013-02-25 (×2): 75 ug via ORAL
  Filled 2013-02-23 (×3): qty 1

## 2013-02-23 MED ORDER — SODIUM CHLORIDE 0.9 % IV SOLN: 250.0000 mL | INTRAVENOUS | Status: DC | PRN

## 2013-02-23 MED ORDER — CLONAZEPAM 0.5 MG PO TABS: 0.5000 mg | ORAL_TABLET | Freq: Every evening | ORAL | Status: DC | PRN

## 2013-02-23 MED ORDER — ONDANSETRON HCL 4 MG/2ML IJ SOLN: 4.0000 mg | Freq: Four times a day (QID) | INTRAMUSCULAR | Status: DC | PRN

## 2013-02-23 MED ORDER — METOPROLOL SUCCINATE ER 25 MG PO TB24
25.0000 mg | ORAL_TABLET | Freq: Every day | ORAL | Status: DC
  Filled 2013-02-23: qty 1

## 2013-02-23 MED ORDER — RIVAROXABAN 20 MG PO TABS
20.0000 mg | ORAL_TABLET | Freq: Every day | ORAL | Status: DC
  Administered 2013-02-24 – 2013-02-25 (×2): 20 mg via ORAL
  Filled 2013-02-23 (×2): qty 1

## 2013-02-23 MED ORDER — PAROXETINE HCL 30 MG PO TABS
60.0000 mg | ORAL_TABLET | Freq: Every morning | ORAL | Status: DC
  Administered 2013-02-24 – 2013-02-25 (×2): 60 mg via ORAL
  Filled 2013-02-23 (×2): qty 2

## 2013-02-23 MED ORDER — HYDROXYZINE PAMOATE 25 MG PO CAPS
25.0000 mg | ORAL_CAPSULE | Freq: Every day | ORAL | Status: DC
  Filled 2013-02-23 (×3): qty 1

## 2013-02-23 MED ORDER — SODIUM CHLORIDE 0.9 % IJ SOLN: 3.0000 mL | Freq: Two times a day (BID) | INTRAMUSCULAR | Status: DC

## 2013-02-23 MED ORDER — ACETAMINOPHEN 650 MG RE SUPP: 650.0000 mg | Freq: Four times a day (QID) | RECTAL | Status: DC | PRN

## 2013-02-23 MED ORDER — HYDROXYZINE HCL 25 MG PO TABS
25.0000 mg | ORAL_TABLET | Freq: Every day | ORAL | Status: DC
  Administered 2013-02-23 – 2013-02-24 (×2): 25 mg via ORAL
  Filled 2013-02-23 (×3): qty 1

## 2013-02-23 MED ORDER — LEVALBUTEROL HCL 0.63 MG/3ML IN NEBU
0.6300 mg | INHALATION_SOLUTION | Freq: Four times a day (QID) | RESPIRATORY_TRACT | Status: DC | PRN
  Filled 2013-02-23: qty 3

## 2013-02-23 MED ORDER — POTASSIUM CHLORIDE 20 MEQ/15ML (10%) PO LIQD
20.0000 meq | Freq: Every day | ORAL | Status: DC
  Administered 2013-02-24 – 2013-02-25 (×2): 20 meq via ORAL
  Filled 2013-02-23 (×2): qty 15

## 2013-02-23 MED ORDER — DOCUSATE SODIUM 100 MG PO CAPS
100.0000 mg | ORAL_CAPSULE | Freq: Two times a day (BID) | ORAL | Status: DC
  Administered 2013-02-23 – 2013-02-25 (×4): 100 mg via ORAL
  Filled 2013-02-23 (×5): qty 1

## 2013-02-23 MED ORDER — SODIUM CHLORIDE 0.9 % IJ SOLN: 3.0000 mL | INTRAMUSCULAR | Status: DC | PRN

## 2013-02-23 MED ORDER — SODIUM CHLORIDE 0.9 % IJ SOLN
3.0000 mL | Freq: Two times a day (BID) | INTRAMUSCULAR | Status: DC
  Administered 2013-02-23 – 2013-02-25 (×3): 3 mL via INTRAVENOUS

## 2013-02-23 MED ORDER — FLUDROCORTISONE ACETATE 0.1 MG PO TABS
0.3000 mg | ORAL_TABLET | Freq: Every day | ORAL | Status: DC
  Administered 2013-02-24 – 2013-02-25 (×2): 0.3 mg via ORAL
  Filled 2013-02-23 (×2): qty 3

## 2013-02-23 MED ORDER — ACETAMINOPHEN 325 MG PO TABS
650.0000 mg | ORAL_TABLET | Freq: Four times a day (QID) | ORAL | Status: DC | PRN
  Administered 2013-02-24 – 2013-02-25 (×6): 650 mg via ORAL
  Filled 2013-02-23 (×6): qty 2

## 2013-02-23 MED ORDER — FLECAINIDE ACETATE 100 MG PO TABS
100.0000 mg | ORAL_TABLET | Freq: Two times a day (BID) | ORAL | Status: DC
  Administered 2013-02-23 – 2013-02-25 (×4): 100 mg via ORAL
  Filled 2013-02-23 (×5): qty 1

## 2013-02-23 NOTE — ED Notes (Signed)
Pt states she heard a loud noise in her head and then got dizzy and fell , pts speech is slurred and she is weak

## 2013-02-23 NOTE — ED Notes (Signed)
Pt's daughter emily wolf  [phone]

## 2013-02-23 NOTE — ED Provider Notes (Signed)
CSN: [id number]     Arrival date & time 02/23/13  1343 History     First MD Initiated Contact with Patient 02/23/13 1507     Chief Complaint  Patient presents with  . Dizziness   (Consider location/radiation/quality/duration/timing/severity/associated sxs/prior Treatment) The history is provided by the patient.   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 Patient was admitted the July 31 for similar symptoms. The patient was brought in by neighbor today she had fallen at home. Patient stating that she getting a very loud noise in her left ear which is her non-hearing aid ear sound like a locomotive. This was so loud that it made her fall. Patient denies any other complaints. Patient seems to be a little confused but could be due to her hearing problems. Level 5 Caveat   Past Medical History  Diagnosis Date  . Depression   . Anxiety   . Thyroid disease   . Hearing loss   . PONV (postoperative nausea and vomiting)   . Arthritis   . Hypothyroidism   . Tremors of nervous system   . Dysrhythmia     brief rapid afib 01/2012;   john griffin  md  . Orthostatic hypotension   . Sinusitis   . Cancer     uterus removed in 1974  . 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 Back surgery    . Abdominal hysterectomy    . Breast enhancement surgery    . Hammer toe surgery    . Inguinal hernia repair  06/27/2012    Procedure: HERNIA REPAIR INGUINAL ADULT;  Surgeon: Shelly Rubenstein, MD;  Location: [facility] OR;  Service: General;  Laterality: Right;  . Insertion of mesh  06/27/2012    Procedure: INSERTION OF MESH;  Surgeon: Shelly Rubenstein, MD;  Location: [facility] OR;  Service: General;  Laterality: Right;  . Hernia repair    . Inguinal hernia repair Right 11/17/2012    Procedure: REPAIR RIGHT RECURRENT INGUINAL HERNIA;  Surgeon: Shelly Rubenstein, MD;  Location: [facility];  Service: General;  Laterality: Right;  . Insertion of mesh  Right 11/17/2012    Procedure: INSERTION OF MESH;  Surgeon: Shelly Rubenstein, MD;  Location: [facility];  Service: General;  Laterality: Right;   Family History  Problem Relation Age of Onset  . Migraines Mother   . Anxiety disorder Son   . Migraines Daughter   . Anxiety disorder Daughter    History  Substance Use Topics  . Smoking status: Former Smoker -- 0.50 packs/day for 20 years    Types: Cigarettes    Quit date: 07/23/1992  . Smokeless tobacco: Never Used     Comment: quit 15 years ago  . Alcohol Use: No   OB History   Grav Para Term Preterm Abortions TAB SAB Ect Mult Living                 Review of Systems  Constitutional: Negative for fever.  HENT: Positive for tinnitus. Negative for neck pain.   Eyes: Negative for visual disturbance.  Respiratory: Negative for shortness of breath.   Cardiovascular: Negative for chest pain.  Gastrointestinal: Negative for abdominal pain.  Genitourinary: Negative for dysuria.  Neurological: Negative for headaches.  Hematological: Bruises/bleeds easily.  Level 5 Caveat due to some confusion and hearing aid problems  Allergies  Other and Ace inhibitors  Home Medications   Current Outpatient Rx  Name  Route  Sig  Dispense  Refill  . buPROPion (WELLBUTRIN XL) 150 MG 24 hr tablet   Oral   Take 450 mg by mouth every morning.          . Calcium Carbonate-Vitamin D (CALTRATE 600+D PO)   Oral   Take 1 tablet by mouth daily.         . clonazePAM (KLONOPIN) 0.5 MG tablet   Oral   Take 0.5 mg by mouth at bedtime as needed. For sleep         . estradiol (ESTRACE) 2 MG tablet   Oral   Take 2 mg by mouth daily.         . flecainide (TAMBOCOR) 100 MG tablet   Oral   Take 100 mg by mouth 2 (two) times daily.         . fludrocortisone (FLORINEF) 0.1 MG tablet   Oral   Take 0.3 mg by mouth daily.         . hydrOXYzine (VISTARIL) 25 MG capsule   Oral   Take 25 mg by mouth at bedtime.          Marland Kitchen levothyroxine  (SYNTHROID, LEVOTHROID) 75 MCG tablet   Oral   Take 75 mcg by mouth daily.         . metoprolol succinate (TOPROL-XL) 25 MG 24 hr tablet   Oral   Take 1 tablet (25 mg total) by mouth daily.   5 tablet   0     Hold until seen in office   . midodrine (PROAMATINE) 10 MG tablet   Oral   Take 10 mg by mouth 3 (three) times daily.         Marland Kitchen PARoxetine (PAXIL) 30 MG tablet   Oral   Take 60 mg by mouth every morning.         . potassium chloride 20 MEQ/15ML (10%) solution   Oral   Take 15 mLs (20 mEq total) by mouth daily.   500 mL   0   . Rivaroxaban (XARELTO) 20 MG TABS   Oral   Take 1 tablet (20 mg total) by mouth daily with supper.   30 tablet   11   . tetrabenazine (XENAZINE) 12.5 MG tablet   Oral   Take 12.5 mg by mouth 2 (two) times daily.          BP 172/76  Pulse 59  Temp(Src) 97.6 F (36.4 C) (Oral)  Resp 19  SpO2 99% Physical Exam  Nursing note and vitals reviewed. Constitutional: She appears well-developed and well-nourished.  HENT:  Head: Normocephalic and atraumatic.  Right Ear: External ear normal.  Left Ear: External ear normal.  Patient with hearing aid in her right ear.  No carotid bruits.  Eyes: Conjunctivae and EOM are normal. Pupils are equal, round, and reactive to light.  Neck: Normal range of motion.  Cardiovascular: Normal rate, regular rhythm and normal heart sounds.   No murmur heard. Pulmonary/Chest: Effort normal and breath sounds normal. No respiratory distress.  Abdominal: Soft. Bowel sounds are normal. There is no tenderness.  Musculoskeletal: Normal range of motion. She exhibits no edema and no tenderness.  Neurological: She is alert. No cranial nerve deficit. She exhibits normal muscle tone. Coordination normal.  Except for tremor. Patient seems to have some degree of confusion but not sure if this is due to difficulty hearing.  Skin: No rash noted. No erythema.    ED Course   Procedures (including critical  care  time)  Labs Reviewed  COMPREHENSIVE METABOLIC PANEL - Abnormal; Notable for the following:    Total Bilirubin 0.2 (*)    GFR calc non Af Amer 73 (*)    GFR calc Af Amer 85 (*)    All other components within normal limits  URINALYSIS, ROUTINE W REFLEX MICROSCOPIC - Abnormal; Notable for the following:    APPearance HAZY (*)    Hgb urine dipstick TRACE (*)    Leukocytes, UA TRACE (*)    All other components within normal limits  URINE MICROSCOPIC-ADD ON - Abnormal; Notable for the following:    Bacteria, UA FEW (*)    All other components within normal limits  URINE CULTURE  CBC   Dg Chest 2 View  02/23/2013   *RADIOLOGY REPORT*  Clinical Data: Dizziness  CHEST - 2 VIEW  Comparison: July 22, 2012.  Findings: Cardiomediastinal silhouette appears normal.  Elevated right hemidiaphragm is stable.  No acute pulmonary disease is noted.  No pneumothorax or pleural effusion is noted.  Bony thorax is intact.  IMPRESSION: No acute cardiopulmonary abnormality seen.   Original Report Authenticated By: Lupita Raider.,  M.D.   Ct Head Wo Contrast  02/23/2013   *RADIOLOGY REPORT*  Clinical Data: Dizziness  CT HEAD WITHOUT CONTRAST  Technique:  Contiguous axial images were obtained from the base of the skull through the vertex without contrast.  Comparison: 01/22/2011  Findings: Age-related brain atrophy.  No acute intracranial hemorrhage, definite infarction, mass lesion, midline shift, herniation, hydrocephalus, or extra-axial fluid collection.  No focal mass effect or edema.  Normal gray-white matter differentiation.  Cisterns are patent.  No cerebellar abnormality. Orbits are unremarkable.  Mastoids and sinuses clear.  IMPRESSION: Atrophy without acute intracranial process   Original Report Authenticated By: Judie Petit. Miles Costain, M.D.   Results for orders placed during the hospital encounter of 02/23/13  CBC      Result Value Range   WBC 6.3  4.0 - 10.5 K/uL   RBC 4.35  3.87 - 5.11 MIL/uL   Hemoglobin 14.6   12.0 - 15.0 g/dL   HCT 16.1  09.6 - 04.5 %   MCV 98.9  78.0 - 100.0 fL   MCH 33.6  26.0 - 34.0 pg   MCHC 34.0  30.0 - 36.0 g/dL   RDW 40.9  81.1 - 91.4 %   Platelets 289  150 - 400 K/uL  COMPREHENSIVE METABOLIC PANEL      Result Value Range   Sodium 140  135 - 145 mEq/L   Potassium 4.0  3.5 - 5.1 mEq/L   Chloride 106  96 - 112 mEq/L   CO2 26  19 - 32 mEq/L   Glucose, Bld 99  70 - 99 mg/dL   BUN 13  6 - 23 mg/dL   Creatinine, Ser 7.82  0.50 - 1.10 mg/dL   Calcium 9.9  8.4 - 95.6 mg/dL   Total Protein 6.8  6.0 - 8.3 g/dL   Albumin 3.8  3.5 - 5.2 g/dL   AST 20  0 - 37 U/L   ALT 18  0 - 35 U/L   Alkaline Phosphatase 77  39 - 117 U/L   Total Bilirubin 0.2 (*) 0.3 - 1.2 mg/dL   GFR calc non Af Amer 73 (*) >90 mL/min   GFR calc Af Amer 85 (*) >90 mL/min  URINALYSIS, ROUTINE W REFLEX MICROSCOPIC      Result Value Range   Color, Urine YELLOW  YELLOW  APPearance HAZY (*) CLEAR   Specific Gravity, Urine 1.010  1.005 - 1.030   pH 7.0  5.0 - 8.0   Glucose, UA NEGATIVE  NEGATIVE mg/dL   Hgb urine dipstick TRACE (*) NEGATIVE   Bilirubin Urine NEGATIVE  NEGATIVE   Ketones, ur NEGATIVE  NEGATIVE mg/dL   Protein, ur NEGATIVE  NEGATIVE mg/dL   Urobilinogen, UA 0.2  0.0 - 1.0 mg/dL   Nitrite NEGATIVE  NEGATIVE   Leukocytes, UA TRACE (*) NEGATIVE  URINE MICROSCOPIC-ADD ON      Result Value Range   Squamous Epithelial / LPF RARE  RARE   WBC, UA 3-6  <3 WBC/hpf   RBC / HPF 0-2  <3 RBC/hpf   Bacteria, UA FEW (*) RARE    Date: 02/23/2013  Rate: 59  Rhythm: normal sinus rhythm  QRS Axis: normal  Intervals: normal  ST/T Wave abnormalities: nonspecific ST/T changes  Conduction Disutrbances:nonspecific intraventricular conduction delay  Narrative Interpretation:   Old EKG Reviewed: unchanged Assuming changes in EKG compared to her last admission 02/12/2013 some widening of the QRS complexes no evidence of atrial fibrillation you know she has a history of that.     1. Parkinson disease      MDM  Patient lives by herself. Patient was admitted on July 31 for a few days and discharged her in the first part of August. Current symptoms seem to be similar to what the admission was then. Patient lives by herself patient is falling at home brought in by a neighbor. Patient without orthostatic hypotension here today with sitting and 6 laying flat however she is on several and falls at home could be detrimental. Discussed with her primary care doctor's office they recommended admission she may require nursing home placement. Head CT negative today chest x-ray negative EKG without any acute significant changes labs without any significant abnormalities urinalysis which is 3-6 white blood cells but not convinced that there is a urinary tract infection ongoing urine culture is pending. We'll discuss with the hospitalist team for admission.    Shelda Jakes, MD 02/26/13 [phone]

## 2013-02-23 NOTE — H&P (Addendum)
[facility] History and Physical  SCOTLYN MCCRANIE WUJ:[id number] DOB: 05-16-1941 DOA: 02/23/2013  Referring physician:  PCP: Lillia Mountain, MD   Chief Complaint:   HPI:  72 yr old F was brought to the ER  by neighbor today she had a fall at home. Patient states  that she heard  a very loud noise in her left ear which is her non-hearing aid ear  Like the sound of a train blowing a whistle . This was so loud that it startled her  and she subsequently fell. Patient denies any other complaints. Patient seems to be a little confused but could be due to her hearing problems.She was recently admitted for orthostatic hypotension, was offered SNF , but choose to go home .she does have a history of atrial fibrillation, she's currently in normal sinus rhythm .Patient without orthostatic hypotension here today with sitting and 6 laying flat however she is on several and falls at home could be detrimental. Discussed with her primary care doctor's office they recommended admission she may require nursing home placement. Head CT negative today chest x-ray negative EKG without any acute significant changes labs without any significant abnormalities urinalysis which is 3-6 white blood cells but not convinced that there is a urinary tract infection ongoing urine culture is pending.         Review of Systems: negative for the following  Constitutional: Denies fever, chills, diaphoresis, appetite change and fatigue.  HENT: Positive for tinnitus. Negative for neck pain.  Eyes: Negative for visual disturbance.  Respiratory: Negative for shortness of breath.  Cardiovascular: Negative for chest pain.  Gastrointestinal: Negative for abdominal pain.  Genitourinary: Negative for dysuria.  Neurological: Negative for headaches.  Hematological: Bruises/bleeds easily.  Psychiatric/Behavioral: Denies suicidal ideation, mood changes, confusion, nervousness, sleep disturbance and agitation       Past  Medical History  Diagnosis Date  . Depression   . Anxiety   . Thyroid disease   . Hearing loss   . PONV (postoperative nausea and vomiting)   . Arthritis   . Hypothyroidism   . Tremors of nervous system   . Dysrhythmia     brief rapid afib 01/2012;   john griffin  md  . Orthostatic hypotension   . Sinusitis   . Cancer     uterus removed in 1974  . 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 Back surgery    . Abdominal hysterectomy    . Breast enhancement surgery    . Hammer toe surgery    . Inguinal hernia repair  06/27/2012    Procedure: HERNIA REPAIR INGUINAL ADULT;  Surgeon: Shelly Rubenstein, MD;  Location: [facility] OR;  Service: General;  Laterality: Right;  . Insertion of mesh  06/27/2012    Procedure: INSERTION OF MESH;  Surgeon: Shelly Rubenstein, MD;  Location: [facility];  Service: General;  Laterality: Right;  . Hernia repair    . Inguinal hernia repair Right 11/17/2012    Procedure: REPAIR RIGHT RECURRENT INGUINAL HERNIA;  Surgeon: Shelly Rubenstein, MD;  Location: [facility];  Service: General;  Laterality: Right;  . Insertion of mesh Right 11/17/2012    Procedure: INSERTION OF MESH;  Surgeon: Shelly Rubenstein, MD;  Location: [facility];  Service: General;  Laterality: Right;      Social History:  reports that she quit smoking about 20 years ago. Her smoking use included Cigarettes. She has a  10 pack-year smoking history. She has never used smokeless tobacco. She reports that she does not drink alcohol or use illicit drugs.    Allergies  Allergen Reactions  . 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 Ace Inhibitors     Family History  Problem Relation Age of Onset  . Migraines Mother   . Anxiety disorder Son   . Migraines Daughter   . Anxiety disorder Daughter      Prior to Admission medications   Medication Sig Start Date End Date Taking? Authorizing Provider  buPROPion (WELLBUTRIN XL) 150  MG 24 hr tablet Take 450 mg by mouth every morning.    Yes Historical Provider, MD  Calcium Carbonate-Vitamin D (CALTRATE 600+D PO) Take 1 tablet by mouth daily.   Yes Historical Provider, MD  clonazePAM (KLONOPIN) 0.5 MG tablet Take 0.5 mg by mouth at bedtime as needed. For sleep   Yes Historical Provider, MD  estradiol (ESTRACE) 2 MG tablet Take 2 mg by mouth daily.   Yes Historical Provider, MD  flecainide (TAMBOCOR) 100 MG tablet Take 100 mg by mouth 2 (two) times daily.   Yes Historical Provider, MD  fludrocortisone (FLORINEF) 0.1 MG tablet Take 0.3 mg by mouth daily.   Yes Historical Provider, MD  hydrOXYzine (VISTARIL) 25 MG capsule Take 25 mg by mouth at bedtime.  11/14/12  Yes Historical Provider, MD  levothyroxine (SYNTHROID, LEVOTHROID) 75 MCG tablet Take 75 mcg by mouth daily.   Yes Historical Provider, MD  metoprolol succinate (TOPROL-XL) 25 MG 24 hr tablet Take 1 tablet (25 mg total) by mouth daily. 02/15/13  Yes Katy Apo, MD  midodrine (PROAMATINE) 10 MG tablet Take 10 mg by mouth 3 (three) times daily.   Yes Historical Provider, MD  PARoxetine (PAXIL) 30 MG tablet Take 60 mg by mouth every morning.   Yes Historical Provider, MD  potassium chloride 20 MEQ/15ML (10%) solution Take 15 mLs (20 mEq total) by mouth daily. 02/15/13  Yes Katy Apo, MD  Rivaroxaban (XARELTO) 20 MG TABS Take 1 tablet (20 mg total) by mouth daily with supper. 07/24/12  Yes Lillia Mountain, MD  tetrabenazine Guinevere Scarlet) 12.5 MG tablet Take 12.5 mg by mouth 2 (two) times daily.   Yes Historical Provider, MD     Physical Exam: Filed Vitals:   02/23/13 1830 02/23/13 1900 02/23/13 1915 02/23/13 2013  BP: 151/99 176/87 173/92 181/106  Pulse: 58 59 58 62  Temp:    97.5 F (36.4 C)  TempSrc:    Oral  Resp:    18  Weight:    69.1 kg (152 lb 5.4 oz)  SpO2: 99% 99% 98% 99%     Constitutional: Vital signs reviewed. Patient is a well-developed and well-nourished in no acute distress and cooperative with  exam. Alert and oriented x3.  Head: Normocephalic and atraumatic  Ear: TM normal bilaterally  Mouth: no erythema or exudates, MMM  Eyes: PERRL, EOMI, conjunctivae normal, No scleral icterus.  Neck: Supple, Trachea midline normal ROM, No JVD, mass, thyromegaly, or carotid bruit present.  Cardiovascular: RRR, S1 normal, S2 normal, no MRG, pulses symmetric and intact bilaterally  Pulmonary/Chest: CTAB, no wheezes, rales, or rhonchi  Abdominal: Soft. Non-tender, non-distended, bowel sounds are normal, no masses, organomegaly, or guarding present.  GU: no CVA tenderness Musculoskeletal: No joint deformities, erythema, or stiffness, ROM full and no nontender Ext: no edema and no cyanosis, pulses palpable bilaterally (DP and PT)  Hematology: no cervical, inginal,  or axillary adenopathy.  Neurological: A&O x3, Strenght is normal and symmetric bilaterally, cranial nerve II-XII are grossly intact, Except for tremor. Patient seems to have some degree of confusion but not sure if this is due to difficulty hearing  Skin: Warm, dry and intact. No rash, cyanosis, or clubbing.  Psychiatric: Normal mood and affect. speech and behavior is normal. Judgment and thought content normal. Cognition and memory are normal.       Labs on Admission:    Basic Metabolic Panel:  Recent Labs Lab 02/23/13 1400  NA 140  K 4.0  CL 106  CO2 26  GLUCOSE 99  BUN 13  CREATININE 0.80  CALCIUM 9.9   Liver Function Tests:  Recent Labs Lab 02/23/13 1400  AST 20  ALT 18  ALKPHOS 77  BILITOT 0.2*  PROT 6.8  ALBUMIN 3.8   No results found for this basename: LIPASE, AMYLASE,  in the last 168 hours No results found for this basename: AMMONIA,  in the last 168 hours CBC:  Recent Labs Lab 02/23/13 1400  WBC 6.3  HGB 14.6  HCT 43.0  MCV 98.9  PLT 289   Cardiac Enzymes: No results found for this basename: CKTOTAL, CKMB, CKMBINDEX, TROPONINI,  in the last 168 hours  BNP (last 3 results) No results  found for this basename: PROBNP,  in the last 8760 hours    CBG: No results found for this basename: GLUCAP,  in the last 168 hours  Radiological Exams on Admission: Dg Chest 2 View  02/23/2013   *RADIOLOGY REPORT*  Clinical Data: Dizziness  CHEST - 2 VIEW  Comparison: July 22, 2012.  Findings: Cardiomediastinal silhouette appears normal.  Elevated right hemidiaphragm is stable.  No acute pulmonary disease is noted.  No pneumothorax or pleural effusion is noted.  Bony thorax is intact.  IMPRESSION: No acute cardiopulmonary abnormality seen.   Original Report Authenticated By: Lupita Raider.,  M.D.   Dg Knee 1-2 Views Left  02/23/2013   *RADIOLOGY REPORT*  Clinical Data: Knee pain, post fall, evaluate for fracture  LEFT KNEE - 1-2 VIEW  Comparison: None.  Findings: No fracture dislocation.  Joint spaces are preserved. Chondrocalcinosis is noted within the medial and lateral joint spaces.  No evidence of suprapatellar joint effusion.  Regional soft tissues are normal.  No radiopaque foreign body.  IMPRESSION: 1.  No fracture or radiopaque foreign body. 2.  Chondrocalcinosis suggestive of CPPD.   Original Report Authenticated By: Tacey Ruiz, MD   Ct Head Wo Contrast  02/23/2013   *RADIOLOGY REPORT*  Clinical Data: Dizziness  CT HEAD WITHOUT CONTRAST  Technique:  Contiguous axial images were obtained from the base of the skull through the vertex without contrast.  Comparison: 01/22/2011  Findings: Age-related brain atrophy.  No acute intracranial hemorrhage, definite infarction, mass lesion, midline shift, herniation, hydrocephalus, or extra-axial fluid collection.  No focal mass effect or edema.  Normal gray-white matter differentiation.  Cisterns are patent.  No cerebellar abnormality. Orbits are unremarkable.  Mastoids and sinuses clear.  IMPRESSION: Atrophy without acute intracranial process   Original Report Authenticated By: Judie Petit. Miles Costain, M.D.    EKG: Independently reviewed.    Assessment/Plan Active Problems:   * No active hospital problems. *  Falls Multifactorial,doubt orthostatic hypotension, on florinef Needs PT eval and placement    Parkinson's ds Continue sinemet    A fib  Currently in sinus rhythm  Continue flecanide , metoprol  On xarelto  Code Status:   full  Family Communication: bedside Disposition Plan: admit   Time spent: 70 mins   [facility] Pager [phone]  If 7PM-7AM, please contact night-coverage [email or web address] Password [facility] 02/23/2013, 9:50 PM

## 2013-02-23 NOTE — ED Notes (Signed)
Pt c/o hearing a sound like a locomotor in her L ear at 11 am.  Whenever she would hear the sound, it would cause her head to shake and she would lose her balance.  She fell to her knees once this am b/c of the loss of balance, but denies loc or hitting her head.  L hearing aid removed in triage (placed in purse) to see if this would prevent noise from re-occuring.

## 2013-02-24 ENCOUNTER — Telehealth: Payer: Self-pay | Admitting: Neurology

## 2013-02-24 LAB — CBC
Platelets: 252 10*3/uL (ref 150–400)
RBC: 4.05 MIL/uL (ref 3.87–5.11)
RDW: 14.8 % (ref 11.5–15.5)
WBC: 6.7 10*3/uL (ref 4.0–10.5)

## 2013-02-24 LAB — TSH
TSH: 3.742 u[IU]/mL (ref 0.350–4.500)
TSH: 3.658 u[IU]/mL (ref 0.350–4.500)

## 2013-02-24 LAB — TROPONIN I
Troponin I: <0.3 ng/mL (ref <0.30)
Troponin I: <0.3 ng/mL (ref <0.30)

## 2013-02-24 LAB — COMPREHENSIVE METABOLIC PANEL
Alkaline Phosphatase: 75 U/L (ref 39–117)
BUN: 11 mg/dL (ref 6–23)
Chloride: 105 mEq/L (ref 96–112)
Creatinine, Ser: 0.78 mg/dL (ref 0.50–1.10)
GFR calc Af Amer: >90 mL/min (ref >90)
Glucose, Bld: 90 mg/dL (ref 70–99)
Potassium: 3.8 mEq/L (ref 3.5–5.1)
Total Bilirubin: 0.3 mg/dL (ref 0.3–1.2)

## 2013-02-24 LAB — HEMOGLOBIN A1C: Mean Plasma Glucose: 108 mg/dL (ref <117)

## 2013-02-24 MED ORDER — MIDODRINE HCL 5 MG PO TABS
10.0000 mg | ORAL_TABLET | Freq: Three times a day (TID) | ORAL | Status: DC
  Administered 2013-02-24 (×2): 10 mg via ORAL
  Filled 2013-02-24 (×5): qty 2

## 2013-02-24 MED ORDER — MIDODRINE HCL 5 MG PO TABS
10.0000 mg | ORAL_TABLET | Freq: Three times a day (TID) | ORAL | Status: DC
  Filled 2013-02-24 (×4): qty 2

## 2013-02-24 MED ORDER — ENSURE COMPLETE PO LIQD: 237.0000 mL | Freq: Every day | ORAL | Status: DC | PRN

## 2013-02-24 MED ORDER — OXYCODONE HCL 5 MG PO TABS
5.0000 mg | ORAL_TABLET | Freq: Once | ORAL | Status: AC
  Administered 2013-02-24: 5 mg via ORAL
  Filled 2013-02-24: qty 1

## 2013-02-24 NOTE — Progress Notes (Signed)
  Pt admitted to the unit. Pt is stable, alert and oriented per baseline. Oriented to room, staff, and call bell. Educated to call for any assistance. Bed in lowest position, call bell within reach- will continue to monitor. 

## 2013-02-24 NOTE — Progress Notes (Signed)
Subjective: No cojmplaints  Objective: Vital signs in last 24 hours: Temp:  [97.2 F (36.2 C)-97.6 F (36.4 C)] 97.2 F (36.2 C) (08/12 0516) Pulse Rate:  [57-96] 96 (08/12 0703) Resp:  [16-19] 18 (08/12 0516) BP: (106-181)/(64-150) 106/73 mmHg (08/12 0703) SpO2:  [96 %-99 %] 96 % (08/12 0516) Weight:  [69.1 kg (152 lb 5.4 oz)] 69.1 kg (152 lb 5.4 oz) (08/11 2013) Weight change:  Last BM Date: 02/22/13  Intake/Output from previous day:   Intake/Output this shift:    General appearance: alert and cooperative Resp: clear to auscultation bilaterally Cardio: regular rate and rhythm, S1, S2 normal, no murmur, click, rub or gallop Neurologic: Cranial nerves: normal Motor: grossly normal  Lab Results:  Recent Labs  02/23/13 1400 02/24/13 0450  WBC 6.3 6.7  HGB 14.6 13.7  HCT 43.0 40.0  PLT 289 252   BMET  Recent Labs  02/23/13 1400 02/24/13 0450  NA 140 141  K 4.0 3.8  CL 106 105  CO2 26 25  GLUCOSE 99 90  BUN 13 11  CREATININE 0.80 0.78  CALCIUM 9.9 9.5    Studies/Results: Dg Chest 2 View  02/23/2013   *RADIOLOGY REPORT*  Clinical Data: Dizziness  CHEST - 2 VIEW  Comparison: July 22, 2012.  Findings: Cardiomediastinal silhouette appears normal.  Elevated right hemidiaphragm is stable.  No acute pulmonary disease is noted.  No pneumothorax or pleural effusion is noted.  Bony thorax is intact.  IMPRESSION: No acute cardiopulmonary abnormality seen.   Original Report Authenticated By: Lupita Raider.,  M.D.   Dg Knee 1-2 Views Left  02/23/2013   *RADIOLOGY REPORT*  Clinical Data: Knee pain, post fall, evaluate for fracture  LEFT KNEE - 1-2 VIEW  Comparison: None.  Findings: No fracture dislocation.  Joint spaces are preserved. Chondrocalcinosis is noted within the medial and lateral joint spaces.  No evidence of suprapatellar joint effusion.  Regional soft tissues are normal.  No radiopaque foreign body.  IMPRESSION: 1.  No fracture or radiopaque foreign body. 2.   Chondrocalcinosis suggestive of CPPD.   Original Report Authenticated By: Tacey Ruiz, MD   Ct Head Wo Contrast  02/23/2013   *RADIOLOGY REPORT*  Clinical Data: Dizziness  CT HEAD WITHOUT CONTRAST  Technique:  Contiguous axial images were obtained from the base of the skull through the vertex without contrast.  Comparison: 01/22/2011  Findings: Age-related brain atrophy.  No acute intracranial hemorrhage, definite infarction, mass lesion, midline shift, herniation, hydrocephalus, or extra-axial fluid collection.  No focal mass effect or edema.  Normal gray-white matter differentiation.  Cisterns are patent.  No cerebellar abnormality. Orbits are unremarkable.  Mastoids and sinuses clear.  IMPRESSION: Atrophy without acute intracranial process   Original Report Authenticated By: Judie Petit. Miles Costain, M.D.    Medications: I have reviewed the patient's current medications.  Assessment/Plan: Principal Problem:   Parkinson disease  Had fall after hearing loud sound in L ear, no focal neurological signs and not severely orthostatic.  Will have PT see and then hope to discharge in am. Active Problems:   Orthostatic hypotension acceptable on meds   Atrial fibrillation in NSR on flecainide.  Metoprolol was stopped last admission.   LOS: 1 day   Melinda [name] 02/24/2013, 7:32 AM

## 2013-02-24 NOTE — Progress Notes (Signed)
Contacted MD about BP elevated, 185/98 at 1605 and 178/92 at 1700 done manually. Orders to hold midrodrine for BP >170 SBP. Peter Congo RN

## 2013-02-24 NOTE — Clinical Social Work Psychosocial (Signed)
Clinical Social Work Department BRIEF PSYCHOSOCIAL ASSESSMENT 02/24/2013  Patient:  Melinda [name], Melinda [name]     Account Number:  [id number]     Admit date:  02/23/2013  Clinical Social Worker:  Lavell Luster  Date/Time:  02/24/2013 11:00 AM  Referred by:  Physician  Date Referred:  02/24/2013 Referred for  SNF Placement   Other Referral:   Interview type:  Other - See comment Other interview type:   CSW interviewed patient and daughter.    PSYCHOSOCIAL DATA Living Status:  ALONE Admitted from facility:   Level of care:   Primary support name:  Melinda [name] [phone] Primary support relationship to patient:  CHILD, ADULT Degree of support available:   Daughter is very supportive, but lives in [place]. Other daughter lives in [place].    CURRENT CONCERNS Current Concerns  Post-Acute Placement   Other Concerns:    SOCIAL WORK ASSESSMENT / PLAN CSW met with patient first. Patient initially refused SNF placement. After patient and patient's daughter discussed the recommedation for SNF, patient wants SNF. Patients daughter explained that she will not be able to care for patient, as patient was expecting. CSW explained SNF search process to patient and daughter. Patient and daughter are agreeable to going forward with SNF search. Patient has preference for [facility], [facility], [facility] [address], [facility], and [facility]. Daughter Melinda [name] is the main family contact for patient [phone]. Patient wants to be faxed out to [facility] facilities.   Assessment/plan status:  Psychosocial Support/Ongoing Assessment of Needs Other assessment/ plan:   Complete FL2, PASRR, Fax out   Information/referral to community resources:   Patient and daughter given SNF list for review.    PATIENT'S/FAMILY'S RESPONSE TO PLAN OF CARE: Patient and daughter are agreeable to SNF search and await bed offers which should be given on 02/25/13.       Melinda [name], [place], [place], [phone]

## 2013-02-24 NOTE — Evaluation (Signed)
Physical Therapy Evaluation Patient Details Name: Melinda [name] MRN: [id number] DOB: Sep 12, 1940 Today's Date: 02/24/2013 Time: 4098-1191 PT Time Calculation (min): 25 min  PT Assessment / Plan / Recommendation History of Present Illness  Pt is a 72 y/o female admitted for orthostasis and progressive unsteady gait over several days. Pt sustained a fall after hearing a loud noise in her L ear which startled her and caused her to fall.   Clinical Impression  Pt reports feeling a gradual decrease of strength and balance over time. At time of PT evaluation, pt demonstrated significant balance disturbances during static standing at EOB. Pt had 2 posterior LOB episodes with minor perturbations where pt was lowered by PT to sit on bed. During these times pt did not attempt to use UE's at all to brace herself or try to regain balance. Pt currently does not appear to be safe to return home alone, and although pt declines short term rehab, d/c to SNF may be the safest option for her if no family is available to care for her at home.    PT Assessment  Patient needs continued PT services    Follow Up Recommendations  SNF    Does the patient have the potential to tolerate intense rehabilitation      Barriers to Discharge Decreased caregiver support Unsure of who will be available to care for her at home. Pt needs 24 hr supervision if returning home.    Equipment Recommendations       Recommendations for Other Services     Frequency Min 3X/week    Precautions / Restrictions Precautions Precautions: Fall Precaution Comments: RN reports pt was getting up to [facility] on her own this a.m. Reiterated fall risk and need to call for assist with pt and daughter Restrictions Weight Bearing Restrictions: No   Pertinent Vitals/Pain Pt reports no pain throughout session, however reports dizziness and headache at end of session.      Mobility  Bed Mobility Bed Mobility: Supine to Sit;Sitting - Scoot to  Edge of Bed Supine to Sit: 6: Modified independent (Device/Increase time);With rails;HOB flat Sitting - Scoot to Edge of Bed: 6: Modified independent (Device/Increase time) Details for Bed Mobility Assistance: Pt required increased cueing for pt to initiate movement. Possibly due to [facility] Transfers Transfers: Sit to Stand;Stand to Sit Sit to Stand: 4: Min guard;From bed;With upper extremity assist Stand to Sit: 4: Min guard;To bed;With upper extremity assist Details for Transfer Assistance: Transfered once with no AD, and once with RW available Ambulation/Gait Ambulation/Gait Assistance: 4: Min guard Ambulation Distance (Feet): 50 Feet Assistive device: Rolling walker Ambulation/Gait Assistance Details: Limited by fatigue and dizziness Gait Pattern: Step-through pattern Gait velocity: decreased Stairs: No    Exercises     PT Diagnosis: Difficulty walking  PT Problem List: Decreased strength;Decreased activity tolerance;Decreased balance;Decreased mobility;Decreased knowledge of use of DME;Decreased safety awareness PT Treatment Interventions: DME instruction;Gait training;Stair training;Functional mobility training;Therapeutic activities;Therapeutic exercise;Balance training;Neuromuscular re-education;Patient/family education     PT Goals(Current goals can be found in the care plan section) Acute Rehab PT Goals Patient Stated Goal: wants to return home alone PT Goal Formulation: With patient Time For Goal Achievement: 03/03/13 Potential to Achieve Goals: Fair  Visit Information  Last PT Received On: 02/24/13 Assistance Needed: +1 History of Present Illness: Pt is a 72 y/o female admitted for orthostasis and progressive unsteady gait over several days. Pt sustained a fall after hearing a loud noise in her L ear which startled her and caused her  to fall.        Prior Functioning  Home Living Family/patient expects to be discharged to:: Private residence Living Arrangements:  Alone Available Help at Discharge: Family (Unclear how often family would be available to help) Type of Home: House Home Access: Stairs to enter [employer] of Steps: 2 Entrance Stairs-Rails: None Home Layout: One level Home Equipment: Environmental consultant - 2 wheels Prior Function Level of Independence: Independent Communication Communication: HOH Dominant Hand: Right    Cognition  Cognition Arousal/Alertness: Awake/alert Behavior During Therapy: Flat affect Overall Cognitive Status: No family/caregiver present to determine baseline cognitive functioning Area of Impairment: Safety/judgement;Awareness Safety/Judgement: Decreased awareness of safety;Decreased awareness of deficits    Extremity/Trunk Assessment Upper Extremity Assessment Upper Extremity Assessment: Defer to OT evaluation Lower Extremity Assessment Lower Extremity Assessment: Overall WFL for tasks assessed (At least 4/5 strength bilaterally with MMT) Cervical / Trunk Assessment Cervical / Trunk Assessment: Kyphotic   Balance Balance Balance Assessed: Yes Static Sitting Balance Static Sitting - Balance Support: No upper extremity supported;Feet supported Static Sitting - Level of Assistance: 6: Modified independent (Device/Increase time) Static Sitting - Comment/# of Minutes: 5 Static Standing Balance Static Standing - Balance Support: No upper extremity supported (With perturbations ) Static Standing - Level of Assistance: 3: Mod assist;2: Max assist Static Standing - Comment/# of Minutes: 2  End of Session PT - End of Session Equipment Utilized During Treatment: Gait belt Activity Tolerance: Patient tolerated treatment well Patient left: in bed;with call bell/phone within reach Nurse Communication: Mobility status  GP Functional Assessment Tool Used: clinical judgement Functional Limitation: Mobility: Walking and moving around Mobility: Walking and Moving Around Current Status [phone]): At least 60 percent but  less than 80 percent impaired, limited or restricted Mobility: Walking and Moving Around Goal Status [phone]): At least 60 percent but less than 80 percent impaired, limited or restricted   Ruthann Cancer 02/24/2013, 11:24 AM  Ruthann Cancer, PT Acute Rehabilitation Services

## 2013-02-24 NOTE — Progress Notes (Signed)
Subjective: Nursing called about elevated blood pressures and symptoms to suggest some vertigo. Seems to be possibly positional. Has a history of orthostatic hypotension  Objective: Weight change:   Intake/Output Summary (Last 24 hours) at 02/24/13 1755 Last data filed at 02/24/13 0926  Gross per 24 hour  Intake    240 ml  Output      0 ml  Net    240 ml   Filed Vitals:   02/24/13 0658 02/24/13 0703 02/24/13 1605 02/24/13 1703  BP: 143/94 106/73 185/98 178/92  Pulse: 79 96 69   Temp:   97.4 F (36.3 C)   TempSrc:   Oral   Resp:   18   Height:      Weight:      SpO2:   96%     Lab Results: Results for orders placed during the hospital encounter of 02/23/13 (from the past 48 hour(s))  CBC     Status: None   Collection Time    02/23/13  2:00 PM      Result Value Range   WBC 6.3  4.0 - 10.5 K/uL   RBC 4.35  3.87 - 5.11 MIL/uL   Hemoglobin 14.6  12.0 - 15.0 g/dL   HCT 16.1  09.6 - 04.5 %   MCV 98.9  78.0 - 100.0 fL   MCH 33.6  26.0 - 34.0 pg   MCHC 34.0  30.0 - 36.0 g/dL   RDW 40.9  81.1 - 91.4 %   Platelets 289  150 - 400 K/uL  COMPREHENSIVE METABOLIC PANEL     Status: Abnormal   Collection Time    02/23/13  2:00 PM      Result Value Range   Sodium 140  135 - 145 mEq/L   Potassium 4.0  3.5 - 5.1 mEq/L   Chloride 106  96 - 112 mEq/L   CO2 26  19 - 32 mEq/L   Glucose, Bld 99  70 - 99 mg/dL   BUN 13  6 - 23 mg/dL   Creatinine, Ser 7.82  0.50 - 1.10 mg/dL   Calcium 9.9  8.4 - 95.6 mg/dL   Total Protein 6.8  6.0 - 8.3 g/dL   Albumin 3.8  3.5 - 5.2 g/dL   AST 20  0 - 37 U/L   ALT 18  0 - 35 U/L   Alkaline Phosphatase 77  39 - 117 U/L   Total Bilirubin 0.2 (*) 0.3 - 1.2 mg/dL   GFR calc non Af Amer 73 (*) >90 mL/min   GFR calc Af Amer 85 (*) >90 mL/min   Comment:            The eGFR has been calculated     using the CKD EPI equation.     This calculation has not been     validated in all clinical     situations.     eGFR's persistently     <90 mL/min signify      possible Chronic Kidney Disease.     Performed at [facility]  URINALYSIS, ROUTINE W REFLEX MICROSCOPIC     Status: Abnormal   Collection Time    02/23/13  5:28 PM      Result Value Range   Color, Urine YELLOW  YELLOW   APPearance HAZY (*) CLEAR   Specific Gravity, Urine 1.010  1.005 - 1.030   pH 7.0  5.0 - 8.0   Glucose, UA NEGATIVE  NEGATIVE mg/dL  Hgb urine dipstick TRACE (*) NEGATIVE   Bilirubin Urine NEGATIVE  NEGATIVE   Ketones, ur NEGATIVE  NEGATIVE mg/dL   Protein, ur NEGATIVE  NEGATIVE mg/dL   Urobilinogen, UA 0.2  0.0 - 1.0 mg/dL   Nitrite NEGATIVE  NEGATIVE   Leukocytes, UA TRACE (*) NEGATIVE  URINE MICROSCOPIC-ADD ON     Status: Abnormal   Collection Time    02/23/13  5:28 PM      Result Value Range   Squamous Epithelial / LPF RARE  RARE   WBC, UA 3-6  <3 WBC/hpf   RBC / HPF 0-2  <3 RBC/hpf   Bacteria, UA FEW (*) RARE  TROPONIN I     Status: None   Collection Time    02/23/13  9:40 PM      Result Value Range   Troponin I <0.30  <0.30 ng/mL   Comment:            Due to the release kinetics of cTnI,     a negative result within the first hours     of the onset of symptoms does not rule out     myocardial infarction with certainty.     If myocardial infarction is still suspected,     repeat the test at appropriate intervals.  PRO B NATRIURETIC PEPTIDE     Status: Abnormal   Collection Time    02/23/13  9:40 PM      Result Value Range   Pro B Natriuretic peptide (BNP) 647.9 (*) 0 - 125 pg/mL  TSH     Status: None   Collection Time    02/23/13  9:40 PM      Result Value Range   TSH 3.742  0.350 - 4.500 uIU/mL   Comment: Performed at [facility]  TROPONIN I     Status: None   Collection Time    02/24/13  4:50 AM      Result Value Range   Troponin I <0.30  <0.30 ng/mL   Comment:            Due to the release kinetics of cTnI,     a negative result within the first hours     of the onset of symptoms does not rule out      myocardial infarction with certainty.     If myocardial infarction is still suspected,     repeat the test at appropriate intervals.  COMPREHENSIVE METABOLIC PANEL     Status: Abnormal   Collection Time    02/24/13  4:50 AM      Result Value Range   Sodium 141  135 - 145 mEq/L   Potassium 3.8  3.5 - 5.1 mEq/L   Chloride 105  96 - 112 mEq/L   CO2 25  19 - 32 mEq/L   Glucose, Bld 90  70 - 99 mg/dL   BUN 11  6 - 23 mg/dL   Creatinine, Ser 6.57  0.50 - 1.10 mg/dL   Calcium 9.5  8.4 - 84.6 mg/dL   Total Protein 6.1  6.0 - 8.3 g/dL   Albumin 3.4 (*) 3.5 - 5.2 g/dL   AST 18  0 - 37 U/L   ALT 16  0 - 35 U/L   Alkaline Phosphatase 75  39 - 117 U/L   Total Bilirubin 0.3  0.3 - 1.2 mg/dL   GFR calc non Af Amer 83 (*) >90 mL/min   GFR calc Af Amer >90  >90  mL/min   Comment:            The eGFR has been calculated     using the CKD EPI equation.     This calculation has not been     validated in all clinical     situations.     eGFR's persistently     <90 mL/min signify     possible Chronic Kidney Disease.  CBC     Status: None   Collection Time    02/24/13  4:50 AM      Result Value Range   WBC 6.7  4.0 - 10.5 K/uL   RBC 4.05  3.87 - 5.11 MIL/uL   Hemoglobin 13.7  12.0 - 15.0 g/dL   HCT 16.1  09.6 - 04.5 %   MCV 98.8  78.0 - 100.0 fL   MCH 33.8  26.0 - 34.0 pg   MCHC 34.3  30.0 - 36.0 g/dL   RDW 40.9  81.1 - 91.4 %   Platelets 252  150 - 400 K/uL  HEMOGLOBIN A1C     Status: None   Collection Time    02/24/13  4:50 AM      Result Value Range   Hemoglobin A1C 5.4  <5.7 %   Comment: (NOTE)                                                                               According to the ADA Clinical Practice Recommendations for 2011, when     HbA1c is used as a screening test:      >=6.5%   Diagnostic of Diabetes Mellitus               (if abnormal result is confirmed)     5.7-6.4%   Increased risk of developing Diabetes Mellitus     References:Diagnosis and Classification of  Diabetes Mellitus,Diabetes     Care,2011,34(Suppl 1):S62-S69 and Standards of Medical Care in             Diabetes - 2011,Diabetes Care,2011,34 (Suppl 1):S11-S61.   Mean Plasma Glucose 108  <117 mg/dL   Comment: Performed at [facility]  TSH     Status: None   Collection Time    02/24/13  4:50 AM      Result Value Range   TSH 3.658  0.350 - 4.500 uIU/mL   Comment: Performed at [facility]  TROPONIN I     Status: None   Collection Time    02/24/13  9:25 AM      Result Value Range   Troponin I <0.30  <0.30 ng/mL   Comment:            Due to the release kinetics of cTnI,     a negative result within the first hours     of the onset of symptoms does not rule out     myocardial infarction with certainty.     If myocardial infarction is still suspected,     repeat the test at appropriate intervals.    Studies/Results: Dg Chest 2 View  02/23/2013   *RADIOLOGY REPORT*  Clinical Data: Dizziness  CHEST - 2 VIEW  Comparison: July 22, 2012.  Findings: Cardiomediastinal silhouette appears normal.  Elevated right hemidiaphragm is stable.  No acute pulmonary disease is noted.  No pneumothorax or pleural effusion is noted.  Bony thorax is intact.  IMPRESSION: No acute cardiopulmonary abnormality seen.   Original Report Authenticated By: Lupita Raider.,  M.D.   Dg Knee 1-2 Views Left  02/23/2013   *RADIOLOGY REPORT*  Clinical Data: Knee pain, post fall, evaluate for fracture  LEFT KNEE - 1-2 VIEW  Comparison: None.  Findings: No fracture dislocation.  Joint spaces are preserved. Chondrocalcinosis is noted within the medial and lateral joint spaces.  No evidence of suprapatellar joint effusion.  Regional soft tissues are normal.  No radiopaque foreign body.  IMPRESSION: 1.  No fracture or radiopaque foreign body. 2.  Chondrocalcinosis suggestive of CPPD.   Original Report Authenticated By: Tacey Ruiz, MD   Ct Head Wo Contrast  02/23/2013   *RADIOLOGY REPORT*  Clinical Data:  Dizziness  CT HEAD WITHOUT CONTRAST  Technique:  Contiguous axial images were obtained from the base of the skull through the vertex without contrast.  Comparison: 01/22/2011  Findings: Age-related brain atrophy.  No acute intracranial hemorrhage, definite infarction, mass lesion, midline shift, herniation, hydrocephalus, or extra-axial fluid collection.  No focal mass effect or edema.  Normal gray-white matter differentiation.  Cisterns are patent.  No cerebellar abnormality. Orbits are unremarkable.  Mastoids and sinuses clear.  IMPRESSION: Atrophy without acute intracranial process   Original Report Authenticated By: Judie Petit. Miles Costain, M.D.   Medications: Scheduled Meds: . buPROPion  450 mg Oral q morning - 10a  . docusate sodium  100 mg Oral BID  . flecainide  100 mg Oral BID  . fludrocortisone  0.3 mg Oral Daily  . hydrOXYzine  25 mg Oral QHS  . levothyroxine  75 mcg Oral QAC breakfast  . midodrine  10 mg Oral TID WC  . PARoxetine  60 mg Oral q morning - 10a  . potassium chloride  20 mEq Oral Daily  . Rivaroxaban  20 mg Oral Q supper  . sodium chloride  3 mL Intravenous Q12H  . sodium chloride  3 mL Intravenous Q12H   Continuous Infusions:  PRN Meds:.sodium chloride, acetaminophen, acetaminophen, clonazePAM, feeding supplement, levalbuterol, ondansetron (ZOFRAN) IV, ondansetron, sodium chloride  Assessment/Plan:  Elevated blood pressure - hold midodrine for now. Continue Florinef Possible positional vertigo - consider physical therapy for treatment of positional vertigo. Dr. Kirby Funk to further evaluateon 8/13   LOS: 1 day   Pearla Dubonnet, MD 02/24/2013, 5:55 PM

## 2013-02-24 NOTE — Progress Notes (Signed)
INITIAL NUTRITION ASSESSMENT  DOCUMENTATION CODES Per approved criteria  -Not Applicable   INTERVENTION:  Ensure Complete daily PRN (350 kcals, 13 gm protein per 8 fl oz bottle) RD to follow for nutrition care plan  NUTRITION DIAGNOSIS: Unintended weight loss related to inability to manage self-care as evidenced by 7% weight loss in < 2 weeks  Goal: Pt to meet >/= 90% of their estimated nutrition needs   Monitor:  PO & supplemental intake, weight, labs, I/O's  Reason for Assessment: Malnutrition Screening Tool Report  72 y.o. female  Admitting Dx: Parkinson disease  ASSESSMENT: Patient admitted for orthostasis & progressive unsteady gait over several days; sustained a fall after hearing a loud noise in her L ear which startled her and caused her to fall.   Patient known to this RD from previous hospitalization at end of July 2014; sleeping upon RD visit; PO intake 60% this AM for breakfast; per weight records, patient has had a 7% weight loss in < 2 weeks (since last admission) ---> severe for time frame; would benefit from addition of supplements on as needed basis ---> RD to order.  Height: Ht Readings from Last 1 Encounters:  02/23/13 5\' 7"  (1.702 m)    Weight: Wt Readings from Last 1 Encounters:  02/23/13 152 lb 5.4 oz (69.1 kg)    Ideal Body Weight: 135 lb  % Ideal Body Weight: 112%  Wt Readings from Last 10 Encounters:  02/23/13 152 lb 5.4 oz (69.1 kg)  02/12/13 164 lb 7.4 oz (74.6 kg)  01/29/13 167 lb (75.751 kg)  12/10/12 170 lb 9.6 oz (77.384 kg)  11/11/12 170 lb (77.111 kg)  10/20/12 173 lb 3.2 oz (78.563 kg)  07/24/12 173 lb (78.472 kg)  07/22/12 169 lb 9.6 oz (76.93 kg)  06/18/12 171 lb 11.2 oz (77.883 kg)  06/03/12 169 lb 9.6 oz (76.93 kg)    Usual Body Weight: 170 lb  % Usual Body Weight: 89%  BMI:  Body mass index is 23.85 kg/(m^2).  Estimated Nutritional Needs: Kcal: 1700-1900 Protein: 80-90 gm Fluid: 1.7-1.9 L  Skin: Intact  Diet  Order: Cardiac  EDUCATION NEEDS: -No education needs identified at this time   Intake/Output Summary (Last 24 hours) at 02/24/13 1527 Last data filed at 02/24/13 0926  Gross per 24 hour  Intake    240 ml  Output      0 ml  Net    240 ml    Labs:   Recent Labs Lab 02/23/13 1400 02/24/13 0450  NA 140 141  K 4.0 3.8  CL 106 105  CO2 26 25  BUN 13 11  CREATININE 0.80 0.78  CALCIUM 9.9 9.5  GLUCOSE 99 90     Scheduled Meds: . buPROPion  450 mg Oral q morning - 10a  . docusate sodium  100 mg Oral BID  . flecainide  100 mg Oral BID  . fludrocortisone  0.3 mg Oral Daily  . hydrOXYzine  25 mg Oral QHS  . levothyroxine  75 mcg Oral QAC breakfast  . midodrine  10 mg Oral TID WC  . PARoxetine  60 mg Oral q morning - 10a  . potassium chloride  20 mEq Oral Daily  . Rivaroxaban  20 mg Oral Q supper  . sodium chloride  3 mL Intravenous Q12H  . sodium chloride  3 mL Intravenous Q12H    Continuous Infusions:   Past Medical History  Diagnosis Date  . Depression   . Anxiety   .  Thyroid disease   . Hearing loss   . PONV (postoperative nausea and vomiting)   . Arthritis   . Hypothyroidism   . Tremors of nervous system   . Dysrhythmia     brief rapid afib 01/2012;   john griffin  md  . Orthostatic hypotension   . Sinusitis   . Cancer     uterus removed in 1974  . 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 Back surgery    . Abdominal hysterectomy    . Breast enhancement surgery    . Hammer toe surgery    . Inguinal hernia repair  06/27/2012    Procedure: HERNIA REPAIR INGUINAL ADULT;  Surgeon: Shelly Rubenstein, MD;  Location: [facility] OR;  Service: General;  Laterality: Right;  . Insertion of mesh  06/27/2012    Procedure: INSERTION OF MESH;  Surgeon: Shelly Rubenstein, MD;  Location: [facility] OR;  Service: General;  Laterality: Right;  . Hernia repair    . Inguinal hernia repair Right 11/17/2012    Procedure:  REPAIR RIGHT RECURRENT INGUINAL HERNIA;  Surgeon: Shelly Rubenstein, MD;  Location: [facility] OR;  Service: General;  Laterality: Right;  . Insertion of mesh Right 11/17/2012    Procedure: INSERTION OF MESH;  Surgeon: Shelly Rubenstein, MD;  Location: [facility] OR;  Service: General;  Laterality: Right;    Maureen Chatters, RD, LDN Pager #: [phone] After-Hours Pager #: [phone]

## 2013-02-24 NOTE — Care Management Note (Signed)
    Page 1 of 2   02/25/2013     10:32:42 AM   CARE MANAGEMENT NOTE 02/25/2013  Patient:  Melinda [name], Melinda [name]   Account Number:  [id number]  Date Initiated:  02/24/2013  Documentation initiated by:  Letha Cape  Subjective/Objective Assessment:   dx fall, ortho hypotension  admit as observation- lives with daughter.     Action/Plan:   pt eval- rec snf- pt refusing snf.   Anticipated DC Date:  02/25/2013   Anticipated DC Plan:  SKILLED NURSING FACILITY  In-house referral  Clinical Social Worker      DC Associate Professor  CM consult      [facility] Choice  HOME HEALTH   Choice offered to / List presented to:          [facility] arranged  HH-1 RN  HH-2 PT  HH-3 OT  HH-4 NURSE'S AIDE  HH-6 SOCIAL WORKER      HH agency  [facility]   Status of service:  Completed, signed off Medicare Important Message given?   (If response is "NO", the following Medicare IM given date fields will be blank) Date Medicare IM given:   Date Additional Medicare IM given:    Discharge Disposition:  SKILLED NURSING FACILITY  Per UR Regulation:  Reviewed for med. necessity/level of care/duration of stay  If discussed at Long Length of Stay Meetings, dates discussed:    Comments:  02/24/13 10:31 Letha Cape RN, BSN [phone] patient for dc to snf today, CSW following.  02/23/13 15:45 Letha Cape RN, BSN [phone] patient lives alone, per physical therapy recs snf, patient is refusing snf, I spoke with her daughter in [place] and she thinks patient should go to snf for short term.  Patient is determined to go home with home health, she chose [facility]  from agency list, for [facility], PT, OT , aide and Child psychotherapist.  Referral made to [facility], Lupita Leash notified. Soc will begin 24-48 hrs post discharge.

## 2013-02-25 ENCOUNTER — Telehealth: Payer: Self-pay | Admitting: Neurology

## 2013-02-25 ENCOUNTER — Ambulatory Visit: Payer: Self-pay | Admitting: Neurology

## 2013-02-25 LAB — URINE CULTURE: Colony Count: NO GROWTH

## 2013-02-25 MED ORDER — MIDODRINE HCL 5 MG PO TABS: 5.0000 mg | ORAL_TABLET | Freq: Three times a day (TID) | ORAL | Status: DC

## 2013-02-25 MED ORDER — MIDODRINE HCL 5 MG PO TABS
5.0000 mg | ORAL_TABLET | Freq: Three times a day (TID) | ORAL | Status: DC
  Administered 2013-02-25 (×3): 5 mg via ORAL
  Filled 2013-02-25 (×4): qty 1

## 2013-02-25 NOTE — Telephone Encounter (Signed)
Jichelle from [facility] called to find out if patient was taking Sinemet and Guinevere Scarlet, because they were getting ready to discharge the patient.  I spoke to Dr. Vickey Huger and she did not prescribe the [place].  She did prescribe the Sinemet but spoke to the patient's PCP, Dr. Valentina Lucks, and said if it wasn't effective then the patient didn't have to take medication.  I relayed the information to [place].

## 2013-02-25 NOTE — Progress Notes (Signed)
Subjective: Headache yesterday    Objective: Vital signs in last 24 hours: Temp:  [97.4 F (36.3 C)-98.3 F (36.8 C)] 97.7 F (36.5 C) (08/13 0500) Pulse Rate:  [69-96] 73 (08/13 0500) Resp:  [18-20] 18 (08/13 0500) BP: (106-185)/(73-98) 154/90 mmHg (08/13 0559) SpO2:  [96 %-97 %] 97 % (08/13 0500) Weight change:  Last BM Date: 02/22/13  Intake/Output from previous day: 08/12 0701 - 08/13 0700 In: 240 [P.O.:240] Out: 120 [Urine:120] Intake/Output this shift: Total I/O In: -  Out: 120 [Urine:120]  General appearance: alert and cooperative Resp: clear to auscultation bilaterally Cardio: regular rate and rhythm, S1, S2 normal, no murmur, click, rub or gallop  Lab Results:  Recent Labs  02/23/13 1400 02/24/13 0450  WBC 6.3 6.7  HGB 14.6 13.7  HCT 43.0 40.0  PLT 289 252   BMET  Recent Labs  02/23/13 1400 02/24/13 0450  NA 140 141  K 4.0 3.8  CL 106 105  CO2 26 25  GLUCOSE 99 90  BUN 13 11  CREATININE 0.80 0.78  CALCIUM 9.9 9.5    Studies/Results: Dg Chest 2 View  02/23/2013   *RADIOLOGY REPORT*  Clinical Data: Dizziness  CHEST - 2 VIEW  Comparison: July 22, 2012.  Findings: Cardiomediastinal silhouette appears normal.  Elevated right hemidiaphragm is stable.  No acute pulmonary disease is noted.  No pneumothorax or pleural effusion is noted.  Bony thorax is intact.  IMPRESSION: No acute cardiopulmonary abnormality seen.   Original Report Authenticated By: Lupita Raider.,  M.D.   Dg Knee 1-2 Views Left  02/23/2013   *RADIOLOGY REPORT*  Clinical Data: Knee pain, post fall, evaluate for fracture  LEFT KNEE - 1-2 VIEW  Comparison: None.  Findings: No fracture dislocation.  Joint spaces are preserved. Chondrocalcinosis is noted within the medial and lateral joint spaces.  No evidence of suprapatellar joint effusion.  Regional soft tissues are normal.  No radiopaque foreign body.  IMPRESSION: 1.  No fracture or radiopaque foreign body. 2.  Chondrocalcinosis  suggestive of CPPD.   Original Report Authenticated By: Tacey Ruiz, MD   Ct Head Wo Contrast  02/23/2013   *RADIOLOGY REPORT*  Clinical Data: Dizziness  CT HEAD WITHOUT CONTRAST  Technique:  Contiguous axial images were obtained from the base of the skull through the vertex without contrast.  Comparison: 01/22/2011  Findings: Age-related brain atrophy.  No acute intracranial hemorrhage, definite infarction, mass lesion, midline shift, herniation, hydrocephalus, or extra-axial fluid collection.  No focal mass effect or edema.  Normal gray-white matter differentiation.  Cisterns are patent.  No cerebellar abnormality. Orbits are unremarkable.  Mastoids and sinuses clear.  IMPRESSION: Atrophy without acute intracranial process   Original Report Authenticated By: Judie Petit. Miles Costain, M.D.    Medications: I have reviewed the patient's current medications.  Assessment/Plan:  Principal Problem:  Parkinson disease Had fall after hearing loud sound in L ear, no focal neurological signs and not severely orthostatic.PT has evaluated, awaiting NHP for rehab, ready for discharge. Active Problems:  Orthostatic hypotension having some supine hypertension and headache, decrease midodrine to 5 mg tid Atrial fibrillation in NSR on flecainide. Metoprolol was stopped last admission.      LOS: 2 days   [facility] 02/25/2013, 6:46 AM

## 2013-02-25 NOTE — Clinical Social Work Placement (Signed)
Clinical Social Work Department CLINICAL SOCIAL WORK PLACEMENT NOTE 02/25/2013  Patient:  Melinda [name], Melinda [name]  Account Number:  [id number] Admit date:  02/23/2013  Clinical Social Worker:  Cherre Blanc, [place]  Date/time:  02/24/2013 04:30 PM  Clinical Social Work is seeking post-discharge placement for this patient at the following level of care:   SKILLED NURSING   (*CSW will update this form in Epic as items are completed)   02/24/2013  Patient/family provided with [facility] Department of Clinical Social Work's list of facilities offering this level of care within the geographic area requested by the patient (or if unable, by the patient's family).  02/24/2013  Patient/family informed of their freedom to choose among providers that offer the needed level of care, that participate in Medicare, Medicaid or managed care program needed by the patient, have an available bed and are willing to accept the patient.  02/24/2013  Patient/family informed of [facility]' ownership interest in [facility], as well as of the fact that they are under no obligation to receive care at this facility.  PASARR submitted to EDS on 02/24/2013 PASARR number received from EDS on 02/24/2013  FL2 transmitted to all facilities in geographic area requested by pt/family on  02/24/2013 FL2 transmitted to all facilities within larger geographic area on   Patient informed that his/her managed care company has contracts with or will negotiate with  certain facilities, including the following:     Patient/family informed of bed offers received:  02/25/2013 Patient chooses bed at [facility] Physician recommends and patient chooses bed at    Patient to be transferred to [facility] on  02/25/2013 Patient to be transferred to facility by Daughter's personal vehicle  The following physician request were entered in Epic:   Additional Comments: Per  MD patient DC 02/25/13. Patient unhappy with original bed offers, but CSW was able to secure bed with [facility]. Patient DC to [facility] and transported to facility by daughter's personal vehicle. CSW signing off.   Roddie Mc, [place], [place], [phone]

## 2013-02-25 NOTE — Progress Notes (Signed)
Called Md Griffins office number multiple times and was placed on hold for 12 mins without a response. Was wanting to notify Md. Griffins of patient and daughter's questions and desire for neuro consult. Also to make him aware that the patient's headache wasn't relieved by tylenol and need a stronger pain med.

## 2013-02-25 NOTE — Discharge Summary (Signed)
Physician Discharge Summary  Patient ID: Melinda [name] MRN: [id number] DOB/AGE: [birth year] 72 y.o.  Admit date: 02/23/2013 Discharge date: 02/25/2013  Admission Diagnoses: Fall Parkinson's disease Orthostatic hypotension Atrial fibrillation  Discharge Diagnoses:  Principal Problem: Fall   Parkinson disease Active Problems:   Orthostatic hypotension   Atrial fibrillation   Discharged Condition: good  Hospital Course: 72 year old white female who had a fall at home. The patient her loud noise in her left ear which started her, she lost her balance and fell. She was taken to ER and admitted to the hospital. She does have Parkinson's disease and 3 weeks ago had been started on Sinemet but had developed severe orthostatic hypotension and it had to be stopped. She was seen by physical therapy who felt that she has significant balance disturbances and was unsafe to be discharged home alone and rehabilitation was recommended and arranged. The patient did have evidence of supine hypertension during hospitalization and her midodrine dose was decreased. She did not have evidence of significant orthostatic hypotension during this admission. She remained in normal sinus rhythm on flecainide. Her outpatient regimen for treating her depression was not changed.  Consults: None  Significant Diagnostic Studies: labs: Sodium 141, potassium 2.8, chloride 105, BUN 11, creatinine 0.78, CBC WBC 6.7, hemoglobin 13.7, platelet count 252,000 and radiology: CXR: normal, X-Ray: Knee x-ray chondrocalcinosis but no fracture and CT scan: Nonacute  Treatments: analgesia: Oxycodone and therapies: PT  Discharge Exam: Blood pressure 154/90, pulse 73, temperature 97.7 F (36.5 C), temperature source Oral, resp. rate 18, height 5\' 7"  (1.702 m), weight 69.1 kg (152 lb 5.4 oz), SpO2 97.00%. General appearance: alert and cooperative Resp: clear to auscultation bilaterally Cardio: regular rate and rhythm, S1, S2 normal,  no murmur, click, rub or gallop  Disposition: 01-Home or Self Care   Future Appointments Provider Department Dept Phone   02/26/2013 8:00 AM Amy Aleatha Borer, PT [facility] [phone]   02/26/2013 8:45 AM Marlis Edelson, OT [facility] [phone]   03/12/2013 1:00 PM Melvyn Novas, MD [facility] [phone]   05/21/2013 3:00 PM Melvyn Novas, MD [facility] [phone]       Medication List    STOP taking these medications       metoprolol succinate 25 MG 24 hr tablet  Commonly known as:  TOPROL-XL      TAKE these medications       buPROPion 150 MG 24 hr tablet  Commonly known as:  WELLBUTRIN XL  Take 450 mg by mouth every morning.     CALTRATE 600+D PO  Take 1 tablet by mouth daily.     clonazePAM 0.5 MG tablet  Commonly known as:  KLONOPIN  Take 0.5 mg by mouth at bedtime as needed. For sleep     estradiol 2 MG tablet  Commonly known as:  ESTRACE  Take 2 mg by mouth daily.     flecainide 100 MG tablet  Commonly known as:  TAMBOCOR  Take 100 mg by mouth 2 (two) times daily.     fludrocortisone 0.1 MG tablet  Commonly known as:  FLORINEF  Take 0.3 mg by mouth daily.     hydrOXYzine 25 MG capsule  Commonly known as:  VISTARIL  Take 25 mg by mouth at bedtime.     levothyroxine 75 MCG tablet  Commonly known as:  SYNTHROID, LEVOTHROID  Take 75 mcg by mouth daily.     midodrine 5 MG tablet  Commonly known  as:  PROAMATINE  Take 1 tablet (5 mg total) by mouth 3 (three) times daily with meals.     PARoxetine 30 MG tablet  Commonly known as:  PAXIL  Take 60 mg by mouth every morning.     potassium chloride 20 MEQ/15ML (10%) solution  Take 15 mLs (20 mEq total) by mouth daily.     Rivaroxaban 20 MG Tabs tablet  Commonly known as:  XARELTO  Take 1 tablet (20 mg total) by mouth daily with supper.     tetrabenazine 12.5 MG tablet   Commonly known as:  XENAZINE  Take 12.5 mg by mouth 2 (two) times daily.         SignedLillia Mountain 02/25/2013, 6:55 AM

## 2013-02-26 ENCOUNTER — Ambulatory Visit: Payer: Medicare PPO | Attending: Neurology | Admitting: Occupational Therapy

## 2013-02-26 ENCOUNTER — Ambulatory Visit: Payer: Medicare PPO | Admitting: Physical Therapy

## 2013-02-26 NOTE — Telephone Encounter (Signed)
Pt's daughter is calling back has not heard anything, she is livid about having Dr. Vickey Huger give her a call back concerning the discharge and results from the Dr. that took care of her in the hospital. Pt's daughter contact # is [phone]. Thanks

## 2013-02-26 NOTE — Telephone Encounter (Signed)
I spoke to daughter Irving Burton and she was quite concerned about how d/c summary read about why her mother came to the hospital.  She said her mother had this loud noise in her ear, like a train, jolt her and affect her left side.  This continued to affect her left side for about 25 minutes.  Daughter said she also had slurred speech for hours and this is why she went to the hospital.  The patient's CT showed atrophy without acute intracranial process.  Daughter will not be able to come with patient on her 03-12-13 appointment, but will try to have a friend come with her.  If not she would like to be in a conference call during visit.

## 2013-02-27 NOTE — Telephone Encounter (Signed)
I was informed that Mrs. Melinda [name] has been admitted to hospital. That the CT showed normal stroke more etc. I left a voicemail with Mrs. Melinda [name] the patient's daughter explaining that I mummification that I will see the patient on August 28 upon return.

## 2013-03-02 NOTE — Telephone Encounter (Signed)
I spoke with daughter, who would like to do a conference call when patient comes for her appointment on 03-12-13.  Patient is unable to get a friend to come with her and daughter will be out of town.  Also wanted Dr. Vickey Huger to know that patient is in [facility] for rehab after her hospital stay.

## 2013-03-03 DIAGNOSIS — Z0289 Encounter for other administrative examinations: Secondary | ICD-10-CM

## 2013-03-10 ENCOUNTER — Telehealth: Payer: Self-pay | Admitting: Neurology

## 2013-03-11 ENCOUNTER — Encounter: Payer: Self-pay | Admitting: Neurology

## 2013-03-12 ENCOUNTER — Encounter: Payer: Self-pay | Admitting: Neurology

## 2013-03-12 ENCOUNTER — Telehealth: Payer: Self-pay | Admitting: Neurology

## 2013-03-12 ENCOUNTER — Ambulatory Visit (INDEPENDENT_AMBULATORY_CARE_PROVIDER_SITE_OTHER): Payer: 59 | Admitting: Neurology

## 2013-03-12 VITALS — BP 127/87 | HR 75 | Ht 67.0 in | Wt 163.0 lb

## 2013-03-12 DIAGNOSIS — G238 Other specified degenerative diseases of basal ganglia: Secondary | ICD-10-CM

## 2013-03-12 DIAGNOSIS — G909 Disorder of the autonomic nervous system, unspecified: Secondary | ICD-10-CM

## 2013-03-12 DIAGNOSIS — G901 Familial dysautonomia [Riley-Day]: Secondary | ICD-10-CM

## 2013-03-12 DIAGNOSIS — G232 Striatonigral degeneration: Secondary | ICD-10-CM

## 2013-03-12 MED ORDER — PYRIDOSTIGMINE BROMIDE 60 MG PO TABS: 60.0000 mg | ORAL_TABLET | Freq: Two times a day (BID) | ORAL | Status: DC

## 2013-03-12 MED ORDER — TOPIRAMATE 25 MG PO TABS: 25.0000 mg | ORAL_TABLET | Freq: Two times a day (BID) | ORAL | Status: DC

## 2013-03-12 NOTE — Telephone Encounter (Signed)
Per Dr Vickey Huger, a message was left with the social worker at [facility] asking them to keep the patient another week.  The patient should not return home alone, but an assisted living facility is recommended.  I asked her to call back with further questions.

## 2013-03-12 NOTE — Progress Notes (Signed)
[facility]  Provider:  Melvyn Novas, M D  Referring Provider: Lillia Mountain, MD Primary Care Physician:  Lillia Mountain, MD  Chief Complaint  Patient presents with  . Follow-up    hospital stay, rm 10,parkinsonism    HPI:  Melinda [name] is a 72 y.o. female  Is seen here as a referral/ revisit  from Dr. Valentina Lucks after a recent hospitalization, following hearing a " loud noise, Feeling an explosion in the head". Work up for stroke  In the hospital was negative. The nature of her complaint , the cause remains unexplained.    The patient was first  seen on 04-23-2011 when she presented with a Tremor , rigidity, masked face ( less facial expression, and wide open , scared looking eyes) . She had a rare blink and her affect appears somewhat flat.  Noticed was also dysphonia and she endorsed a high level of fatigue. She denied a hallucinations and swallowing difficulties, was able to walk. 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   In early 2014  Changes were clearly seen, She was unable to rise now or from a seated position without bracing herself. Sitting on the exam table ,she was crossing her arms in front of her and  a significant tremor in her right hand was noticed  to a higher amplitude than in the left hand. This asymetry was concerning.  I also noticed in her  visit in  July 2013  , that the cogwheeling over the Biceps and rigidity over the wrists were now strong , and I initiated a trial of  brenztropin . The patient stated in June 2014, that the initial trial on  ARTANE  had made her sick - so she was at the time not feeling impaired enough to use another tremor medication, but now is worsening rapidly.  (Meanwhile they have been some medical developments,  the patient was diagnosed with atrial fibrillation. She remained on metoprolol for rate  control , on flecainide, and on XERALTO for anticoagulation.  Patient and her daughter report both that Mrs. Yarde is easily very fatigued and  frequently lightheaded when standing up  or changing posture.  This proceeded the diagnosis of atrial fibrillation and has not been improved by the treatment of Atrial fibrillation.) There is a strong component of dysautonomia to her complains.   Failed artane - got sick, but developed more tremor. We tried Sinemet tid ,  again she is  subjectively reporting no improvement in tremor or rigidity.  I had ordered  PT for gait stabilisation, OT and ST - she has a high risk ,  a fall tendency that is propulsive and retropulsive.  This patient had more parkinsonian features to her movement disorder , when last seen in June , and today has further progressed. Her astonished , masked facial expression remains. She has full EOM.  There are  bluish discoloured feet and ankles and edema, with normal pulses .  Palmar erythema is mild, but muscle atrophy in fingers , thenar eminence and metacarpal is more pronounced than I recall. Please note, that the patient had not any fall related injuries, but was hospitalized after the " big explosion" sound in her head .    Mrs Hazle Quant daughter, Irving Burton , is via conference call connected to the exam room.     Review of Systems: Out of a  complete 14 system review, the patient complains of only the following symptoms, and all other reviewed systems are negative.   today endorsed neck stiffness- fatigue, tremor, unsteady gait and dizziness when changing posture , but at times while already walking for a while.  No fever, nausea, dyphagia, pain.  History   Social History  . Marital Status: Divorced    Spouse Name: N/A    Number of Children: 2  . Years of Education: Masters   Occupational History  . retired    Social History Main Topics  . Smoking status: Former Smoker -- 0.50 packs/day for 20 years    Types: Cigarettes     Quit date: 07/23/1992  . Smokeless tobacco: Never Used     Comment: quit 15 years ago  . Alcohol Use: No  . Drug Use: No  . Sexual Activity: Not Currently   Other Topics Concern  . Not on file   Social History Narrative   Patient lives at home alone . Patient is single. Patient is retired.   Right handed.   College- masters.   Caffeine - two cups daily.    Family History  Problem Relation Age of Onset  . Migraines Mother   . Anxiety disorder Son   . Migraines Daughter   . Anxiety disorder Daughter     Past Medical History  Diagnosis Date  . Depression   . Anxiety   . Thyroid disease   . Hearing loss   . PONV (postoperative nausea and vomiting)   . Arthritis   . Hypothyroidism   . Tremors of nervous system   . Dysrhythmia     brief rapid afib 01/2012;   john griffin  md  . Orthostatic hypotension   . Sinusitis   . Cancer     uterus removed in 1974  . 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 Back surgery    . Abdominal hysterectomy    . Breast enhancement surgery    . Hammer toe surgery    . Inguinal hernia repair  06/27/2012    Procedure: HERNIA REPAIR INGUINAL ADULT;  Surgeon: Shelly Rubenstein, MD;  Location: [facility] OR;  Service: General;  Laterality: Right;  . Insertion of mesh  06/27/2012    Procedure: INSERTION OF MESH;  Surgeon: Shelly Rubenstein, MD;  Location: [facility] OR;  Service: General;  Laterality: Right;  . Hernia repair    . Inguinal hernia repair Right 11/17/2012    Procedure: REPAIR RIGHT RECURRENT INGUINAL HERNIA;  Surgeon: Shelly Rubenstein, MD;  Location: [facility] OR;  Service: General;  Laterality: Right;  . Insertion of mesh Right 11/17/2012    Procedure: INSERTION OF MESH;  Surgeon: Shelly Rubenstein, MD;  Location: [facility] OR;  Service: General;  Laterality: Right;    Current Outpatient Prescriptions  Medication Sig Dispense Refill  . aspirin 81 MG tablet Take 81 mg by mouth daily.      Marland Kitchen  buPROPion (WELLBUTRIN XL) 150 MG 24 hr tablet Take 450 mg by mouth every morning.       . Calcium Carbonate-Vitamin D (CALTRATE 600+D PO) Take 1 tablet by mouth daily.      . clonazePAM (KLONOPIN) 0.5 MG tablet Take 0.5 mg by mouth at bedtime as needed. For sleep      . estradiol (ESTRACE) 2 MG tablet Take 2 mg by mouth daily.      . flecainide (TAMBOCOR) 100 MG  tablet Take 100 mg by mouth 2 (two) times daily.      . fludrocortisone (FLORINEF) 0.1 MG tablet Take 0.3 mg by mouth daily.      . hydrOXYzine (VISTARIL) 25 MG capsule Take 25 mg by mouth at bedtime.       Marland Kitchen levothyroxine (SYNTHROID, LEVOTHROID) 75 MCG tablet Take 75 mcg by mouth daily.      . midodrine (PROAMATINE) 5 MG tablet Take 1 tablet (5 mg total) by mouth 3 (three) times daily with meals.  90 tablet  11  . PARoxetine (PAXIL) 30 MG tablet Take 60 mg by mouth every morning.      . potassium chloride 20 MEQ/15ML (10%) solution Take 15 mLs (20 mEq total) by mouth daily.  500 mL  0  . Rivaroxaban (XARELTO) 20 MG TABS Take 1 tablet (20 mg total) by mouth daily with supper.  30 tablet  11  . tetrabenazine (XENAZINE) 12.5 MG tablet Take 12.5 mg by mouth 2 (two) times daily.       No current facility-administered medications for this visit.    Allergies as of 03/12/2013 - Review Complete 03/12/2013  Allergen Reaction Noted  . Other Anaphylaxis 01/31/2012  . Pollen extract  03/11/2013  . Ace inhibitors  01/29/2013  . Sinemet [carbidopa-levodopa]  03/12/2013    Vitals: BP 127/87  Pulse 75  Ht 5\' 7"  (1.702 m)  Wt 163 lb (73.936 kg)  BMI 25.52 kg/m2 Last Weight:  Wt Readings from Last 1 Encounters:  03/12/13 163 lb (73.936 kg)   Last Height:   Ht Readings from Last 1 Encounters:  03/12/13 5\' 7"  (1.702 m)    Physical exam:  General: The patient is awake, alert and appears not in acute distress. The patient is well groomed. Head: Normocephalic, atraumatic.- masked face  Neck is rigid - Cardiovascular:  Regular  Rhythm, 78  bpm , without  murmurs or carotid bruit, and without distended neck veins. Respiratory: Lungs are clear to auscultation. Skin:   Edema and discoloured skin , both feet are bluish. palmar mild erythema  Trunk: hunched posture.   Neurologic exam : The patient is awake and alert, oriented to place and time.  Memory subjective  described as intact. There is a normal attention span & concentration ability. Speech is fluent without dysarthria,  Mild dysphonia , not aphasia. Mood and affect are appropriate. GDS score was 6 points.   Cranial nerves: Pupils are equal and briskly reactive to light. Funduscopic exam without  evidence of pallor or edema.  Rare  Blink . Mimic and blink frequency are reduced she also has right sided  hearing loss.  Her speech always  is dysphonic Extraocular movements  in vertical and horizontal planes not smooth , but able to direct gaze  without nystagmus.  Visual fields by finger perimetry are intact. Hearing to finger rub intact on the left .  Facial sensation intact to fine touch. Facial motor strength is symmetric and tongue and uvula move midline. No jaw tremor.  Motor exam:   The patient provides good strength in all 4 extremities there is a minor reduction of range of motion at the shoulder level, her neck appears stiff. Mimic and blink frequency are reduced she also has rather severe hearing loss. Her speech always  is dysphonic. tremor is right dominant.  Sensory:  Fine touch, pinprick and vibration were normal.   Coordination: Rapid alternating movements in the fingers/hands were tested and found to be slow . The patient  provided strong grip, in spite of some  atrophic hand muscles. There were no fasciculations noted.   Finger-to-nose maneuver tested and normal without evidence of ataxia, dysmetria , and mild  Tremor today (1).  Gait and station: Patient walks with assistance , she sat in a wheelchair coming to the exam room. My partner , Dr. Elspeth Cho , gave  additional instructions for the patient to remain standing while she was pushed and pulled , the patient was able to resist and remain standing for the initial pull, but was unstable when pushed from the back. Her body appeared rigid , unable to provide the self- righting reflexes.    Strength within normal limits. Decreased arm swing but no pil- rolling tremor.  Stance ( open eyed)  is stable and normal. Tandem gait not possible.   Deep tendon reflexes: in the  upper and lower extremities are symmetric, BRISK,  Babinski maneuver response is  downgoing.   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 The astonished facial expression and the skin discoloration are present, but not the restriction in gaze as seen in PSP. There is no evidence of dementia, but testing will be needed in future neurologic evaluations. These conditions require symptomatic treatment, and do not respond well to classic Parkinson's  disease medications. Use of tricyclic medications, cholinergica  ( Mestinon ) and off label use of anticonvulsants  for tremor control and reduction in rigor .   Mrs. Hazle Quant daughter wants her mother to move closer to her own [place] area  home and I will contact the movement disorder specialists at [facility] to provide follow up care there.  The patient suffers from a progressive neurodegenerative disease and may need escalating care  In the future, I explained to Mrs Dorothyann Gibbs , that it would be beneficial  for her to find an assisted living arrangement  within a facility that can advance to more skilled care as needed.  Her fall risk is high due to both :  the lack of righting reflexes and the hypotensive episodes.    PLAN: I added Mestinon to the 2 medications that treat Mrs Hazle Quant blood pressure  Hypotonia, dysautonomia; florinef  and midodrine.  I felt less rigor was present and less tremor , than in June when she  Had not yet been taking SINEMET, but the autonomic problems were probably worsening under this medication. The patient felt subjectivly nt improvement and her aid was not able to give any information. The patient still resides at  [facility] , and I would be in favour of leaving her in care for at least another week .  I added topiramate for tremor control , which sometimes helps  Parkinson Plus patients unable to tolerate dopaminergic medication.  I strongly urged her to keep up with  Oral hydration and  To wear compression stockings to help with hypotension.    I have spend 60 minutes with this patient and 30 minutes were related to disease information, treatment options and suspected course the disorder is taking. I spoke to Mrs Hazle Quant daughters while the patient was in the exam room, than perofrmed a detailed examination  with Dr Hosie Poisson assistance  , and spoke again to the patient, her  Aid from the nursing home , the  patient's daughters after the MSA diagnoses was established as most likely.  I  discussed prognosis and disease character with the daughter's  again on the phone .

## 2013-03-12 NOTE — Patient Instructions (Signed)
  Multi system atrophy  Fall Prevention and Home Safety Falls cause injuries and can affect all age groups. It is possible to prevent falls.  HOW TO PREVENT FALLS  Wear shoes with rubber soles that do not have an opening for your toes.  Keep the inside and outside of your house well lit.  Use night lights throughout your home.  Remove clutter from floors.  Clean up floor spills.  Remove throw rugs or fasten them to the floor with carpet tape.  Do not place electrical cords across pathways.  Put grab bars by your tub, shower, and toilet. Do not use towel bars as grab bars.  Put handrails on both sides of the stairway. Fix loose handrails.  Do not climb on stools or stepladders, if possible.  Do not wax your floors.  Repair uneven or unsafe sidewalks, walkways, or stairs.  Keep items you use a lot within reach.  Be aware of pets.  Keep emergency numbers next to the telephone.  Put smoke detectors in your home and near bedrooms. Ask your doctor what other things you can do to prevent falls. Document Released: 04/28/2009 Document Revised: 01/01/2012 Document Reviewed: 10/02/2011 ExitCare Patient Information 2014 [place].  atrophy  Patient needs assistant living or regular at home daily visit. Condition is progressive.

## 2013-03-15 ENCOUNTER — Encounter: Payer: Self-pay | Admitting: Neurology

## 2013-03-15 DIAGNOSIS — G232 Striatonigral degeneration: Secondary | ICD-10-CM

## 2013-03-15 HISTORY — DX: Striatonigral degeneration: G23.2

## 2013-03-17 ENCOUNTER — Telehealth: Payer: Self-pay | Admitting: Neurology

## 2013-03-17 NOTE — Telephone Encounter (Signed)
Patient's daughter is calling to get a referral to Dr. Raquel Sarna at [facility].  Her number is [phone] and fax is [phone].  Please advise and I will be glad to put in referral.

## 2013-03-18 ENCOUNTER — Other Ambulatory Visit: Payer: Self-pay | Admitting: Neurology

## 2013-03-18 DIAGNOSIS — G909 Disorder of the autonomic nervous system, unspecified: Secondary | ICD-10-CM

## 2013-03-18 DIAGNOSIS — G238 Other specified degenerative diseases of basal ganglia: Secondary | ICD-10-CM

## 2013-03-18 NOTE — Telephone Encounter (Signed)
Referral made to [facility], Dr. Raquel Sarna, per Dr. Vickey Huger.  I left message for daughter that they will be contacted with an appointment by [facility].

## 2013-03-20 ENCOUNTER — Telehealth: Payer: Self-pay | Admitting: Neurology

## 2013-03-20 NOTE — Telephone Encounter (Signed)
[employer]. No answer.

## 2013-04-03 ENCOUNTER — Telehealth: Payer: Self-pay | Admitting: Neurology

## 2013-04-03 ENCOUNTER — Other Ambulatory Visit: Payer: Self-pay | Admitting: Neurology

## 2013-04-03 DIAGNOSIS — G901 Familial dysautonomia [Riley-Day]: Secondary | ICD-10-CM

## 2013-04-03 DIAGNOSIS — G232 Striatonigral degeneration: Secondary | ICD-10-CM

## 2013-04-03 DIAGNOSIS — G252 Other specified forms of tremor: Secondary | ICD-10-CM

## 2013-04-03 NOTE — Telephone Encounter (Signed)
Melinda [name] is currently taking 25 mg of Topamax for tremors. She doesn't feel that it is helping, therefore would like to know if this could be increased? Please advise.

## 2013-04-06 MED ORDER — TOPIRAMATE 25 MG PO TABS: 50.0000 mg | ORAL_TABLET | Freq: Two times a day (BID) | ORAL | Status: DC

## 2013-04-06 NOTE — Telephone Encounter (Signed)
Yes, we can increase the topiramate to 25 mg bid .  She needs to hydrate well , while taking this medication.  She is not living at home right now , and her daughter is main caretaker to call- number has been listed under one of donna's notes. Marland Kitchen

## 2013-04-08 ENCOUNTER — Other Ambulatory Visit: Payer: Self-pay

## 2013-04-08 DIAGNOSIS — G232 Striatonigral degeneration: Secondary | ICD-10-CM

## 2013-04-08 DIAGNOSIS — G901 Familial dysautonomia [Riley-Day]: Secondary | ICD-10-CM

## 2013-04-08 DIAGNOSIS — G252 Other specified forms of tremor: Secondary | ICD-10-CM

## 2013-04-08 MED ORDER — TOPIRAMATE 25 MG PO TABS: 50.0000 mg | ORAL_TABLET | Freq: Two times a day (BID) | ORAL | Status: DC

## 2013-04-14 NOTE — Telephone Encounter (Signed)
Spoke to patient and she was told to increase her Topamax to 50mg  bid.

## 2013-04-28 ENCOUNTER — Telehealth: Payer: Self-pay | Admitting: Neurology

## 2013-04-28 NOTE — Telephone Encounter (Signed)
i am so happy for her!

## 2013-05-05 ENCOUNTER — Telehealth: Payer: Self-pay | Admitting: Neurology

## 2013-05-05 DIAGNOSIS — G901 Familial dysautonomia [Riley-Day]: Secondary | ICD-10-CM

## 2013-05-05 DIAGNOSIS — G232 Striatonigral degeneration: Secondary | ICD-10-CM

## 2013-05-05 NOTE — Telephone Encounter (Signed)
Patient is asking if she can start taking Mestinon again, patient has MSA, morning BP 60, evening BP is 190, patient stopped taking it and wants to know is she should start taking it again.  Please advise.  [phone]  This message was originally attached to a message from 04-28-13, but needed to be a new phone message.

## 2013-05-07 NOTE — Telephone Encounter (Signed)
Yes , she make take mestinon early AM po. Thank you . Please call.

## 2013-05-11 ENCOUNTER — Other Ambulatory Visit: Payer: Self-pay | Admitting: Cardiology

## 2013-05-11 MED ORDER — PYRIDOSTIGMINE BROMIDE 60 MG PO TABS: 60.0000 mg | ORAL_TABLET | Freq: Two times a day (BID) | ORAL | Status: DC

## 2013-05-11 NOTE — Telephone Encounter (Signed)
Wrote a refill . Faxed to [facility],

## 2013-05-11 NOTE — Telephone Encounter (Signed)
Spoke with patient and she does not have any medication, said she received it from the ER.  The one listed in her chart is for twice a day.  Can a new rx be written for Mestinon and sent to [facility] on [address].

## 2013-05-13 ENCOUNTER — Telehealth: Payer: Self-pay

## 2013-05-15 MED ORDER — FLECAINIDE ACETATE 100 MG PO TABS: 100.0000 mg | ORAL_TABLET | Freq: Two times a day (BID) | ORAL | Status: DC

## 2013-05-15 NOTE — Telephone Encounter (Signed)
Rx filled

## 2013-05-18 ENCOUNTER — Telehealth: Payer: Self-pay | Admitting: Neurology

## 2013-05-18 NOTE — Telephone Encounter (Signed)
called patient to reschedule patient per dohmeier  for 11/6 to 11/4 couldn,t reach pt left message.

## 2013-05-19 ENCOUNTER — Encounter: Payer: Self-pay | Admitting: Cardiology

## 2013-05-19 DIAGNOSIS — I499 Cardiac arrhythmia, unspecified: Secondary | ICD-10-CM | POA: Insufficient documentation

## 2013-05-19 DIAGNOSIS — E079 Disorder of thyroid, unspecified: Secondary | ICD-10-CM | POA: Insufficient documentation

## 2013-05-19 DIAGNOSIS — C801 Malignant (primary) neoplasm, unspecified: Secondary | ICD-10-CM | POA: Insufficient documentation

## 2013-05-19 DIAGNOSIS — J329 Chronic sinusitis, unspecified: Secondary | ICD-10-CM | POA: Insufficient documentation

## 2013-05-19 DIAGNOSIS — Z87898 Personal history of other specified conditions: Secondary | ICD-10-CM | POA: Insufficient documentation

## 2013-05-19 DIAGNOSIS — I4891 Unspecified atrial fibrillation: Secondary | ICD-10-CM | POA: Insufficient documentation

## 2013-05-19 DIAGNOSIS — J309 Allergic rhinitis, unspecified: Secondary | ICD-10-CM | POA: Insufficient documentation

## 2013-05-19 DIAGNOSIS — M8580 Other specified disorders of bone density and structure, unspecified site: Secondary | ICD-10-CM | POA: Insufficient documentation

## 2013-05-21 ENCOUNTER — Encounter (INDEPENDENT_AMBULATORY_CARE_PROVIDER_SITE_OTHER): Payer: Self-pay

## 2013-05-21 ENCOUNTER — Ambulatory Visit: Payer: 59 | Admitting: Neurology

## 2013-05-21 ENCOUNTER — Encounter: Payer: Self-pay | Admitting: Cardiology

## 2013-05-21 ENCOUNTER — Ambulatory Visit (INDEPENDENT_AMBULATORY_CARE_PROVIDER_SITE_OTHER): Payer: Medicare PPO | Admitting: Cardiology

## 2013-05-21 ENCOUNTER — Other Ambulatory Visit: Payer: Self-pay | Admitting: Cardiology

## 2013-05-21 VITALS — BP 146/88 | HR 71 | Ht 67.0 in | Wt 173.4 lb

## 2013-05-21 DIAGNOSIS — F419 Anxiety disorder, unspecified: Secondary | ICD-10-CM

## 2013-05-21 DIAGNOSIS — I951 Orthostatic hypotension: Secondary | ICD-10-CM

## 2013-05-21 DIAGNOSIS — I4891 Unspecified atrial fibrillation: Secondary | ICD-10-CM

## 2013-05-21 DIAGNOSIS — G20 Parkinson's disease: Secondary | ICD-10-CM

## 2013-05-21 DIAGNOSIS — F411 Generalized anxiety disorder: Secondary | ICD-10-CM

## 2013-05-21 LAB — CBC WITH DIFFERENTIAL/PLATELET
Basophils Relative: 1.1 % (ref 0.0–3.0)
Eosinophils Relative: 9.4 % — ABNORMAL HIGH (ref 0.0–5.0)
Hemoglobin: 13.8 g/dL (ref 12.0–15.0)
Lymphocytes Relative: 26.5 % (ref 12.0–46.0)
MCHC: 33.8 g/dL (ref 30.0–36.0)
Monocytes Relative: 11.5 % (ref 3.0–12.0)
Neutro Abs: 4.1 10*3/uL (ref 1.4–7.7)
Neutrophils Relative %: 51.5 % (ref 43.0–77.0)
RBC: 4.26 Mil/uL (ref 3.87–5.11)
WBC: 8 10*3/uL (ref 4.5–10.5)

## 2013-05-21 MED ORDER — FLECAINIDE ACETATE 100 MG PO TABS: 100.0000 mg | ORAL_TABLET | Freq: Two times a day (BID) | ORAL | Status: DC

## 2013-05-21 NOTE — Patient Instructions (Signed)
Your physician recommends that you have labs today: CBC w/Diff  Your physician wants you to follow-up in: 6 months with Dr. Skains. You will receive a reminder letter in the mail two months in advance. If you don't receive a letter, please call our office to schedule the follow-up appointment.  

## 2013-05-21 NOTE — Progress Notes (Signed)
Left message for the patient to call back.

## 2013-05-21 NOTE — Progress Notes (Signed)
[address] [place], [place]  [address] Phone: [phone] Fax:  [phone]  Date:  05/21/2013   ID:  Melinda [name], DOB Mar 31, 1941, MRN [id number]  PCP:  Lillia Mountain, MD   History of Present Illness: Melinda [name] is a 72 y.o. female with previous atrial fibrillation with rapid ventricular response on antiarrhythmic with difficulty with orthostatic hypotension on midodrine.she had spent 3 weeks at [place] [facility] working on her gait. She has diffuse muscular atrophy with Parkinson's. She has been on anticoagulation with Xarelto.  She was diagnosed with Parkinson's at [facility]. Overall she is doing fairly well. She has not experienced any other episodes of atrial fibrillation with rapid ventricular response. She is compliant with her medications. No bleeding from anticoagulation.   Wt Readings from Last 3 Encounters:  05/21/13 173 lb 6.4 oz (78.654 kg)  03/12/13 163 lb (73.936 kg)  03/11/13 167 lb (75.751 kg)     Past Medical History  Diagnosis Date  . Depression   . Anxiety   . Thyroid disease   . Hearing loss   . PONV (postoperative nausea and vomiting)   . Arthritis   . Hypothyroidism   . Tremors of nervous system   . Dysrhythmia     brief rapid afib 01/2012;   john griffin  md  . Orthostatic hypotension   . Sinusitis   . Cancer     uterus removed in 1974  . 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 Osteopenia   . Allergic rhinitis   . History of edema   . Acute low back pain with possible spinal stenosis of less than six weeks' duration   . History of diarrhea   . Cervical intraepithelial neoplasia   . Ovarian cyst   . Bilateral hearing loss   . Rib fractures   . Metatarsalgia of right foot   . Helicobacter pylori gastritis   . Atrial fibrillation with rapid ventricular response     Past Surgical History  Procedure Laterality Date  . Back  surgery    . Abdominal hysterectomy    . Breast enhancement surgery    . Hammer toe surgery    . Inguinal hernia repair  06/27/2012    Procedure: HERNIA REPAIR INGUINAL ADULT;  Surgeon: Shelly Rubenstein, MD;  Location: [facility] OR;  Service: General;  Laterality: Right;  . Insertion of mesh  06/27/2012    Procedure: INSERTION OF MESH;  Surgeon: Shelly Rubenstein, MD;  Location: [facility] OR;  Service: General;  Laterality: Right;  . Hernia repair    . Inguinal hernia repair Right 11/17/2012    Procedure: REPAIR RIGHT RECURRENT INGUINAL HERNIA;  Surgeon: Shelly Rubenstein, MD;  Location: [facility] OR;  Service: General;  Laterality: Right;  . Insertion of mesh Right 11/17/2012    Procedure: INSERTION OF MESH;  Surgeon: Shelly Rubenstein, MD;  Location: [facility] OR;  Service: General;  Laterality: Right;  . Tonsillectomy    . Foot surgery Left   . Ganglion cyst excision    . Breast enhancement surgery    . Toe surgery Right   . Rih      Current Outpatient Prescriptions  Medication Sig Dispense Refill  . aspirin 81 MG tablet Take 81 mg by mouth daily.      Marland Kitchen buPROPion (WELLBUTRIN XL) 150 MG 24 hr tablet Take 450 mg by mouth every morning.       Marland Kitchen  Calcium Carbonate-Vitamin D (CALTRATE 600+D PO) Take 1 tablet by mouth daily.      . calcium citrate-vitamin D (CITRACAL+D) 315-200 MG-UNIT per tablet Take 1 tablet by mouth 2 (two) times daily.      . clonazePAM (KLONOPIN) 0.5 MG tablet Take 0.5 mg by mouth at bedtime as needed. For sleep      . estradiol (ESTRACE) 2 MG tablet Take 2 mg by mouth daily.      . flecainide (TAMBOCOR) 100 MG tablet Take 1 tablet (100 mg total) by mouth 2 (two) times daily.  60 tablet  6  . fludrocortisone (FLORINEF) 0.1 MG tablet Take 0.3 mg by mouth daily.      . hydrOXYzine (VISTARIL) 25 MG capsule Take 25 mg by mouth at bedtime.       Marland Kitchen levothyroxine (SYNTHROID, LEVOTHROID) 75 MCG tablet Take 75 mcg by mouth daily.      . metoprolol succinate (TOPROL-XL) 25 MG 24 hr tablet Take 25 mg  by mouth daily.      . midodrine (PROAMATINE) 5 MG tablet Take 1 tablet (5 mg total) by mouth 3 (three) times daily with meals.  90 tablet  11  . PARoxetine (PAXIL) 30 MG tablet Take 60 mg by mouth every morning.      . potassium chloride 20 MEQ/15ML (10%) solution As directed      . Rivaroxaban (XARELTO) 20 MG TABS Take 1 tablet (20 mg total) by mouth daily with supper.  30 tablet  11  . topiramate (TOPAMAX) 25 MG tablet 25 mg 2 (two) times daily. 2 tablets twice daily.       No current facility-administered medications for this visit.    Allergies:    Allergies  Allergen Reactions  . 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 Pollen Extract   . Ace Inhibitors   . Sinemet [Carbidopa-Levodopa]     Hypotension     Social History:  The patient  reports that she quit smoking about 20 years ago. Her smoking use included Cigarettes. She has a 10 pack-year smoking history. She has never used smokeless tobacco. She reports that she does not drink alcohol or use illicit drugs.   ROS:  Please see the history of present illness.   Denies any orthopnea, syncope, fever, chills, bleeding    PHYSICAL EXAM: VS:  BP 146/88  Pulse 71  Ht 5\' 7"  (1.702 m)  Wt 173 lb 6.4 oz (78.654 kg)  BMI 27.15 kg/m2 Well nourished, well developed, in no acute distress, anxious, tremulous HEENT: normal Neck: no JVD Cardiac:  normal S1, S2; RRR; no murmur Lungs:  clear to auscultation bilaterally, no wheezing, rhonchi or rales Abd: soft, nontender, no hepatomegaly Ext: no edema Skin: warm and dry Neuro: no focal abnormalities noted  EKG:  None today ETT: 08/04/12-no ischemia  ASSESSMENT AND PLAN:  1. Atrial fibrillation-currently well controlled. Antiarrhythmic.I do not see an echocardiogram in history. Doing well clinically. Will monitor. Previously, echocardiograms were canceled. 2. Orthostatic hypotension-we've had difficulty with this in the past. Dr. Valentina Lucks also managing. Adequate  hydration. 3. Parkinson's-likely playing a role in autonomic dysfunction as well. Seen by neurology. 4. Anxiety-also potentiating symptoms at times. 5. Chronic anticoagulation-we will check CBC. Recent creatinine on 03/31/13 by Dr. Valentina Lucks was 1.14. We will see her back in 6 months.it should be monitored every 6 months.  Signed, Donato Schultz, MD [facility]  05/21/2013 10:06 AM

## 2013-05-22 ENCOUNTER — Telehealth: Payer: Self-pay | Admitting: Cardiology

## 2013-05-22 NOTE — Telephone Encounter (Signed)
Follow Up  Pt returned call for resuslts

## 2013-05-25 NOTE — Telephone Encounter (Signed)
Spoke with patient; advised of lab results. 

## 2013-05-28 ENCOUNTER — Encounter: Payer: Self-pay | Admitting: Neurology

## 2013-05-28 ENCOUNTER — Ambulatory Visit (INDEPENDENT_AMBULATORY_CARE_PROVIDER_SITE_OTHER): Payer: 59 | Admitting: Neurology

## 2013-05-28 VITALS — BP 157/91 | HR 68 | Ht 67.0 in | Wt 175.0 lb

## 2013-05-28 DIAGNOSIS — G238 Other specified degenerative diseases of basal ganglia: Secondary | ICD-10-CM

## 2013-05-28 MED ORDER — TOPIRAMATE 25 MG PO TABS: 25.0000 mg | ORAL_TABLET | Freq: Two times a day (BID) | ORAL | Status: DC

## 2013-05-28 NOTE — Progress Notes (Signed)
[facility]  Provider:  Marquist Binstock, M D  Referring Provider: Griffin, John Joseph, MD Primary Care Physician:  GRIFFIN,JOHN JOSEPH, MD  Chief Complaint  Patient presents with  . Tremors    4 MO F/U    HPI:  Melinda [name] is a 71 y.o. female  Is seen here as a referral/ revisit  from Dr. Griffin after a recent second opinion visit at [facility] , Dr. Brown.  The patient was first seen on 04-23-2011 when she presented with a Tremor , rigidity, masked face ( less facial expression, and wide open , scared looking eyes) . She had a rare blink and her affect appears somewhat flat.  Noticed was also dysphonia and she endorsed a high level of fatigue. She denied a hallucinations and swallowing difficulties, was able to walk. 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   In early 2014  additional changes were clearly seen, She was unable to rise now or from a seated position without bracing herself.  Sitting on the exam table ,she was crossing her arms in front of her and  a significant tremor in her right hand was noticed  to a higher amplitude than in the left hand. This asymetry was concerning.  I also noticed in her visit in July 2013 , that the cogwheeling over the Biceps and rigidity over the wrists were now strong , and I initiated a trial of brenztropin.  The patient stated in June 2014, that the initial trial on  ARTANE  had made her sick - so she was at the time not feeling impaired enough to use another tremor medication, but now is worsening rapidly.  (Meanwhile they have been some medical developments,  the patient was diagnosed with atrial fibrillation. She remained on metoprolol for rate control , on flecainide, and on XERALTO for anticoagulation.  Patient and her daughter report both that Melinda [name] is easily very fatigued and  frequently  lightheaded when standing up  or changing posture.  This proceeded the diagnosis of atrial fibrillation and has not been improved by the treatment of Atrial fibrillation.) There is a strong component of dysautonomia to her complains.   Failed artane - got sick, but developed more tremor.  We tried Sinemet tid , again she was subjectively reporting no improvement in tremor or rigidity.  I had ordered PT for gait stabilisation, OT and ST - she has a high risk- fall tendency  is propulsive and retropulsive.  This patient had more parkinsonian features to her movement disorder , when last seen in June , and today has further progressed. Her astonished , masked facial expression remains. She has full EOM.  There are  bluish discoloured feet and ankles and edema, with normal pulses .  Palmar erythema is mild, but muscle atrophy in fingers , thenar eminence and metacarpal is more pronounced than I recall.  Patient was started on Topamax bid, increased recently to 50 mg bid, and had a good resolution of tremor. She is happier, and was impressed with Dr .Brauner at [facility]. She will continue with PT , and a walker.       Review of Systems: Out of a complete 14 system review, the patient complains of only the following symptoms, and all other reviewed systems are negative.   today endorsed neck stiffness- fatigue, tremor, unsteady gait and dizziness   when changing posture , but at times while already walking for a while.  No fever, nausea, dyphagia, pain. Nausea temporarily on Mestinon , which she discontinued.   History   Social History  . Marital Status: Divorced    Spouse Name: N/A    Number of Children: 2  . Years of Education: Masters   Occupational History  . retired    Social History Main Topics  . Smoking status: Former Smoker -- 0.50 packs/day for 20 years    Types: Cigarettes    Quit date: 07/23/1992  . Smokeless tobacco: Never Used     Comment: quit 15 years ago  .  Alcohol Use: No  . Drug Use: No  . Sexual Activity: Not Currently   Other Topics Concern  . Not on file   Social History Narrative   Patient lives at home alone . Patient is single. Patient is retired.   Right handed.   College- masters.   Caffeine - two cups daily.    Family History  Problem Relation Age of Onset  . Migraines Mother   . Anxiety disorder Son   . Migraines Daughter   . Anxiety disorder Daughter     Past Medical History  Diagnosis Date  . Depression   . Anxiety   . Thyroid disease   . Hearing loss   . PONV (postoperative nausea and vomiting)   . Arthritis   . Hypothyroidism   . Tremors of nervous system   . Dysrhythmia     brief rapid afib 01/2012;   john griffin  md  . Orthostatic hypotension   . Sinusitis   . Cancer     uterus removed in 1974  . 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 Osteopenia   . Allergic rhinitis   . History of edema   . Acute low back pain with possible spinal stenosis of less than six weeks' duration   . History of diarrhea   . Cervical intraepithelial neoplasia   . Ovarian cyst   . Bilateral hearing loss   . Rib fractures   . Metatarsalgia of right foot   . Helicobacter pylori gastritis   . Atrial fibrillation with rapid ventricular response   . Multiple system atrophy with bradykinesia     Past Surgical History  Procedure Laterality Date  . Back surgery    . Abdominal hysterectomy    . Breast enhancement surgery    . Hammer toe surgery    . Inguinal hernia repair  06/27/2012    Procedure: HERNIA REPAIR INGUINAL ADULT;  Surgeon: Douglas A Blackman, MD;  Location: [facility] OR;  Service: General;  Laterality: Right;  . Insertion of mesh  06/27/2012    Procedure: INSERTION OF MESH;  Surgeon: Douglas A Blackman, MD;  Location: [facility] OR;  Service: General;  Laterality: Right;  . Hernia repair    . Inguinal hernia repair Right 11/17/2012    Procedure: REPAIR  RIGHT RECURRENT INGUINAL HERNIA;  Surgeon: Douglas A Blackman, MD;  Location: [facility] OR;  Service: General;  Laterality: Right;  . Insertion of mesh Right 11/17/2012    Procedure: INSERTION OF MESH;  Surgeon: Douglas A Blackman, MD;  Location: [facility] OR;  Service: General;  Laterality: Right;  . Tonsillectomy    . Foot surgery Left   . Ganglion cyst excision    . Breast enhancement surgery    . Toe surgery Right   . Rih        Current Outpatient Prescriptions  Medication Sig Dispense Refill  . buPROPion (WELLBUTRIN XL) 150 MG 24 hr tablet Take 450 mg by mouth every morning.       . Calcium Carbonate-Vitamin D (CALTRATE 600+D PO) Take 1 tablet by mouth daily.      . calcium citrate-vitamin D (CITRACAL+D) 315-200 MG-UNIT per tablet Take 1 tablet by mouth 2 (two) times daily.      . clonazePAM (KLONOPIN) 0.5 MG tablet Take 0.5 mg by mouth at bedtime as needed. For sleep      . estradiol (ESTRACE) 2 MG tablet Take 2 mg by mouth daily.      . flecainide (TAMBOCOR) 100 MG tablet Take 1 tablet (100 mg total) by mouth 2 (two) times daily.  60 tablet  6  . fludrocortisone (FLORINEF) 0.1 MG tablet Take 0.3 mg by mouth daily.      . hydrOXYzine (VISTARIL) 25 MG capsule Take 25 mg by mouth at bedtime.       . levothyroxine (SYNTHROID, LEVOTHROID) 75 MCG tablet Take 75 mcg by mouth daily.      . metoprolol succinate (TOPROL-XL) 25 MG 24 hr tablet Take 25 mg by mouth daily.      . midodrine (PROAMATINE) 5 MG tablet Take 1 tablet (5 mg total) by mouth 3 (three) times daily with meals.  90 tablet  11  . PARoxetine (PAXIL) 30 MG tablet Take 60 mg by mouth every morning.      . potassium chloride 20 MEQ/15ML (10%) solution As directed      . Rivaroxaban (XARELTO) 20 MG TABS Take 1 tablet (20 mg total) by mouth daily with supper.  30 tablet  11  . topiramate (TOPAMAX) 25 MG tablet Take 1 tablet (25 mg total) by mouth 2 (two) times daily. 2 tablets twice daily.  120 tablet  5   No current facility-administered  medications for this visit.    Allergies as of 05/28/2013 - Review Complete 05/28/2013  Allergen Reaction Noted  . Other Anaphylaxis 01/31/2012  . Pollen extract  03/11/2013  . Ace inhibitors  01/29/2013  . Sinemet [carbidopa-levodopa]  03/12/2013    Vitals: BP 157/91  Pulse 68  Ht 5' 7" (1.702 m)  Wt 175 lb (79.379 kg)  BMI 27.40 kg/m2 Last Weight:  Wt Readings from Last 1 Encounters:  05/28/13 175 lb (79.379 kg)   Last Height:   Ht Readings from Last 1 Encounters:  05/28/13 5' 7" (1.702 m)    Physical exam:  General: The patient is awake, alert and appears not in acute distress. The patient is well groomed. Head: Normocephalic, atraumatic.- masked face.  Neck is rigid - Cardiovascular:  Regular  Rhythm, 78 bpm , without  murmurs or carotid bruit, and without distended neck veins. Respiratory: Lungs are clear to auscultation. Skin:   Edema and discoloured skin , both feet are bluish. palmar mild erythema  Trunk: hunched posture.   Neurologic exam : The patient is awake and alert, oriented to place and time.  Memory subjective  described as intact. There is a normal attention span & concentration ability.  Speech is fluent without dysarthria,  Mild dysphonia , not aphasia. Mood and affect are appropriate. GDS score was 6 points.   Cranial nerves: Pupils are equal and briskly reactive to light. Funduscopic exam without  evidence of pallor or edema.  Rare Blink .  Wide open eyes, mimic and blink frequency are reduced . She also has right sided hearing loss.  Her speech   always  is dysphonic Extraocular movements  in vertical and horizontal planes not smooth , but able to direct gaze without nystagmus.  Visual fields by finger perimetry are intact. Hearing to finger rub intact on the left.  Facial sensation intact to fine touch. Facial motor strength is symmetric and tongue and uvula move midline. No jaw tremor.  Motor exam:   The patient provides good strength in all 4  extremities there is a minor reduction of range of motion at the shoulder level, her neck appears stiff. Mimic and blink frequency are reduced she also has rather severe hearing loss. Her speech always  is dysphonic. tremor is right dominant.  Sensory:  Fine touch, pinprick and vibration were normal.   Coordination: Rapid alternating movements in the fingers/hands were tested and found to be slow . The patient provided strong grip, in spite of some  atrophic hand muscles. There were no fasciculations noted.   Finger-to-nose maneuver tested and normal without evidence of ataxia, dysmetria , and mild  Tremor today (1).  Gait and station: Patient walks without  assistance , she rose without bracing herself.  She  was able to resist and remain standing for the initial pull, but was unstable when pushed from the back. Her body appeared rigid , unable to provide the self- righting reflexes.    Strength within normal limits. Decreased arm swing but no pil- rolling tremor.  Stance ( open eyed)  is stable and normal.  She walked quickly , and turned with 3 steps left and to the right, stabilized, no drift.   Deep tendon reflexes: in the  upper and lower extremities are symmetric, BRISK,  Babinski maneuver response is downgoing.   Assessment:  After physical and neurologic examination :  1) MSA , parkinsonian related syndrome, to be present.  The astonished facial expression and the skin discoloration are present, but not the restriction in gaze as seen in PSP. There is no evidence of dementia, but testing will be needed in future neurologic evaluations. These conditions require symptomatic treatment, and do not respond well to classic Parkinson's  disease medications.   I have refilled the TOPAMAX.     Melinda [name] daughter wants her mother to move closer to her own [place] area home and I will contact the movement disorder specialists at [facility] to provide follow up care there.  The patient suffers from a  progressive neurodegenerative disease and may need escalating care  In the future, I explained to Mrs [name] , that it would be beneficial  for her to find an assisted living arrangement  within a facility that can advance to more skilled care as needed.                          

## 2013-06-01 ENCOUNTER — Ambulatory Visit: Payer: Medicare PPO | Admitting: Physical Therapy

## 2013-06-08 ENCOUNTER — Emergency Department (HOSPITAL_COMMUNITY)
Admission: EM | Admit: 2013-06-08 | Discharge: 2013-06-08 | Disposition: A | Discharge status: Home / Self Care | Payer: Medicare PPO | Attending: Emergency Medicine | Admitting: Emergency Medicine

## 2013-06-08 ENCOUNTER — Encounter (HOSPITAL_COMMUNITY)
Admission: EM | Disposition: A | Discharge status: Home / Self Care | Payer: Self-pay | Source: Home / Self Care | Attending: Emergency Medicine

## 2013-06-08 ENCOUNTER — Encounter (HOSPITAL_COMMUNITY): Payer: Self-pay | Admitting: Emergency Medicine

## 2013-06-08 DIAGNOSIS — R1310 Dysphagia, unspecified: Secondary | ICD-10-CM

## 2013-06-08 DIAGNOSIS — K222 Esophageal obstruction: Secondary | ICD-10-CM

## 2013-06-08 DIAGNOSIS — K449 Diaphragmatic hernia without obstruction or gangrene: Secondary | ICD-10-CM | POA: Insufficient documentation

## 2013-06-08 DIAGNOSIS — Z7901 Long term (current) use of anticoagulants: Secondary | ICD-10-CM | POA: Insufficient documentation

## 2013-06-08 DIAGNOSIS — T18128A Food in esophagus causing other injury, initial encounter: Secondary | ICD-10-CM

## 2013-06-08 DIAGNOSIS — T18108A Unspecified foreign body in esophagus causing other injury, initial encounter: Secondary | ICD-10-CM | POA: Insufficient documentation

## 2013-06-08 DIAGNOSIS — I4891 Unspecified atrial fibrillation: Secondary | ICD-10-CM | POA: Insufficient documentation

## 2013-06-08 HISTORY — PX: ESOPHAGOGASTRODUODENOSCOPY: SHX5428

## 2013-06-08 SURGERY — EGD (ESOPHAGOGASTRODUODENOSCOPY)
Anesthesia: Moderate Sedation

## 2013-06-08 MED ORDER — FENTANYL CITRATE 0.05 MG/ML IJ SOLN
INTRAMUSCULAR | Status: DC | PRN
  Administered 2013-06-08 (×2): 25 ug via INTRAVENOUS

## 2013-06-08 MED ORDER — FENTANYL CITRATE 0.05 MG/ML IJ SOLN
INTRAMUSCULAR | Status: AC
  Filled 2013-06-08: qty 2

## 2013-06-08 MED ORDER — DIPHENHYDRAMINE HCL 50 MG/ML IJ SOLN
INTRAMUSCULAR | Status: AC
Start: 2013-06-08 — End: 2013-06-08
  Filled 2013-06-08: qty 1

## 2013-06-08 MED ORDER — BUTAMBEN-TETRACAINE-BENZOCAINE 2-2-14 % EX AERO
INHALATION_SPRAY | CUTANEOUS | Status: DC | PRN
  Administered 2013-06-08: 2 via TOPICAL

## 2013-06-08 MED ORDER — MIDAZOLAM HCL 10 MG/2ML IJ SOLN
INTRAMUSCULAR | Status: AC
  Filled 2013-06-08: qty 2

## 2013-06-08 MED ORDER — SODIUM CHLORIDE 0.9 % IV SOLN
INTRAVENOUS | Status: DC
  Administered 2013-06-08: 500 mL via INTRAVENOUS

## 2013-06-08 MED ORDER — MIDAZOLAM HCL 10 MG/2ML IJ SOLN
INTRAMUSCULAR | Status: DC | PRN
  Administered 2013-06-08: 2 mg via INTRAVENOUS
  Administered 2013-06-08: 1 mg via INTRAVENOUS
  Administered 2013-06-08: 2 mg via INTRAVENOUS

## 2013-06-08 MED ORDER — DIPHENHYDRAMINE HCL 50 MG/ML IJ SOLN
INTRAMUSCULAR | Status: DC | PRN
  Administered 2013-06-08: 25 mg via INTRAVENOUS

## 2013-06-08 NOTE — ED Notes (Signed)
Pt taken to Endoscopy. 

## 2013-06-08 NOTE — H&P (View-Only) (Signed)
[facility]  Provider:  Melvyn Novas, M D  Referring Provider: Lillia Mountain, MD Primary Care Physician:  Lillia Mountain, MD  Chief Complaint  Patient presents with  . Tremors    4 MO F/U    HPI:  Melinda [name] is a 72 y.o. female  Is seen here as a referral/ revisit  from Dr. Valentina Lucks after a recent second opinion visit at [facility] , Dr. Manson Passey.  The patient was first seen on 04-23-2011 when she presented with a Tremor , rigidity, masked face ( less facial expression, and wide open , scared looking eyes) . She had a rare blink and her affect appears somewhat flat.  Noticed was also dysphonia and she endorsed a high level of fatigue. She denied a hallucinations and swallowing difficulties, was able to walk. 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   In early 2014  additional changes were clearly seen, She was unable to rise now or from a seated position without bracing herself.  Sitting on the exam table ,she was crossing her arms in front of her and  a significant tremor in her right hand was noticed  to a higher amplitude than in the left hand. This asymetry was concerning.  I also noticed in her visit in July 2013 , that the cogwheeling over the Biceps and rigidity over the wrists were now strong , and I initiated a trial of brenztropin.  The patient stated in June 2014, that the initial trial on  ARTANE  had made her sick - so she was at the time not feeling impaired enough to use another tremor medication, but now is worsening rapidly.  (Meanwhile they have been some medical developments,  the patient was diagnosed with atrial fibrillation. She remained on metoprolol for rate control , on flecainide, and on XERALTO for anticoagulation.  Patient and her daughter report both that Mrs. Dangler is easily very fatigued and  frequently  lightheaded when standing up  or changing posture.  This proceeded the diagnosis of atrial fibrillation and has not been improved by the treatment of Atrial fibrillation.) There is a strong component of dysautonomia to her complains.   Failed artane - got sick, but developed more tremor.  We tried Sinemet tid , again she was subjectively reporting no improvement in tremor or rigidity.  I had ordered PT for gait stabilisation, OT and ST - she has a high risk- fall tendency  is propulsive and retropulsive.  This patient had more parkinsonian features to her movement disorder , when last seen in June , and today has further progressed. Her astonished , masked facial expression remains. She has full EOM.  There are  bluish discoloured feet and ankles and edema, with normal pulses .  Palmar erythema is mild, but muscle atrophy in fingers , thenar eminence and metacarpal is more pronounced than I recall.  Patient was started on Topamax bid, increased recently to 50 mg bid, and had a good resolution of tremor. She is happier, and was impressed with Dr .Forestine Na at [facility]. She will continue with PT , and a walker.       Review of Systems: Out of a complete 14 system review, the patient complains of only the following symptoms, and all other reviewed systems are negative.   today endorsed neck stiffness- fatigue, tremor, unsteady gait and dizziness  when changing posture , but at times while already walking for a while.  No fever, nausea, dyphagia, pain. Nausea temporarily on Mestinon , which she discontinued.   History   Social History  . Marital Status: Divorced    Spouse Name: N/A    Number of Children: 2  . Years of Education: Masters   Occupational History  . retired    Social History Main Topics  . Smoking status: Former Smoker -- 0.50 packs/day for 20 years    Types: Cigarettes    Quit date: 07/23/1992  . Smokeless tobacco: Never Used     Comment: quit 15 years ago  .  Alcohol Use: No  . Drug Use: No  . Sexual Activity: Not Currently   Other Topics Concern  . Not on file   Social History Narrative   Patient lives at home alone . Patient is single. Patient is retired.   Right handed.   College- masters.   Caffeine - two cups daily.    Family History  Problem Relation Age of Onset  . Migraines Mother   . Anxiety disorder Son   . Migraines Daughter   . Anxiety disorder Daughter     Past Medical History  Diagnosis Date  . Depression   . Anxiety   . Thyroid disease   . Hearing loss   . PONV (postoperative nausea and vomiting)   . Arthritis   . Hypothyroidism   . Tremors of nervous system   . Dysrhythmia     brief rapid afib 01/2012;   john griffin  md  . Orthostatic hypotension   . Sinusitis   . Cancer     uterus removed in 1974  . 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 Osteopenia   . Allergic rhinitis   . History of edema   . Acute low back pain with possible spinal stenosis of less than six weeks' duration   . History of diarrhea   . Cervical intraepithelial neoplasia   . Ovarian cyst   . Bilateral hearing loss   . Rib fractures   . Metatarsalgia of right foot   . Helicobacter pylori gastritis   . Atrial fibrillation with rapid ventricular response   . Multiple system atrophy with bradykinesia     Past Surgical History  Procedure Laterality Date  . Back surgery    . Abdominal hysterectomy    . Breast enhancement surgery    . Hammer toe surgery    . Inguinal hernia repair  06/27/2012    Procedure: HERNIA REPAIR INGUINAL ADULT;  Surgeon: Shelly Rubenstein, MD;  Location: [facility] OR;  Service: General;  Laterality: Right;  . Insertion of mesh  06/27/2012    Procedure: INSERTION OF MESH;  Surgeon: Shelly Rubenstein, MD;  Location: [facility] OR;  Service: General;  Laterality: Right;  . Hernia repair    . Inguinal hernia repair Right 11/17/2012    Procedure: REPAIR  RIGHT RECURRENT INGUINAL HERNIA;  Surgeon: Shelly Rubenstein, MD;  Location: [facility] OR;  Service: General;  Laterality: Right;  . Insertion of mesh Right 11/17/2012    Procedure: INSERTION OF MESH;  Surgeon: Shelly Rubenstein, MD;  Location: [facility] OR;  Service: General;  Laterality: Right;  . Tonsillectomy    . Foot surgery Left   . Ganglion cyst excision    . Breast enhancement surgery    . Toe surgery Right   . Rih  Current Outpatient Prescriptions  Medication Sig Dispense Refill  . buPROPion (WELLBUTRIN XL) 150 MG 24 hr tablet Take 450 mg by mouth every morning.       . Calcium Carbonate-Vitamin D (CALTRATE 600+D PO) Take 1 tablet by mouth daily.      . calcium citrate-vitamin D (CITRACAL+D) 315-200 MG-UNIT per tablet Take 1 tablet by mouth 2 (two) times daily.      . clonazePAM (KLONOPIN) 0.5 MG tablet Take 0.5 mg by mouth at bedtime as needed. For sleep      . estradiol (ESTRACE) 2 MG tablet Take 2 mg by mouth daily.      . flecainide (TAMBOCOR) 100 MG tablet Take 1 tablet (100 mg total) by mouth 2 (two) times daily.  60 tablet  6  . fludrocortisone (FLORINEF) 0.1 MG tablet Take 0.3 mg by mouth daily.      . hydrOXYzine (VISTARIL) 25 MG capsule Take 25 mg by mouth at bedtime.       Marland Kitchen levothyroxine (SYNTHROID, LEVOTHROID) 75 MCG tablet Take 75 mcg by mouth daily.      . metoprolol succinate (TOPROL-XL) 25 MG 24 hr tablet Take 25 mg by mouth daily.      . midodrine (PROAMATINE) 5 MG tablet Take 1 tablet (5 mg total) by mouth 3 (three) times daily with meals.  90 tablet  11  . PARoxetine (PAXIL) 30 MG tablet Take 60 mg by mouth every morning.      . potassium chloride 20 MEQ/15ML (10%) solution As directed      . Rivaroxaban (XARELTO) 20 MG TABS Take 1 tablet (20 mg total) by mouth daily with supper.  30 tablet  11  . topiramate (TOPAMAX) 25 MG tablet Take 1 tablet (25 mg total) by mouth 2 (two) times daily. 2 tablets twice daily.  120 tablet  5   No current facility-administered  medications for this visit.    Allergies as of 05/28/2013 - Review Complete 05/28/2013  Allergen Reaction Noted  . Other Anaphylaxis 01/31/2012  . Pollen extract  03/11/2013  . Ace inhibitors  01/29/2013  . Sinemet [carbidopa-levodopa]  03/12/2013    Vitals: BP 157/91  Pulse 68  Ht 5\' 7"  (1.702 m)  Wt 175 lb (79.379 kg)  BMI 27.40 kg/m2 Last Weight:  Wt Readings from Last 1 Encounters:  05/28/13 175 lb (79.379 kg)   Last Height:   Ht Readings from Last 1 Encounters:  05/28/13 5\' 7"  (1.702 m)    Physical exam:  General: The patient is awake, alert and appears not in acute distress. The patient is well groomed. Head: Normocephalic, atraumatic.- masked face.  Neck is rigid - Cardiovascular:  Regular  Rhythm, 78 bpm , without  murmurs or carotid bruit, and without distended neck veins. Respiratory: Lungs are clear to auscultation. Skin:   Edema and discoloured skin , both feet are bluish. palmar mild erythema  Trunk: hunched posture.   Neurologic exam : The patient is awake and alert, oriented to place and time.  Memory subjective  described as intact. There is a normal attention span & concentration ability.  Speech is fluent without dysarthria,  Mild dysphonia , not aphasia. Mood and affect are appropriate. GDS score was 6 points.   Cranial nerves: Pupils are equal and briskly reactive to light. Funduscopic exam without  evidence of pallor or edema.  Rare Blink .  Wide open eyes, mimic and blink frequency are reduced . She also has right sided hearing loss.  Her speech  always  is dysphonic Extraocular movements  in vertical and horizontal planes not smooth , but able to direct gaze without nystagmus.  Visual fields by finger perimetry are intact. Hearing to finger rub intact on the left.  Facial sensation intact to fine touch. Facial motor strength is symmetric and tongue and uvula move midline. No jaw tremor.  Motor exam:   The patient provides good strength in all 4  extremities there is a minor reduction of range of motion at the shoulder level, her neck appears stiff. Mimic and blink frequency are reduced she also has rather severe hearing loss. Her speech always  is dysphonic. tremor is right dominant.  Sensory:  Fine touch, pinprick and vibration were normal.   Coordination: Rapid alternating movements in the fingers/hands were tested and found to be slow . The patient provided strong grip, in spite of some  atrophic hand muscles. There were no fasciculations noted.   Finger-to-nose maneuver tested and normal without evidence of ataxia, dysmetria , and mild  Tremor today (1).  Gait and station: Patient walks without  assistance , she rose without bracing herself.  She  was able to resist and remain standing for the initial pull, but was unstable when pushed from the back. Her body appeared rigid , unable to provide the self- righting reflexes.    Strength within normal limits. Decreased arm swing but no pil- rolling tremor.  Stance ( open eyed)  is stable and normal.  She walked quickly , and turned with 3 steps left and to the right, stabilized, no drift.   Deep tendon reflexes: in the  upper and lower extremities are symmetric, BRISK,  Babinski maneuver response is downgoing.   Assessment:  After physical and neurologic examination :  1) MSA , parkinsonian related syndrome, to be present.  The astonished facial expression and the skin discoloration are present, but not the restriction in gaze as seen in PSP. There is no evidence of dementia, but testing will be needed in future neurologic evaluations. These conditions require symptomatic treatment, and do not respond well to classic Parkinson's  disease medications.   I have refilled the TOPAMAX.     Mrs. Hazle Quant daughter wants her mother to move closer to her own [place] area home and I will contact the movement disorder specialists at [facility] to provide follow up care there.  The patient suffers from a  progressive neurodegenerative disease and may need escalating care  In the future, I explained to Mrs Dorothyann Gibbs , that it would be beneficial  for her to find an assisted living arrangement  within a facility that can advance to more skilled care as needed.

## 2013-06-08 NOTE — Op Note (Signed)
[facility] [address] [place], [address]   ENDOSCOPY PROCEDURE REPORT  PATIENT: Melinda [name], Melinda [name]  MR#: [id number] BIRTHDATE: 1941/02/03 , 71  yrs. old GENDER: Female ENDOSCOPIST: Meryl Dare, MD, [facility] PROCEDURE DATE:  06/08/2013 PROCEDURE:  EGD w/ fb removal ASA CLASS:     Class III INDICATIONS:  Dysphagia.   Foreign body removal from esophagus. Food impaction. MEDICATIONS: These medications were titrated to patient response per physician's verbal order, Fentanyl 50 mcg IV, Versed 5 mg IV, and Diphenhydramine (Benadryl) 25 mg IV TOPICAL ANESTHETIC: Cetacaine Spray DESCRIPTION OF PROCEDURE: After the risks benefits and alternatives of the procedure were thoroughly explained, informed consent was obtained.  The endoscope [id number] endoscope was introduced through the mouth and advanced to the second portion of the duodenum without limitations.  The instrument was slowly withdrawn as the mucosa was fully examined.  ESOPHAGUS: Food impaction, large piece of chicken, impacted in upper esophagus just beyond UES. Partially removed with a tripod grasper and the rest was gently advanced into the stomach under direct visualization. Two strictures were found, in the upper third of the esophagus and at the EGJ.  There was an inlet patch at the site of the upper esophageal stricture. The mucosa was friable at the site of the food impaction. The esophagus was otherwise normal. STOMACH: The mucosa and folds of the stomach appeared normal. DUODENUM: The duodenal mucosa showed no abnormalities in the bulb and second portion of the duodenum.  Retroflexed views revealed a small hiatal hernia.  The scope was then withdrawn from the patient and the procedure completed.  COMPLICATIONS: There were no complications. ENDOSCOPIC IMPRESSION: 1.   Food impaction in upper esophagus; removed 2.   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   Esophageal inlet  patch 4.   Small hiatal hernia  RECOMMENDATIONS: 1.  Anti-reflux regimen long term 2.  PPI daily long term-for now Prilosec 20 mg OTC, once daily 3.  Full liquids tonight and the soft diet only until dilation (no meats, breads or other hard to swallow foods) 4.  Endoscopy with dilation per Dr. Jarold Motto off Xarelto  eSigned:  Meryl Dare, MD, [facility] 06/08/2013 5:21 PM

## 2013-06-08 NOTE — ED Notes (Signed)
MD at bedside. Dr. Allen at bedside.  

## 2013-06-08 NOTE — Consult Note (Signed)
[facility] Consult  Referring Provider: No ref. provider found Primary Care Physician:  Lillia Mountain, MD Primary Gastroenterologist:  Dr. Sheryn Bison  Reason for Consultation:  Food impaction  HPI: Melinda [name] is a 72 y.o. female who is known to Dr. Jarold Motto for previous food impactions and esophageal stricture dilations.  Her last was in 10/2009 at which time she had a meat impaction.  She returned in 12/2009 and had proximal esophageal stricture dilated with [address].    Came to the ED today with food impaction with chicken.  Ate it at 11 AM today.  Not able to swallow her saliva or sips of water.  Has no pain currently, but feels it stuck (points to proximal esophagus).  Is on Xarelto and yesterday evening was the last dose.  Says that she has not experienced any other issues with swallowing recently but usually chews her food very well.     Past Medical History  Diagnosis Date  . Depression   . Anxiety   . Thyroid disease   . Hearing loss   . PONV (postoperative nausea and vomiting)   . Arthritis   . Hypothyroidism   . Tremors of nervous system   . Dysrhythmia     brief rapid afib 01/2012;   john griffin  md  . Orthostatic hypotension   . Sinusitis   . Cancer     uterus removed in 1974  . 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 Osteopenia   . Allergic rhinitis   . History of edema   . Acute low back pain with possible spinal stenosis of less than six weeks' duration   . History of diarrhea   . Cervical intraepithelial neoplasia   . Ovarian cyst   . Bilateral hearing loss   . Rib fractures   . Metatarsalgia of right foot   . Helicobacter pylori gastritis   . Atrial fibrillation with rapid ventricular response   . Multiple system atrophy with bradykinesia     Past Surgical History  Procedure Laterality Date  . Back surgery    . Abdominal hysterectomy    . Breast enhancement  surgery    . Hammer toe surgery    . Inguinal hernia repair  06/27/2012    Procedure: HERNIA REPAIR INGUINAL ADULT;  Surgeon: Shelly Rubenstein, MD;  Location: [facility] OR;  Service: General;  Laterality: Right;  . Insertion of mesh  06/27/2012    Procedure: INSERTION OF MESH;  Surgeon: Shelly Rubenstein, MD;  Location: [facility] OR;  Service: General;  Laterality: Right;  . Hernia repair    . Inguinal hernia repair Right 11/17/2012    Procedure: REPAIR RIGHT RECURRENT INGUINAL HERNIA;  Surgeon: Shelly Rubenstein, MD;  Location: [facility] OR;  Service: General;  Laterality: Right;  . Insertion of mesh Right 11/17/2012    Procedure: INSERTION OF MESH;  Surgeon: Shelly Rubenstein, MD;  Location: [facility] OR;  Service: General;  Laterality: Right;  . Tonsillectomy    . Foot surgery Left   . Ganglion cyst excision    . Breast enhancement surgery    . Toe surgery Right   . Rih      Prior to Admission medications   Medication Sig Start Date End Date Taking? Authorizing Provider  buPROPion (WELLBUTRIN XL) 150 MG 24 hr tablet Take 450 mg by mouth every morning.    Yes Historical Provider, MD  Calcium Carbonate-Vitamin D (  CALTRATE 600+D PO) Take 1 tablet by mouth daily.   Yes Historical Provider, MD  clonazePAM (KLONOPIN) 0.5 MG tablet Take 0.5 mg by mouth at bedtime as needed. For sleep   Yes Historical Provider, MD  estradiol (ESTRACE) 2 MG tablet Take 2 mg by mouth daily.   Yes Historical Provider, MD  flecainide (TAMBOCOR) 100 MG tablet Take 1 tablet (100 mg total) by mouth 2 (two) times daily. 05/21/13  Yes Donato Schultz, MD  fludrocortisone (FLORINEF) 0.1 MG tablet Take 0.3 mg by mouth daily.   Yes Historical Provider, MD  hydrOXYzine (VISTARIL) 25 MG capsule Take 25 mg by mouth at bedtime.  11/14/12  Yes Historical Provider, MD  levothyroxine (SYNTHROID, LEVOTHROID) 75 MCG tablet Take 75 mcg by mouth daily.   Yes Historical Provider, MD  metoprolol succinate (TOPROL-XL) 25 MG 24 hr tablet Take 50 mg by mouth 2 (two)  times daily.    Yes Historical Provider, MD  midodrine (PROAMATINE) 10 MG tablet Take 10 mg by mouth 3 (three) times daily.   Yes Historical Provider, MD  PARoxetine (PAXIL) 30 MG tablet Take 60 mg by mouth every morning.   Yes Historical Provider, MD  potassium chloride 20 MEQ/15ML (10%) solution Take 20 mEq by mouth daily. As directed 04/13/13  Yes Historical Provider, MD  Rivaroxaban (XARELTO) 20 MG TABS Take 1 tablet (20 mg total) by mouth daily with supper. 07/24/12  Yes Lillia Mountain, MD  topiramate (TOPAMAX) 25 MG tablet Take 50 mg by mouth 2 (two) times daily.   Yes Historical Provider, MD    No current facility-administered medications for this encounter.   Current Outpatient Prescriptions  Medication Sig Dispense Refill  . buPROPion (WELLBUTRIN XL) 150 MG 24 hr tablet Take 450 mg by mouth every morning.       . Calcium Carbonate-Vitamin D (CALTRATE 600+D PO) Take 1 tablet by mouth daily.      . clonazePAM (KLONOPIN) 0.5 MG tablet Take 0.5 mg by mouth at bedtime as needed. For sleep      . estradiol (ESTRACE) 2 MG tablet Take 2 mg by mouth daily.      . flecainide (TAMBOCOR) 100 MG tablet Take 1 tablet (100 mg total) by mouth 2 (two) times daily.  60 tablet  6  . fludrocortisone (FLORINEF) 0.1 MG tablet Take 0.3 mg by mouth daily.      . hydrOXYzine (VISTARIL) 25 MG capsule Take 25 mg by mouth at bedtime.       Marland Kitchen levothyroxine (SYNTHROID, LEVOTHROID) 75 MCG tablet Take 75 mcg by mouth daily.      . metoprolol succinate (TOPROL-XL) 25 MG 24 hr tablet Take 50 mg by mouth 2 (two) times daily.       . midodrine (PROAMATINE) 10 MG tablet Take 10 mg by mouth 3 (three) times daily.      Marland Kitchen PARoxetine (PAXIL) 30 MG tablet Take 60 mg by mouth every morning.      . potassium chloride 20 MEQ/15ML (10%) solution Take 20 mEq by mouth daily. As directed      . Rivaroxaban (XARELTO) 20 MG TABS Take 1 tablet (20 mg total) by mouth daily with supper.  30 tablet  11  . topiramate (TOPAMAX) 25 MG  tablet Take 50 mg by mouth 2 (two) times daily.        Allergies as of 06/08/2013 - Review Complete 06/08/2013  Allergen Reaction Noted  . Other Anaphylaxis 01/31/2012  . Pollen extract  03/11/2013  .  Ace inhibitors  01/29/2013  . Sinemet [carbidopa-levodopa]  03/12/2013    Family History  Problem Relation Age of Onset  . Migraines Mother   . Anxiety disorder Son   . Migraines Daughter   . Anxiety disorder Daughter     History   Social History  . Marital Status: Divorced    Spouse Name: N/A    Number of Children: 2  . Years of Education: Masters   Occupational History  . retired    Social History Main Topics  . Smoking status: Former Smoker -- 0.50 packs/day for 20 years    Types: Cigarettes    Quit date: 07/23/1992  . Smokeless tobacco: Never Used     Comment: quit 15 years ago  . Alcohol Use: No  . Drug Use: No  . Sexual Activity: Not Currently   Other Topics Concern  . Not on file   Social History Narrative   Patient lives at home alone . Patient is single. Patient is retired.   Right handed.   College- masters.   Caffeine - two cups daily.    Physical Exam: Vital signs in last 24 hours: Temp:  [97.7 F (36.5 C)] 97.7 F (36.5 C) (11/24 1442) Pulse Rate:  [77] 77 (11/24 1442) Resp:  [22] 22 (11/24 1442) BP: (143)/(80) 143/80 mmHg (11/24 1442) SpO2:  [100 %] 100 % (11/24 1442)   General:   Alert, Well-developed, well-nourished, pleasant and cooperative in NAD Head:  Normocephalic and atraumatic. Eyes:  Sclera clear, no icterus.  Conjunctiva pink. Ears:  Normal auditory acuity. Mouth:  No deformity or lesions.   Lungs:  Clear throughout to auscultation.  No wheezes, crackles, or rhonchi.  Heart:  Regular rate and rhythm. Abdomen:  Soft, non-distended.  BS present.  Non-tender. Rectal:  Deferred  Msk:  Symmetrical without gross deformities. Pulses:  Normal pulses noted. Extremities:  Without clubbing or edema. Neurologic:  Alert and  oriented  x4;  grossly normal neurologically. Skin:  Intact without significant lesions or rashes. Psych:  Alert and cooperative. Normal mood and affect.  IMPRESSION:  -Food impaction:  Previous history of proximal esophageal stricture, last dilated in 10/2009. -Chronic anticoagulation with xarelto.  PLAN: -EGD today for food impaction removal.   ZEHR, JESSICA D.  06/08/2013, 3:43 PM  Pager number [phone]     Attending physician's note   I have taken a history, examined the patient and reviewed the chart. I agree with the Advanced Practitioner's note, impression and recommendations. Acute dysphagia due to a food impaction. Urgent EGD today. Chronic anticoagulation with Xarelto will preclude dilation today. If a stricture is found she will need an elective EGD off Xarelto with Dr. Jarold Motto.  Meryl Dare, MD [place]

## 2013-06-08 NOTE — Interval H&P Note (Signed)
History and Physical Interval Note:  06/08/2013 4:20 PM  Melinda [name]  has presented today for surgery, with the diagnosis of Food impaction  The various methods of treatment have been discussed with the patient and family. After consideration of risks, benefits and other options for treatment, the patient has consented to  Procedure(s): ESOPHAGOGASTRODUODENOSCOPY (EGD) (N/A) as a surgical intervention .  The patient's history has been reviewed, patient examined, no change in status, stable for surgery.  I have reviewed the patient's chart and labs.  Questions were answered to the patient's satisfaction.     Venita Lick. Russella Dar MD [facility] [place]

## 2013-06-08 NOTE — ED Notes (Signed)
Pt reports eating chicken around 1100, states a piece is stuck in her throat. Has been having dry non-productive cough since. Lung sounds clear, saO2 100% ra

## 2013-06-08 NOTE — ED Provider Notes (Signed)
CSN: [id number]     Arrival date & time 06/08/13  1433 History   First MD Initiated Contact with Patient 06/08/13 1516     Chief Complaint  Patient presents with  . food stuck in throat    (Consider location/radiation/quality/duration/timing/severity/associated sxs/prior Treatment) The history is provided by the patient.   here complaining of food getting stuck in her throat x4 hours. Patient was eating chicken and was unable to swallow them he.She has been vomiting her secretions. History of similar symptoms in the past and had to have an esophageal stretching. No treatment used prior to arrival. Symptoms are worse with swallowing.  Past Medical History  Diagnosis Date  . Depression   . Anxiety   . Thyroid disease   . Hearing loss   . PONV (postoperative nausea and vomiting)   . Arthritis   . Hypothyroidism   . Tremors of nervous system   . Dysrhythmia     brief rapid afib 01/2012;   john griffin  md  . Orthostatic hypotension   . Sinusitis   . Cancer     uterus removed in 1974  . 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 Osteopenia   . Allergic rhinitis   . History of edema   . Acute low back pain with possible spinal stenosis of less than six weeks' duration   . History of diarrhea   . Cervical intraepithelial neoplasia   . Ovarian cyst   . Bilateral hearing loss   . Rib fractures   . Metatarsalgia of right foot   . Helicobacter pylori gastritis   . Atrial fibrillation with rapid ventricular response   . Multiple system atrophy with bradykinesia    Past Surgical History  Procedure Laterality Date  . Back surgery    . Abdominal hysterectomy    . Breast enhancement surgery    . Hammer toe surgery    . Inguinal hernia repair  06/27/2012    Procedure: HERNIA REPAIR INGUINAL ADULT;  Surgeon: Shelly Rubenstein, MD;  Location: [facility] OR;  Service: General;  Laterality: Right;  . Insertion of mesh  06/27/2012     Procedure: INSERTION OF MESH;  Surgeon: Shelly Rubenstein, MD;  Location: [facility] OR;  Service: General;  Laterality: Right;  . Hernia repair    . Inguinal hernia repair Right 11/17/2012    Procedure: REPAIR RIGHT RECURRENT INGUINAL HERNIA;  Surgeon: Shelly Rubenstein, MD;  Location: [facility] OR;  Service: General;  Laterality: Right;  . Insertion of mesh Right 11/17/2012    Procedure: INSERTION OF MESH;  Surgeon: Shelly Rubenstein, MD;  Location: [facility] OR;  Service: General;  Laterality: Right;  . Tonsillectomy    . Foot surgery Left   . Ganglion cyst excision    . Breast enhancement surgery    . Toe surgery Right   . Rih     Family History  Problem Relation Age of Onset  . Migraines Mother   . Anxiety disorder Son   . Migraines Daughter   . Anxiety disorder Daughter    History  Substance Use Topics  . Smoking status: Former Smoker -- 0.50 packs/day for 20 years    Types: Cigarettes    Quit date: 07/23/1992  . Smokeless tobacco: Never Used     Comment: quit 15 years ago  . Alcohol Use: No   OB History   Grav Para Term Preterm Abortions TAB SAB Ect Mult Living  Review of Systems  All other systems reviewed and are negative.    Allergies  Other; Pollen extract; Ace inhibitors; and Sinemet  Home Medications   Current Outpatient Rx  Name  Route  Sig  Dispense  Refill  . buPROPion (WELLBUTRIN XL) 150 MG 24 hr tablet   Oral   Take 450 mg by mouth every morning.          . Calcium Carbonate-Vitamin D (CALTRATE 600+D PO)   Oral   Take 1 tablet by mouth daily.         . clonazePAM (KLONOPIN) 0.5 MG tablet   Oral   Take 0.5 mg by mouth at bedtime as needed. For sleep         . estradiol (ESTRACE) 2 MG tablet   Oral   Take 2 mg by mouth daily.         . flecainide (TAMBOCOR) 100 MG tablet   Oral   Take 1 tablet (100 mg total) by mouth 2 (two) times daily.   60 tablet   6   . fludrocortisone (FLORINEF) 0.1 MG tablet   Oral   Take 0.3 mg by mouth  daily.         . hydrOXYzine (VISTARIL) 25 MG capsule   Oral   Take 25 mg by mouth at bedtime.          Marland Kitchen levothyroxine (SYNTHROID, LEVOTHROID) 75 MCG tablet   Oral   Take 75 mcg by mouth daily.         . metoprolol succinate (TOPROL-XL) 25 MG 24 hr tablet   Oral   Take 50 mg by mouth 2 (two) times daily.          . midodrine (PROAMATINE) 10 MG tablet   Oral   Take 10 mg by mouth 3 (three) times daily.         Marland Kitchen PARoxetine (PAXIL) 30 MG tablet   Oral   Take 60 mg by mouth every morning.         . potassium chloride 20 MEQ/15ML (10%) solution   Oral   Take 20 mEq by mouth daily. As directed         . Rivaroxaban (XARELTO) 20 MG TABS   Oral   Take 1 tablet (20 mg total) by mouth daily with supper.   30 tablet   11   . topiramate (TOPAMAX) 25 MG tablet   Oral   Take 50 mg by mouth 2 (two) times daily.          BP 143/80  Pulse 77  Temp(Src) 97.7 F (36.5 C) (Axillary)  Resp 22  SpO2 100% Physical Exam  Nursing note and vitals reviewed. Constitutional: She is oriented to person, place, and time. She appears well-developed and well-nourished.  Non-toxic appearance. No distress.  HENT:  Head: Normocephalic and atraumatic.  Eyes: Conjunctivae, EOM and lids are normal. Pupils are equal, round, and reactive to light.  Neck: Normal range of motion. Neck supple. No tracheal deviation present. No mass present.  Cardiovascular: Normal rate, regular rhythm and normal heart sounds.  Exam reveals no gallop.   No murmur heard. Pulmonary/Chest: Effort normal and breath sounds normal. No stridor. No respiratory distress. She has no decreased breath sounds. She has no wheezes. She has no rhonchi. She has no rales.  Abdominal: Soft. Normal appearance and bowel sounds are normal. She exhibits no distension. There is no tenderness. There is no rebound and no CVA tenderness.  Musculoskeletal: Normal  range of motion. She exhibits no edema and no tenderness.   Neurological: She is alert and oriented to person, place, and time. She has normal strength. No cranial nerve deficit or sensory deficit. GCS eye subscore is 4. GCS verbal subscore is 5. GCS motor subscore is 6.  Skin: Skin is warm and dry. No abrasion and no rash noted.  Psychiatric: She has a normal mood and affect. Her speech is normal and behavior is normal.    ED Course  Procedures (including critical care time) Labs Review Labs Reviewed - No data to display Imaging Review No results found.  EKG Interpretation   None       MDM  No diagnosis found. Pt to be seen by gi    Toy Baker, MD 06/08/13 [phone]

## 2013-06-08 NOTE — ED Notes (Signed)
Pt with no acute distress. Sitting up in bed. Breaths even/unlabored.

## 2013-06-09 ENCOUNTER — Encounter (HOSPITAL_COMMUNITY): Payer: Self-pay | Admitting: Gastroenterology

## 2013-06-09 ENCOUNTER — Ambulatory Visit (INDEPENDENT_AMBULATORY_CARE_PROVIDER_SITE_OTHER): Payer: Medicare PPO | Admitting: Nurse Practitioner

## 2013-06-09 ENCOUNTER — Telehealth: Payer: Self-pay | Admitting: *Deleted

## 2013-06-09 VITALS — BP 140/90 | HR 68 | Ht 67.0 in | Wt 173.8 lb

## 2013-06-09 DIAGNOSIS — K222 Esophageal obstruction: Secondary | ICD-10-CM

## 2013-06-09 DIAGNOSIS — R1310 Dysphagia, unspecified: Secondary | ICD-10-CM

## 2013-06-09 NOTE — Telephone Encounter (Signed)
06/09/2013   RE: Melinda [name] DOB: [birth year] MRN: [id number]   Dear Dr. Donato Schultz,    We have scheduled the above patient for an endoscopic procedure. Our records show that she is on anticoagulation therapy.   Please advise as to how long the patient may come off her therapy of Xarelto prior to the procedure, which is scheduled for 06-29-2013.  Please fax back/ or route the completed form to [facility] CMA at [phone].   Sincerely,    Amy Esterwood PA-C     Lowry Ram CMA

## 2013-06-09 NOTE — Patient Instructions (Signed)

## 2013-06-09 NOTE — Telephone Encounter (Signed)
OK to hold for procedure. 2 days is sufficient.

## 2013-06-09 NOTE — Progress Notes (Signed)
     History of Present Illness:   Patient is a 72 year old female known to Dr. Jarold Motto for a history of esophageal strictures / food impactions.  She had not been having dysphagia at home but ate a piece of chicken too quickly yesterday and subsequently realized it was stuck in her esophagus.  She presented to ED for evaluation. Emergent EGD was performed by Dr. Russella Dar, our hospital physician for the week. Food removed from upper esophagus. Strictures in the upper third of the esophagus and at the [facility] were seen.  Patient was advised to see Dr. Jarold Motto to discuss repeat EGD with dilation off Xarelto.   Current Medications, Allergies, Past Medical History, Past Surgical History, Family History and Social History were reviewed in [employer] record.  Physical Exam: General: Pleasant, well developed , white female in no acute distress Head: Normocephalic and atraumatic Eyes:  sclerae anicteric, conjunctiva pink  Ears: Normal auditory acuity Lungs: Clear throughout to auscultation Heart: Regular rate and rhythm Abdomen: Soft, non distended, non-tender. No masses, no hepatomegaly. Normal bowel sounds Musculoskeletal: Symmetrical with no gross deformities  Extremities: No edema  Neurological: Alert oriented x 4, grossly nonfocal Psychological:  Alert and cooperative. Normal mood and affect  Assessment and Recommendations:  53. 72 year old female with recurrent food impaction, s/p EGD with removal of food 06/08/13. She needs repeat EGD with dilation of strictures seen on EGD yesterday. Patient takes Emeterio Reeve which will need to be held pre-procedure. The benefits, risks, and potential complications of EGD with possible biopsies and/or dilation were discussed with the patient and she agrees to proceed.   2. History of atrial fibrillation with RVR. Patient maintained on Xarelto. Will contact prescribing MD about holding this medication for EGD.   3. Parkinson's related  syndrome. Followed at [facility].

## 2013-06-10 ENCOUNTER — Telehealth: Payer: Self-pay | Admitting: *Deleted

## 2013-06-10 ENCOUNTER — Encounter: Payer: Self-pay | Admitting: Nurse Practitioner

## 2013-06-10 NOTE — Telephone Encounter (Signed)
Dr. Anne Fu got back to [place] and he wants the patient to hold the Xarelto on 12-13, 12-14, 12-15 and she can resume it on 12-16.  I left a message with this information and told the patient to call me if she has any questions.

## 2013-06-10 NOTE — Telephone Encounter (Signed)
Per Dr. Donato Schultz, of to hold for procedure. 2 days is sufficient. LM for patient and told patient to call if she has any questions.

## 2013-06-16 ENCOUNTER — Encounter: Payer: Self-pay | Admitting: Gastroenterology

## 2013-06-18 IMAGING — CT CT ABD-PELV W/ CM
3 of 5 series · 12 of 36 positions shown, 18 images · IV contrast (OMNI 300, WATER & [ID] OMNI 300)
Comparison: None.

CLINICAL DATA: Epigastric and right abdominal pain for 2 weeks

CT ABDOMEN AND PELVIS WITH CONTRAST
TECHNIQUE: Multidetector CT imaging of the abdomen and pelvis was
performed following the standard protocol during bolus
administration of intravenous contrast.
Contrast: 30mL OMNIPAQUE IOHEXOL 300 MG/ML  SOLN, 100mL OMNIPAQUE
IOHEXOL 300 MG/ML  SOLN

[Series 3: abd/pelvis with · axial · 0.72mm/px · z∈[-381,-71]mm · 7 of 84 slices shown, 12 images]
[im 11/84  soft-tissue]
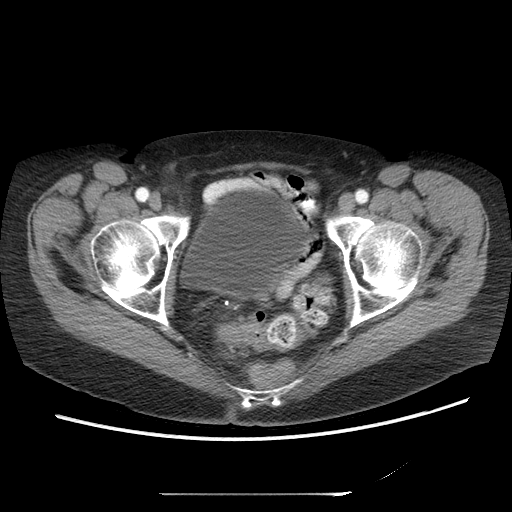
[im 11/84  bone]
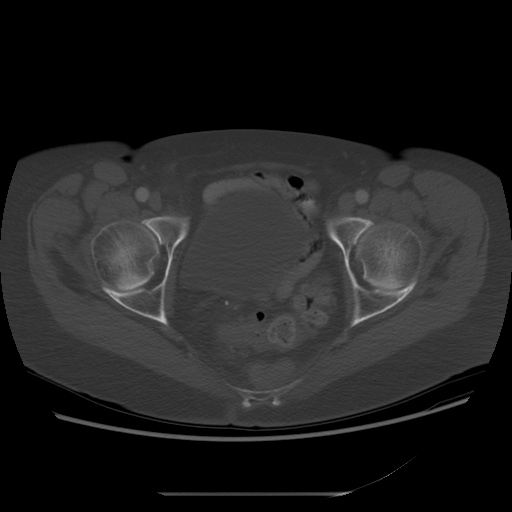
[im 21/84  soft-tissue]
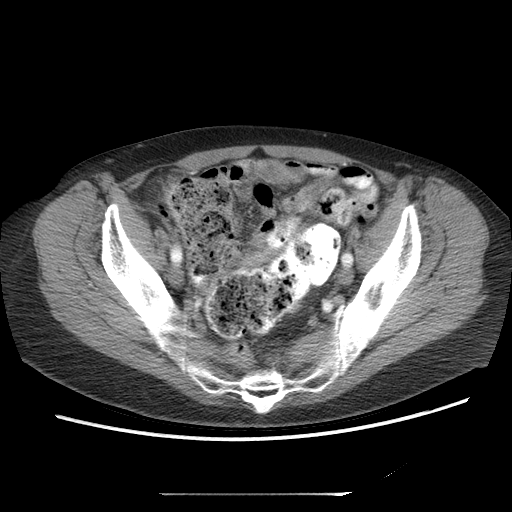
[im 32/84  soft-tissue]
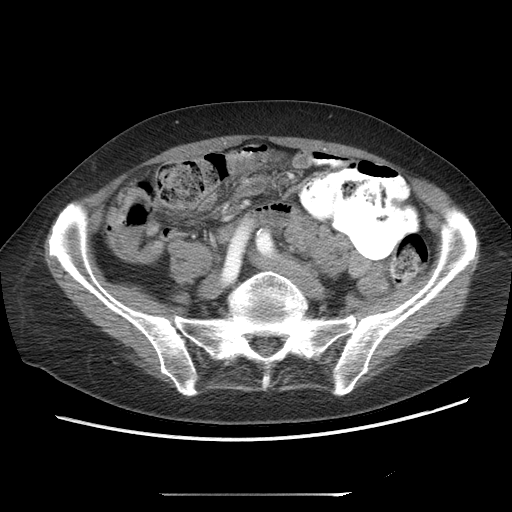
[im 42/84  soft-tissue]
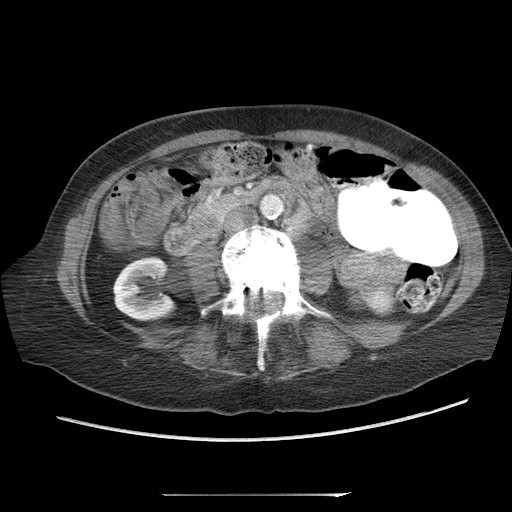
[im 42/84  lung]
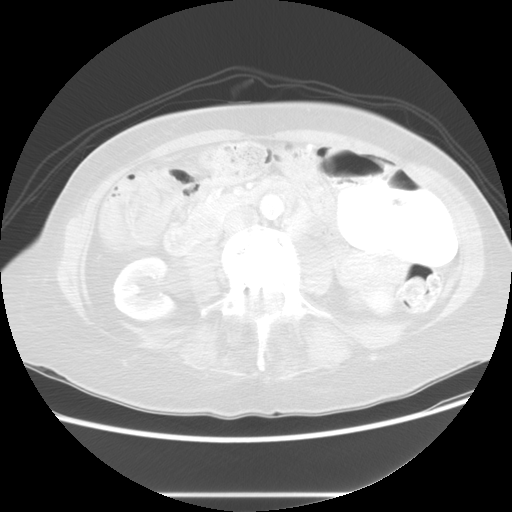
[im 52/84  soft-tissue]
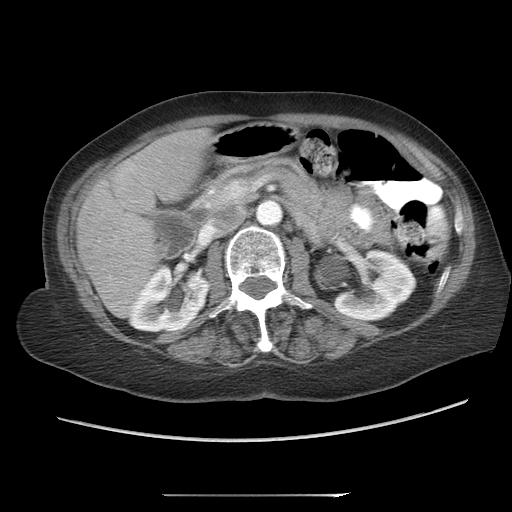
[im 52/84  lung]
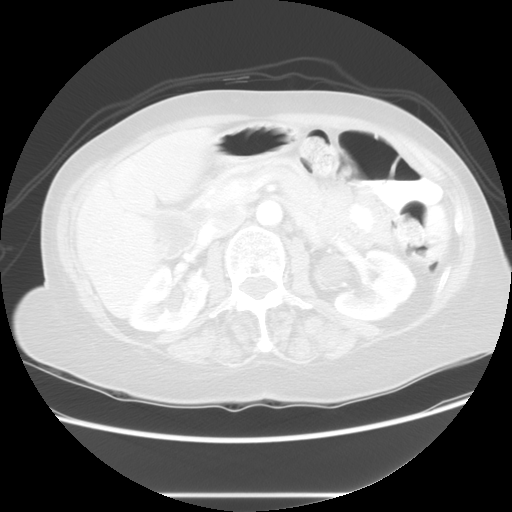
[im 63/84  soft-tissue]
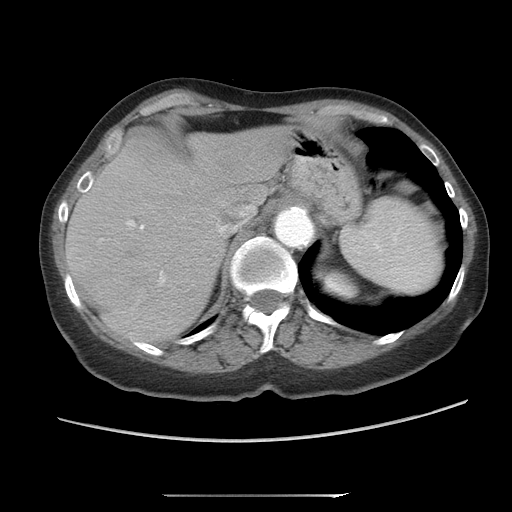
[im 63/84  lung]
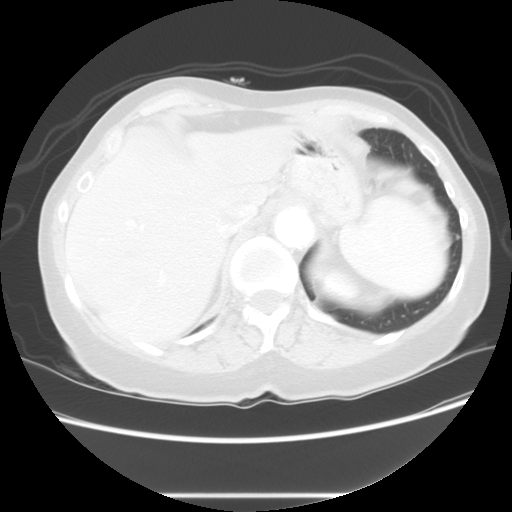
[im 73/84  soft-tissue]
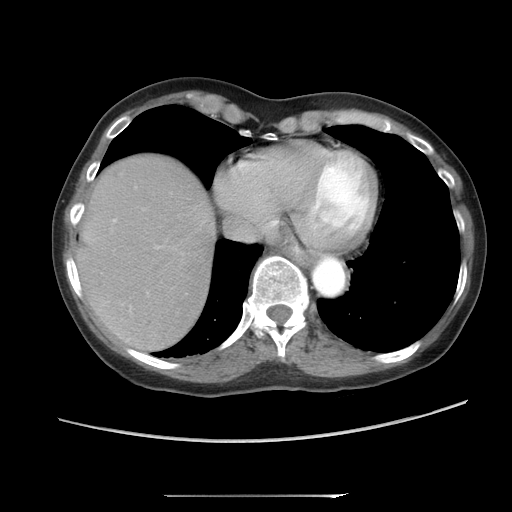
[im 73/84  lung]
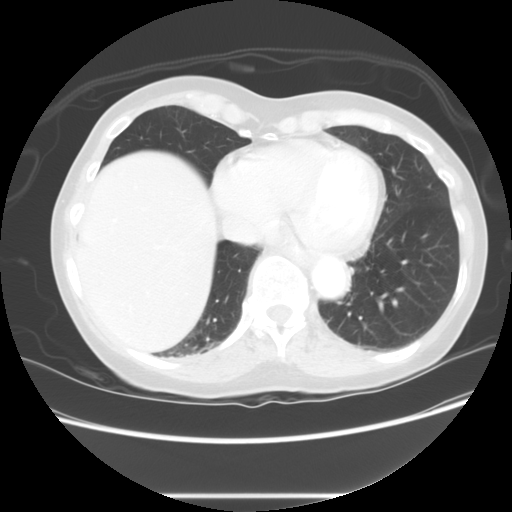

[Series 601: coronal body · coronal · 0.90mm/px · 1 of 114 slices shown, 2 images]
[im 38/114  soft-tissue]
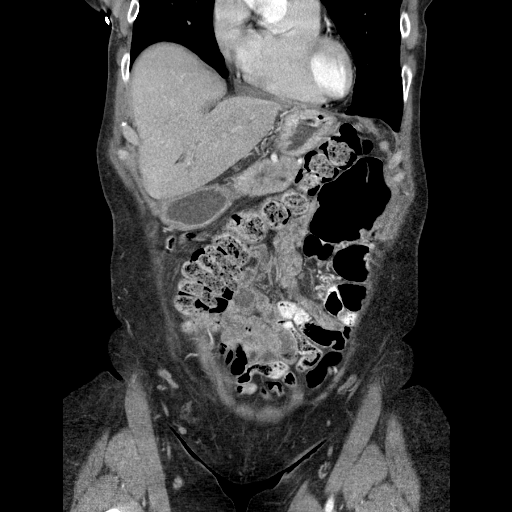
[im 38/114  bone]
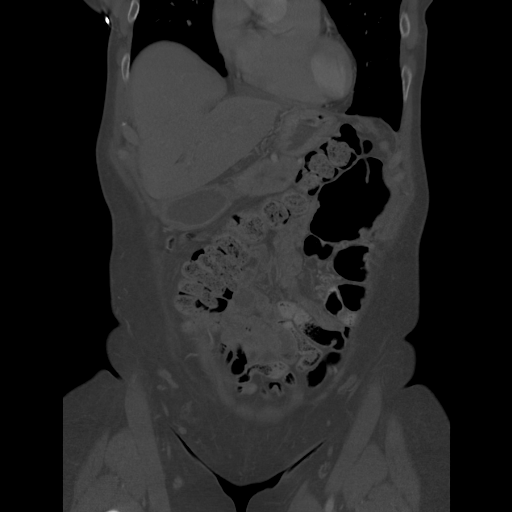

[Series 602: sagittal body · sagittal · 0.90mm/px · 4 of 149 slices shown]
[im 10/149  soft-tissue]
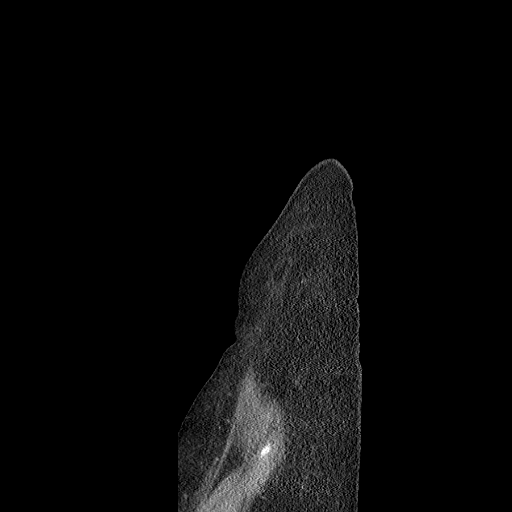
[im 30/149  soft-tissue]
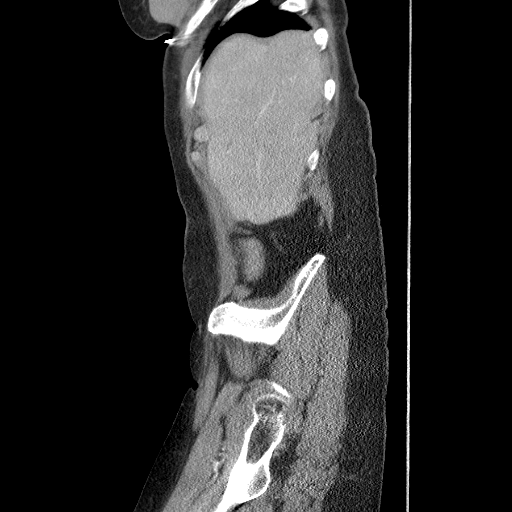
[im 50/149  soft-tissue]
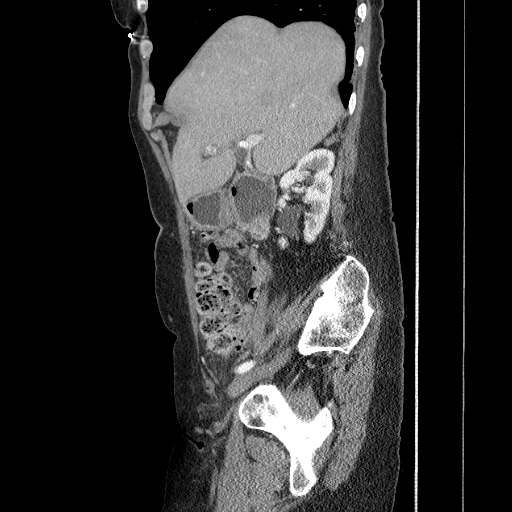
[im 70/149  soft-tissue]
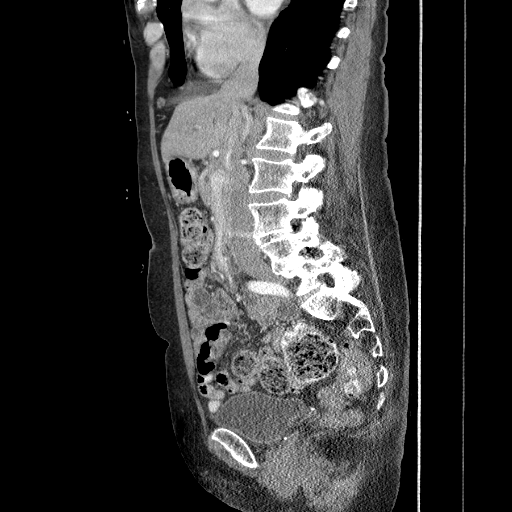

[12 of 36 positions shown; findings below may reference images not displayed]

FINDINGS: The lung bases are clear. There is dense breast tissue
bilaterally.  The liver enhances with no focal abnormality and no
ductal dilatation is seen.  No calcified gallstones are seen.  The
common bile duct is slightly prominent measuring 7.2 mm, and better
seen on coronal images there is suggestion of soft tissue defect
within the distal common bile duct.  This could represent
noncalcified gallstones or soft tissue mass and correlation with
LFTs is recommended.  The pancreas is normal in size and the
pancreatic duct is slightly prominent measuring 3.2 mm in diameter.
The adrenal glands and spleen are unremarkable.  The stomach is not
well distended.  A probable complex exophytic cyst emanates from
the upper posterolateral left kidney measuring 2.0 cm in diameter.
No other renal lesion is seen and on delayed images, the
pelvocaliceal systems are unremarkable with extrarenal pelves
bilaterally.  The abdominal aorta is normal in caliber with
atheromatous change present.  No adenopathy is seen.

The urinary bladder is not well distended but is unremarkable.  The
uterus appears to have been resected.  No adnexal lesion is seen.
No fluid is noted within the pelvis.  There are a few scattered
rectosigmoid colonic diverticula present.  No other abnormality of
the colon is seen.
IMPRESSION: 1.  Slightly prominent common bile duct with questionable filling
defects within the distal common bile duct.  Cannot exclude distal
common bile duct noncalcified calculi or mass.  Correlate with
liver function tests.
2.  Slightly prominent pancreatic duct.

## 2013-06-19 IMAGING — US US ABDOMEN COMPLETE
1 series · 13 of 25 positions shown · non-contrast
Comparison: CT abdomen pelvis - 01/31/2012

CLINICAL DATA: Epigastric pain, possible filling defect within the
distal aspect of the CBD seen on prior abdominal CT.

COMPLETE ABDOMINAL ULTRASOUND

[Series 1: us abdomen complete · 0.25mm/px · 13 of 73 slices shown]
[im 1/73]
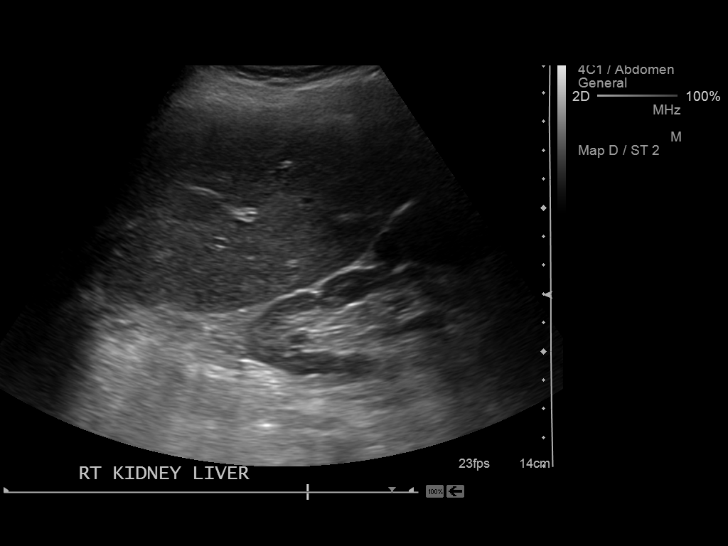
[im 7/73]
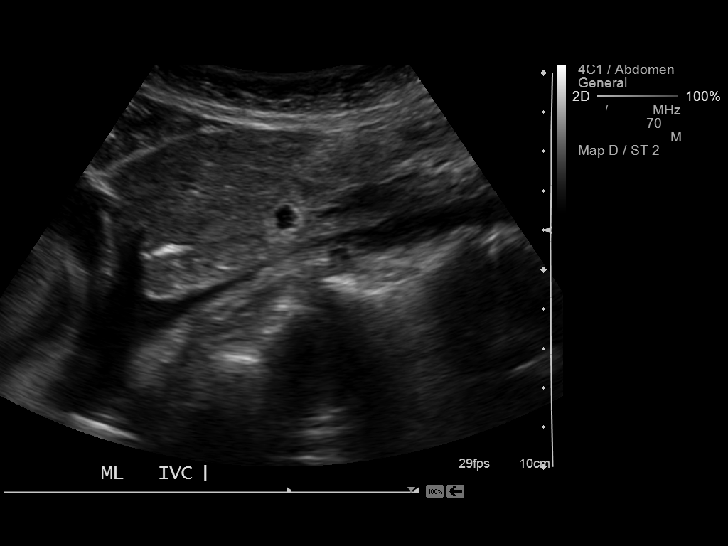
[im 13/73]
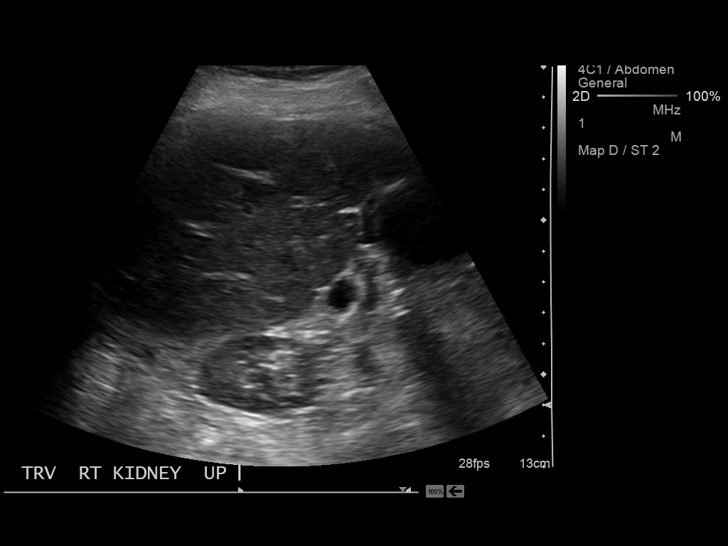
[im 19/73]
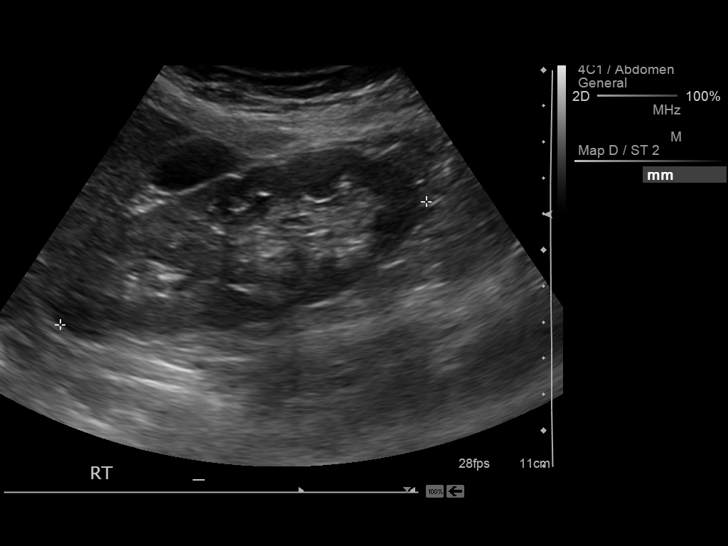
[im 25/73]
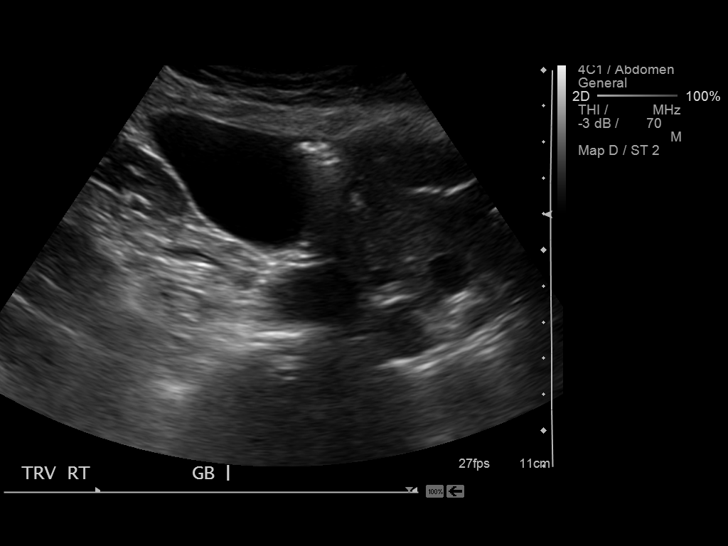
[im 31/73]
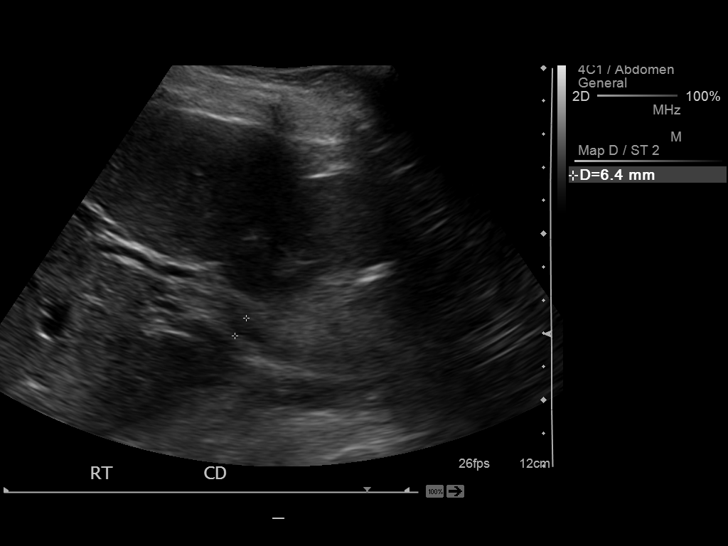
[im 37/73]
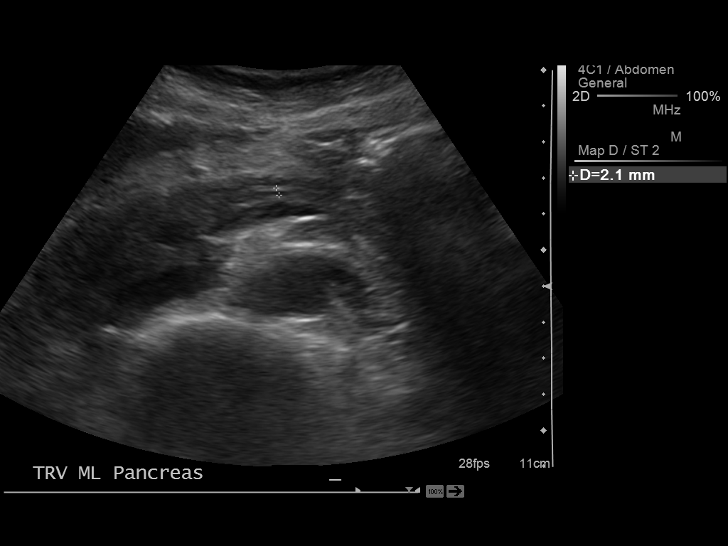
[im 43/73]
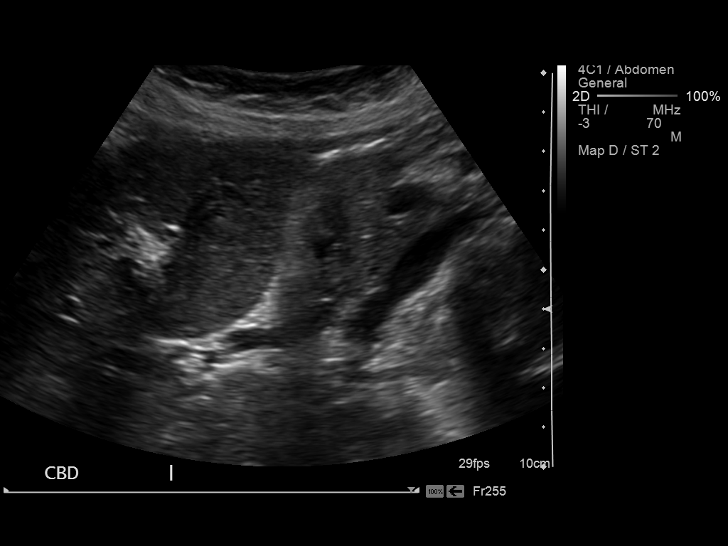
[im 49/73]
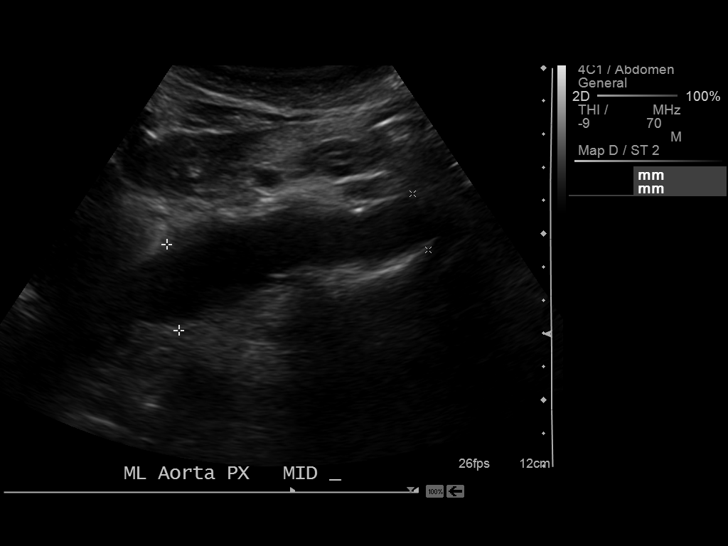
[im 55/73]
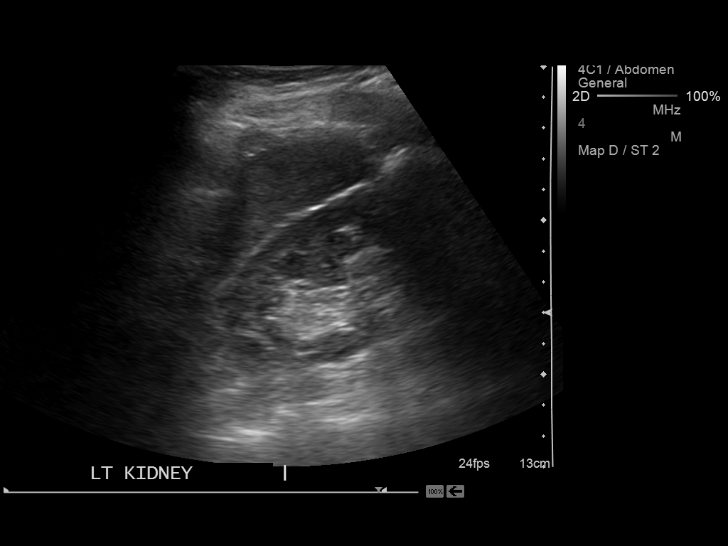
[im 61/73]
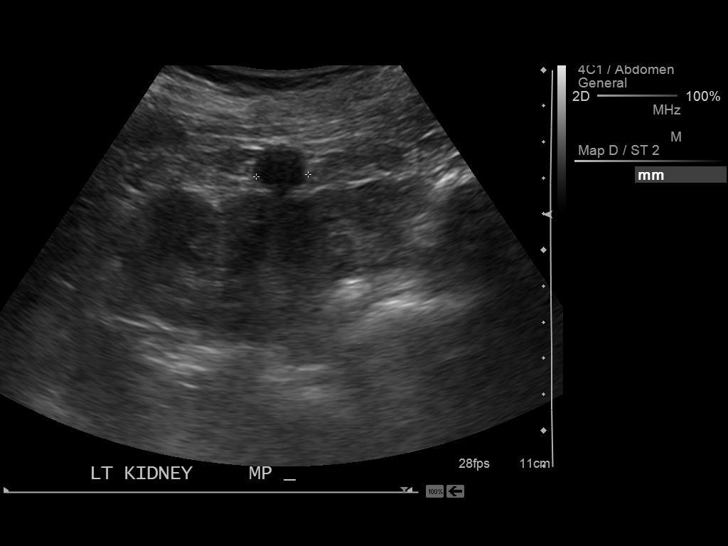
[im 67/73]
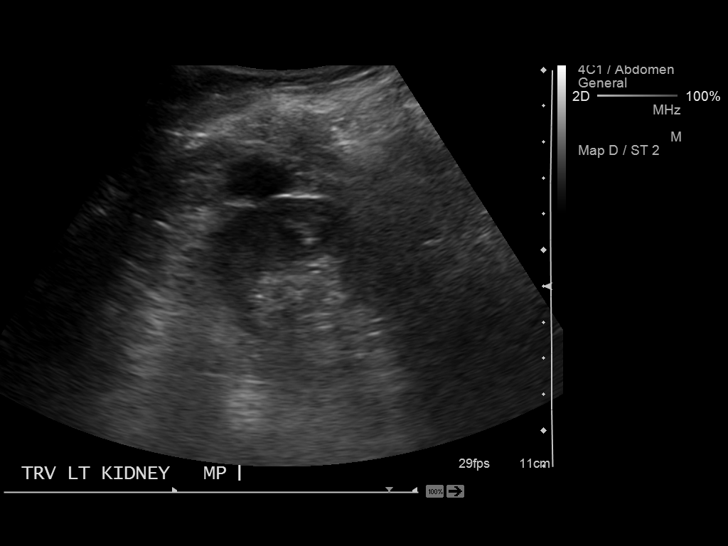
[im 73/73]
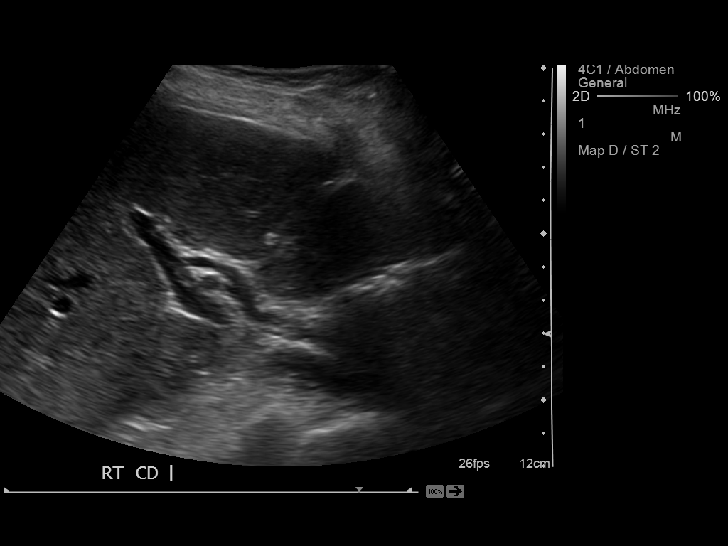

[13 of 25 positions shown; findings below may reference images not displayed]

FINDINGS: Gallbladder:  Sonographically normal.  No echogenic gallstones or
gall sludge.  No gallbladder wall thickening or pericholecystic
fluid.  Negative sonographic Murphy's sign.

Common bile duct:  Normal in size for age measuring 7 mm in
diameter

Liver:  Homogeneous hepatic echotexture.  No discrete hepatic
lesions.  No definite evidence of intrahepatic biliary ductal
dilatation.  No ascites.

IVC:  Appears normal.

Pancreas:  Limited visualization of the pancreatic head and neck is
normal.  Visualization of the pancreatic body and tail is obscured
by bowel gas. Normal caliber of the pancreatic duct as seen at the
level of the neck/body junction.  Previously identified mild
dilatation of the central aspect of the pancreatic duct is not well
visualized on this examination.

Spleen:  Normal in size measuring 4.6 cm in length.

Right Kidney:  Normal cortical thickness, echogenicity and size,
measuring 10.7 cm in length.  No focal renal lesions.  No echogenic
renal stones.  No urinary obstruction.

Left Kidney:  Normal cortical thickness, echogenicity and size,
measuring 11.5 cm in length.  There is an 1.4 cm exophytic anechoic
lesion arising from the mid aspect of the left kidney which is well
defined borders and likely represents a renal cyst as was
demonstrated on prior contrast enhanced abdominal CT.  No echogenic
renal stones.  No urinary obstruction.

Abdominal aorta:  No aneurysm identified.
IMPRESSION: 1.  No evidence of cholelithiasis.
2.  Previously identified possible filling defect of the distal
aspect of the CBD and associated mild dilatation of the central
aspect of the pancreatic duct are not well evaluated on this
examination.  Further evaluation with MRCP may be performed as
clinically indicated.

## 2013-06-29 ENCOUNTER — Ambulatory Visit (AMBULATORY_SURGERY_CENTER): Payer: Medicare PPO | Admitting: Gastroenterology

## 2013-06-29 ENCOUNTER — Encounter: Payer: Self-pay | Admitting: Gastroenterology

## 2013-06-29 VITALS — BP 135/86 | HR 234 | Temp 96.7°F | Resp 32 | Ht 67.0 in | Wt 173.0 lb

## 2013-06-29 DIAGNOSIS — T18128D Food in esophagus causing other injury, subsequent encounter: Secondary | ICD-10-CM

## 2013-06-29 DIAGNOSIS — R1310 Dysphagia, unspecified: Secondary | ICD-10-CM

## 2013-06-29 DIAGNOSIS — K2970 Gastritis, unspecified, without bleeding: Secondary | ICD-10-CM

## 2013-06-29 DIAGNOSIS — D130 Benign neoplasm of esophagus: Secondary | ICD-10-CM

## 2013-06-29 DIAGNOSIS — Z5189 Encounter for other specified aftercare: Secondary | ICD-10-CM

## 2013-06-29 DIAGNOSIS — K222 Esophageal obstruction: Secondary | ICD-10-CM

## 2013-06-29 MED ORDER — SODIUM CHLORIDE 0.9 % IV SOLN: 500.0000 mL | INTRAVENOUS | Status: DC

## 2013-06-29 NOTE — Patient Instructions (Addendum)
YOU HAD AN ENDOSCOPIC PROCEDURE TODAY AT THE [facility]: Refer to the procedure report that was given to you for any specific questions about what was found during the examination.  If the procedure report does not answer your questions, please call your gastroenterologist to clarify.  If you requested that your care partner not be given the details of your procedure findings, then the procedure report has been included in a sealed envelope for you to review at your convenience later.  YOU SHOULD EXPECT: Some feelings of bloating in the abdomen. Passage of more gas than usual.  Walking can help get rid of the air that was put into your GI tract during the procedure and reduce the bloating. If you had a lower endoscopy (such as a colonoscopy or flexible sigmoidoscopy) you may notice spotting of blood in your stool or on the toilet paper. If you underwent a bowel prep for your procedure, then you may not have a normal bowel movement for a few days.  DIET:   Drink plenty of fluids but you should avoid alcoholic beverages for 24 hours.  Please follow the dilatation diet the rest of the day.  ACTIVITY: Your care partner should take you home directly after the procedure.  You should plan to take it easy, moving slowly for the rest of the day.  You can resume normal activity the day after the procedure however you should NOT DRIVE or use heavy machinery for 24 hours (because of the sedation medicines used during the test).    SYMPTOMS TO REPORT IMMEDIATELY: A gastroenterologist can be reached at any hour.  During normal business hours, 8:30 AM to 5:00 PM Monday through Friday, call [phone].  After hours and on weekends, please call the GI answering service at [phone] who will take a message and have the physician on call contact you.     Following upper endoscopy (EGD)  Vomiting of blood or coffee ground material  New chest pain or pain under the shoulder blades  Painful or  persistently difficult swallowing  New shortness of breath  Fever of 100F or higher  Black, tarry-looking stools  FOLLOW UP: If any biopsies were taken you will be contacted by phone or by letter within the next 1-3 weeks.  Call your gastroenterologist if you have not heard about the biopsies in 3 weeks.  Our staff will call the home number listed on your records the next business day following your procedure to check on you and address any questions or concerns that you may have at that time regarding the information given to you following your procedure. This is a courtesy call and so if there is no answer at the home number and we have not heard from you through the emergency physician on call, we will assume that you have returned to your regular daily activities without incident.  SIGNATURES/CONFIDENTIALITY: You and/or your care partner have signed paperwork which will be entered into your electronic medical record.  These signatures attest to the fact that that the information above on your After Visit Summary has been reviewed and is understood.  Full responsibility of the confidentiality of this discharge information lies with you and/or your care-partner.    A handout was given to your friend on a dilatation diet to follow the rest of the day. You may resume your xarelto today per Dr. Jarold Motto along with all your other current medications.  Dr. Jarold Motto would like to add OTC Prilosec 20 mg  once each am 20 - 30 minutes before your breakfast long term. Please call if any questions or concerns.

## 2013-06-29 NOTE — Progress Notes (Signed)
Called to room to assist during endoscopic procedure.  Patient ID and intended procedure confirmed with present staff. Received instructions for my participation in the procedure from the performing physician.  

## 2013-06-29 NOTE — Op Note (Signed)
 [facility] [address] [place], [address]   ENDOSCOPY PROCEDURE REPORT  PATIENT: Melinda [name], Melinda [name]  MR#: [id number] BIRTHDATE: 1941/06/21 , 72  yrs. old GENDER: Female ENDOSCOPIST:Idona Stach Hale Bogus, MD, [facility] REFERRED BY: PROCEDURE DATE:  06/29/2013 PROCEDURE:   EGD w/ biopsy for H.pylori, EGD w/ biopsy , and Maloney dilation of esophagus ASA CLASS:    Class III INDICATIONS: Dysphagia. MEDICATION: propofol (Diprivan) 200mg  IV TOPICAL ANESTHETIC:  DESCRIPTION OF PROCEDURE:   After the risks and benefits of the procedure were explained, informed consent was obtained.  The LB [id number]  endoscope was introduced through the mouth  and advanced to the second portion of the duodenum .  The instrument was slowly withdrawn as the mucosa was fully examined.      DUODENUM: The duodenal mucosa showed no abnormalities.  STOMACH: There was mild antral gastropathy noted.  Cold forcep biopsies were taken at the antrum.  ESOPHAGUS: A stricture was found in the upper third of the esophagus.  The stenosis was traversable with the endoscope. Multiple biopsies were performed using cold forceps.  Sample obtained to rule out Barrett's esophagus.    Retroflexed views revealed no abnormalities.    The scope was then withdrawn from the patient and the procedure completed.  COMPLICATIONS: There were no complications.   ENDOSCOPIC IMPRESSION: 1.   The duodenal mucosa showed no abnormalities 2.   There was mild antral gastropathy noted [T2] ...r/o H.pylori 3. Stricture was found in the upper third of the esophagus; multiple biopsies .Marland KitchenMarland Kitchen"[place]" and recurrent proxima stricture in the esophagus,biopsies done...  RECOMMENDATIONS: 1.  Await pathology results 2.  Continue PPI.Marland KitchenMarland KitchenPriloses OTC daily    _______________________________ eSigned:  Mardella Layman, MD, [facility] 06/29/2013 10:11 AM   standard discharge   PATIENT NAME:  Melinda [name], Melinda [name] MR#:  [id number]

## 2013-06-29 NOTE — Progress Notes (Signed)
Procedure ends, to recovery, report given and VSS. 

## 2013-06-29 NOTE — Progress Notes (Signed)
No complaints noted in the recovery room. Maw   

## 2013-06-30 ENCOUNTER — Encounter: Payer: Self-pay | Admitting: Gastroenterology

## 2013-06-30 ENCOUNTER — Telehealth: Payer: Self-pay

## 2013-06-30 LAB — HELICOBACTER PYLORI SCREEN-BIOPSY: UREASE: NEGATIVE

## 2013-06-30 NOTE — Telephone Encounter (Signed)
Reached recording, no vms to leave message

## 2013-07-01 ENCOUNTER — Telehealth: Payer: Self-pay | Admitting: Gastroenterology

## 2013-07-01 NOTE — Telephone Encounter (Signed)
Pt's daughter who has HIPPA clearance, called to ask which bx were done and why. Daughter Irving Burton was not able to be at the procedure. We discussed the procedure and possible bx and she stated understanding. She would like to be called when path is back.

## 2013-07-03 ENCOUNTER — Telehealth: Payer: Self-pay | Admitting: Gastroenterology

## 2013-07-03 ENCOUNTER — Encounter: Payer: Self-pay | Admitting: Gastroenterology

## 2013-07-03 NOTE — Telephone Encounter (Signed)
Pt seen 06/09/13 with Willette Cluster, NP  f/u to food impaction by Dr Russella Dar. Pt with hx of esophageal strictures, dysphagia, and frequent food impactions.  Pt had an EGD on 06/29/13 with bx that are benign and H. Pylori -. Spoke with pt who states she's been nauseated x 1 month; this does not sem to be related to her procedure. She states her throat is sore also. States she had an infection a couple of years ago and Dr Valentina Lucks gave her something that cleared it up. Informed pt I do not think this is r/t her procedure since she's had nausea x 1 month. Requested she call Dr Jone Baseman ofc and if they won't give her something call back; pt stated understanding.

## 2013-07-06 ENCOUNTER — Telehealth: Payer: Self-pay | Admitting: *Deleted

## 2013-07-06 NOTE — Telephone Encounter (Signed)
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

## 2013-07-06 NOTE — Telephone Encounter (Signed)
Informed pt's daughter, Irving Burton of Dr Norval Gable orders/findings. She has begun an OTC PPI and pt will see Dr Valentina Lucks in 2 weeks and ask him to draw the labs.

## 2013-07-06 NOTE — Telephone Encounter (Signed)
lmom for daughter to call back

## 2013-07-06 NOTE — Telephone Encounter (Signed)
Dr Jarold Motto, I sorry, I cannot find in your notes if you dilated pt's stricture; I know you took BXs. Pt's daughter is calling me about what was done since she lives in [place]. Did you dilate and if not, other than chewing slowly and smaller bites, what should the pt do to prevent more problems? Thanks.

## 2013-07-06 NOTE — Telephone Encounter (Signed)
Message copied by Florene Glen on Mon Jul 06, 2013 10:45 AM ------      Message from: Florene Glen      Created: Wed Jul 01, 2013  1:55 PM       Call daughter with path results ------

## 2013-08-28 ENCOUNTER — Ambulatory Visit: Payer: Medicare PPO | Admitting: Physician Assistant

## 2013-08-28 ENCOUNTER — Telehealth: Payer: Self-pay | Admitting: Gastroenterology

## 2013-08-28 NOTE — Telephone Encounter (Signed)
Patient states her throat has been sore since her procedure in December. States for the last 2 days, she is having difficulty swallowing. Scheduled with Nicoletta Ba, PA today at 2:30 PM.

## 2013-09-01 ENCOUNTER — Ambulatory Visit: Payer: Medicare PPO | Admitting: Physician Assistant

## 2013-09-03 ENCOUNTER — Telehealth: Payer: Self-pay | Admitting: Neurology

## 2013-09-03 MED ORDER — TOPIRAMATE 50 MG PO TABS: 50.0000 mg | ORAL_TABLET | Freq: Two times a day (BID) | ORAL | Status: DC

## 2013-09-03 NOTE — Telephone Encounter (Signed)
I called the patients ins.  She currently takes two 25mg  tabs (50mg  total) twice daily.  They indicate they will not pay for this dose unless it's fr temporary titration because the drug is available in 50mg  tabs.  If we prescribe 50mg  tabs one twice daily (total dose remains the same) they will pay for the medication.  I contacted the pharmacy and they said 50mg  bid #60 does go through ins for $7 co-pay.  I called the patient.  Explained she would now be taking one 50mg  twice daily rather than two 25mg  twice daily.  She verbalized understanding.  She is also aware her co-pay is $7 according to the pharmacy.

## 2013-09-03 NOTE — Telephone Encounter (Signed)
Pt called.  She stated that [facility] will no longer pay for Topiramate.  She asked if she could get the same medicine under another name or what could be done.  She is completely out of the medication and will need the new prescription to go to the [facility] on [address].

## 2013-09-10 ENCOUNTER — Ambulatory Visit: Payer: Medicare PPO | Admitting: Physician Assistant

## 2013-09-15 ENCOUNTER — Encounter: Payer: Self-pay | Admitting: Physician Assistant

## 2013-09-15 ENCOUNTER — Telehealth: Payer: Self-pay | Admitting: *Deleted

## 2013-09-15 ENCOUNTER — Other Ambulatory Visit: Payer: Self-pay | Admitting: *Deleted

## 2013-09-15 ENCOUNTER — Ambulatory Visit (INDEPENDENT_AMBULATORY_CARE_PROVIDER_SITE_OTHER): Payer: Medicare PPO | Admitting: Physician Assistant

## 2013-09-15 VITALS — BP 130/70 | HR 70 | Ht 67.0 in | Wt 179.4 lb

## 2013-09-15 DIAGNOSIS — Z7901 Long term (current) use of anticoagulants: Secondary | ICD-10-CM

## 2013-09-15 DIAGNOSIS — K222 Esophageal obstruction: Secondary | ICD-10-CM

## 2013-09-15 DIAGNOSIS — R1319 Other dysphagia: Secondary | ICD-10-CM

## 2013-09-15 NOTE — Progress Notes (Signed)
Reviewed and agree with management. Melinda [name] be sure to schedule EGD with Savary dilatation with fluoroscopy at the hospital Jovan Schickling D. Deatra Ina, M.D., [facility]

## 2013-09-15 NOTE — Patient Instructions (Signed)
You have been scheduled for an endoscopy with propofol. Please follow written instructions given to you at your visit today. If you use inhalers (even only as needed), please bring them with you on the day of your procedure. 

## 2013-09-15 NOTE — Telephone Encounter (Signed)
  09/15/2013   RE: Melinda [name] DOB: 09/29/1940 MRN: [id number]   Dear Dr. Candee Furbish,    We have scheduled the above patient for an endoscopic procedure. Our records show that she is on anticoagulation therapy.   Please advise as to how long the patient may come off her therapy of Xarelto prior to the procedure, which is scheduled for 10-01-2013.  Please fax back/ or route the completed form to [place] at [phone].   Sincerely,    Amy Esterwood PA-C    Marguetta Windish NCMA

## 2013-09-15 NOTE — Telephone Encounter (Signed)
Please stop Xarelto 2 days prior to endoscopy.  Thank you

## 2013-09-15 NOTE — Progress Notes (Signed)
Subjective:    Patient ID: Melinda [name], female    DOB: 1940-09-15, 73 y.o.   MRN: [id number]  HPI Melinda [name] is a pleasant 72 year old white female previously known to Dr. Sharlett Iles. She had been undergoing evaluation for dysphagia and had an episode of food impaction requiring emergency EGD in November of 2014. She underwent repeat EGD 06/29/2013 with Dr. Sharlett Iles was noted to have a mild antral gastropathy and a stricture in the upper third of the esophagus which was traversable with the endoscope. Biopsies were taken to rule out Barrett's and no dilation was done. Biopsies showed findings consistent with an inlet patch no intestinal metaplasia dysplasia or malignancy.  Patient comes back today stating that her swallowing is a little bit better since this procedure but that she still having trouble particularly with pills. She says every time she takes her pills they feels that they get hung up in her esophagus. She has to drink a lot of fluids to get him to gradually go down. She's not having any trouble swallowing liquids generally but has been avoiding meat completely due  to fear that she will have another food impaction.  Other medical problems include history of atrial fibrillation for which she is on Xarelto. She is followed by Dr. Lacie Draft cardiology. 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 She was told may be fairly rapidly progressive. She is being followed by neurology and says she has done very well and pleased that her symptoms have not progressed.    Review of Systems  Constitutional: Negative.   HENT: Positive for trouble swallowing.   Eyes: Negative.   Respiratory: Negative.   Gastrointestinal: Negative.   Endocrine: Negative.   Genitourinary: Negative.   Musculoskeletal: Negative.   Allergic/Immunologic: Negative.   Neurological: Positive for tremors.  Hematological: Negative.   Psychiatric/Behavioral: Negative.     Outpatient Prescriptions Prior to Visit  Medication Sig Dispense Refill  . buPROPion (WELLBUTRIN XL) 150 MG 24 hr tablet Take 450 mg by mouth every morning.       . Calcium Carbonate-Vitamin D (CALTRATE 600+D PO) Take 1 tablet by mouth daily.      . clonazePAM (KLONOPIN) 0.5 MG tablet Take 0.5 mg by mouth at bedtime as needed. For sleep      . estradiol (ESTRACE) 2 MG tablet Take 2 mg by mouth daily.      . flecainide (TAMBOCOR) 100 MG tablet Take 1 tablet (100 mg total) by mouth 2 (two) times daily.  60 tablet  6  . fludrocortisone (FLORINEF) 0.1 MG tablet Take 0.3 mg by mouth daily.      . hydrOXYzine (VISTARIL) 25 MG capsule Take 25 mg by mouth at bedtime.       Marland Kitchen levothyroxine (SYNTHROID, LEVOTHROID) 75 MCG tablet Take 75 mcg by mouth daily.      . metoprolol succinate (TOPROL-XL) 25 MG 24 hr tablet Take 50 mg by mouth 2 (two) times daily.       . midodrine (PROAMATINE) 10 MG tablet Take 10 mg by mouth 3 (three) times daily.      Marland Kitchen PARoxetine (PAXIL) 30 MG tablet Take 60 mg by mouth every morning.      . potassium chloride 20 MEQ/15ML (10%) solution Take 20 mEq by mouth daily. As directed      . Rivaroxaban (XARELTO) 20 MG TABS Take 1 tablet (20 mg total) by mouth daily with supper.  30 tablet  11  . topiramate (TOPAMAX)  50 MG tablet Take 1 tablet (50 mg total) by mouth 2 (two) times daily.  60 tablet  6   No facility-administered medications prior to visit.   Allergies  Allergen Reactions  . 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 Pollen Extract   . Ace Inhibitors   . Sinemet [Carbidopa-Levodopa]     Hypotension    Patient Active Problem List   Diagnosis Date Noted  . Food impaction of esophagus 06/08/2013  . Multiple system atrophy with bradykinesia   . Atrial fibrillation with rapid ventricular response   . History of edema   . Allergic rhinitis   . Osteopenia   . Cancer   . Sinusitis   . Dysrhythmia   . Thyroid disease   . 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 Parkinson disease 02/12/2013  . 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 Atrial fibrillation 01/29/2013  . Recurrent right inguinal hernia 10/20/2012  . Orthostatic hypotension 07/23/2012  . Dysuria 07/23/2012  . Right inguinal hernia 06/03/2012  . Atrial fibrillation with RVR 01/31/2012  . Dilated cbd, acquired 01/31/2012  . Hypothyroidism 01/31/2012  . Anxiety 01/31/2012  . DEPRESSION 12/09/2009  . ESOPHAGEAL STRICTURE 12/09/2009  . DYSPHAGIA UNSPECIFIED 12/09/2009   History  Substance Use Topics  . Smoking status: Former Smoker -- 0.50 packs/day for 20 years    Types: Cigarettes    Quit date: 07/23/1992  . Smokeless tobacco: Never Used     Comment: quit 15 years ago  . Alcohol Use: No      family history includes Anxiety disorder in her daughter and son; Migraines in her daughter and mother. There is no history of Colon cancer or Stomach cancer.  Objective:   Physical Exam   well-developed older white female in no acute distress, anxious blood pressure 130/70 pulse 70 height 5 foot 7 weight 179. HEENT; nontraumatic ,normocephalic EOMI PERRLA sclera anicteric Parkinson's like facies, Neck ;supple no JVD, Cardiovascular; regular rate and rhythm with S1-S2 no murmur or gallop, Pulmonary; clear bilaterally, Abdomen; soft nondistended nontender no palpable mass or hepatosplenomegaly bowel sounds are present, Rectal; exam not done, Extremities ;slight resting tremor, Psych; mood and affect appropriate      Assessment & Plan:  #41   73 year old female with proximal esophageal stricture with history of previous dilation. She had a food impaction in November of 2014 and EGD with biopsies December 2014- no dilation done. Patient still symptomatic with solid food dysphagia particularly to meat and pills.  #2 chronic anticoagulation with Xarelto  #3 atrial for relation  #4 multisystem atrophy/Parkinson's variant #5 hypothyroidism   Plan;  patient will be scheduled for upper endoscopy with esophageal dilation with Dr. Deatra Ina. Procedure was discussed in detail with the patient she is agreeable to proceed She will need to come off Xarelto for least 24 hours prior to the procedure and we will obtain consent from her cardiologist Dr. Etter Sjogren. Continue Prilosec 20 mg by mouth every morning

## 2013-09-16 ENCOUNTER — Telehealth: Payer: Self-pay | Admitting: Physician Assistant

## 2013-09-16 NOTE — Telephone Encounter (Signed)
Spoke with patient's daughter Melinda [name]. She states her mother has complained with a "sore throat" since her procedure 3 months ago. She was concerned that her mother had not told Nicoletta Ba, PA about this. Did not see this in the note. Discussed that if pills are not "going down easily" this could cause some irritation that would cause throat to be sore. Daughter also concerned that the swallowing issues might be related to mother's dx of multisystem atrophy. Please, advise.

## 2013-09-16 NOTE — Telephone Encounter (Signed)
Patient's daughter notified of Nicoletta Ba, [place] recommendation.

## 2013-09-16 NOTE — Telephone Encounter (Signed)
could be contributing .Marland Kitchen However she has a documented stricture in esophagus that need to be dilated- and most of her sxs are to solids which is consistent with a stricture.

## 2013-09-16 NOTE — Telephone Encounter (Signed)
Left a message for Melinda [name] to call me.

## 2013-09-16 NOTE — Telephone Encounter (Signed)
Left a message for [name] to call me.

## 2013-09-17 NOTE — Telephone Encounter (Signed)
I called [phone] and left a message for Jeanine Luz, the patient's  daughter.  I advised her mother can stop the Xarelto on 3-17 and resume it on 10-02-2013,  I asked that she please let her mother know this information.I asked her to please call me if she has any questions.

## 2013-09-21 ENCOUNTER — Telehealth: Payer: Self-pay | Admitting: Neurology

## 2013-09-21 NOTE — Telephone Encounter (Signed)
Pt called would like for Dr. 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 Please call pt back concerning this matter.

## 2013-09-21 NOTE — Telephone Encounter (Signed)
Pt called in and wanted to make sure that Dr. Brett Fairy saw the following study results. [email or web address] is where they can be found.  Pt is not able to print it out otherwise she would have mailed a copy for Dr. Brett Fairy to review.  Thank you.

## 2013-09-21 NOTE — Telephone Encounter (Signed)
Patient was informed that Dr. Brett Fairy is out of the office until 10/07/2013 and she wants to have her message given to her at that time.

## 2013-09-22 ENCOUNTER — Telehealth: Payer: Self-pay | Admitting: *Deleted

## 2013-09-22 NOTE — Telephone Encounter (Signed)
Called and left another message for daughter Jeanine Luz.  I advised her Mother can stop the Xarelto on 09-29-2013 and resume it on 10-02-2013.

## 2013-10-01 ENCOUNTER — Encounter: Payer: Self-pay | Admitting: Gastroenterology

## 2013-10-01 ENCOUNTER — Ambulatory Visit (AMBULATORY_SURGERY_CENTER): Payer: Medicare PPO | Admitting: Gastroenterology

## 2013-10-01 VITALS — BP 149/96 | HR 61 | Temp 96.8°F | Resp 25 | Ht 67.0 in | Wt 179.0 lb

## 2013-10-01 DIAGNOSIS — R1319 Other dysphagia: Secondary | ICD-10-CM

## 2013-10-01 DIAGNOSIS — K222 Esophageal obstruction: Secondary | ICD-10-CM

## 2013-10-01 MED ORDER — SODIUM CHLORIDE 0.9 % IV SOLN: 500.0000 mL | INTRAVENOUS | Status: DC

## 2013-10-01 NOTE — Progress Notes (Signed)
Stable to RR 

## 2013-10-01 NOTE — Patient Instructions (Addendum)
Resume xarelto in amYOU HAD AN ENDOSCOPIC PROCEDURE TODAY AT [place]: Refer to the procedure report that was given to you for any specific questions about what was found during the examination.  If the procedure report does not answer your questions, please call your gastroenterologist to clarify.  If you requested that your care partner not be given the details of your procedure findings, then the procedure report has been included in a sealed envelope for you to review at your convenience later.  YOU SHOULD EXPECT: Some feelings of bloating in the abdomen. Passage of more gas than usual.  Walking can help get rid of the air that was put into your GI tract during the procedure and reduce the bloating. If you had a lower endoscopy (such as a colonoscopy or flexible sigmoidoscopy) you may notice spotting of blood in your stool or on the toilet paper. If you underwent a bowel prep for your procedure, then you may not have a normal bowel movement for a few days.  DIET:  You may have a drink of water at 5:30pm.  If that is tolerated, you may have a soft diet for the rest of the evening.  No alcohol for 24 hours.  ACTIVITY: Your care partner should take you home directly after the procedure.  You should plan to take it easy, moving slowly for the rest of the day.  You can resume normal activity the day after the procedure however you should NOT DRIVE or use heavy machinery for 24 hours (because of the sedation medicines used during the test).    SYMPTOMS TO REPORT IMMEDIATELY: A gastroenterologist can be reached at any hour.  During normal business hours, 8:30 AM to 5:00 PM Monday through Friday, call [phone].  After hours and on weekends, please call the GI answering service at [phone] who will take a message and have the physician on call contact you.   Following upper endoscopy (EGD)  Vomiting of blood or coffee ground material  New chest pain or pain under the shoulder  blades  Painful or persistently difficult swallowing  New shortness of breath  Fever of 100F or higher  Black, tarry-looking stools  FOLLOW UP: If any biopsies were taken you will be contacted by phone or by letter within the next 1-3 weeks.  Call your gastroenterologist if you have not heard about the biopsies in 3 weeks.  Our staff will call the home number listed on your records the next business day following your procedure to check on you and address any questions or concerns that you may have at that time regarding the information given to you following your procedure. This is a courtesy call and so if there is no answer at the home number and we have not heard from you through the emergency physician on call, we will assume that you have returned to your regular daily activities without incident.  SIGNATURES/CONFIDENTIALITY: You and/or your care partner have signed paperwork which will be entered into your electronic medical record.  These signatures attest to the fact that that the information above on your After Visit Summary has been reviewed and is understood.  Full responsibility of the confidentiality of this discharge information lies with you and/or your care-partner.  BEGIN YOUR XARELTO IN THE MORNING.

## 2013-10-01 NOTE — Progress Notes (Signed)
Called to room to assist during endoscopic procedure.  Patient ID and intended procedure confirmed with present staff. Received instructions for my participation in the procedure from the performing physician.  

## 2013-10-01 NOTE — Op Note (Signed)
[facility]  [address] [place], [address]   ENDOSCOPY PROCEDURE REPORT  PATIENT: Melinda [name], Melinda [name]  MR#: [id number] BIRTHDATE: Sep 18, 1940 , 72  yrs. old GENDER: Female ENDOSCOPIST: Inda Castle, MD REFERRED BY:  Lavone Orn, M.D. PROCEDURE DATE:  10/01/2013 PROCEDURE:  EGD, diagnostic and Maloney dilation of esophagus ASA CLASS:     Class II INDICATIONS:  Dysphagia. MEDICATIONS: MAC sedation, administered by CRNA, Propofol (Diprivan) 80 mg IV, and Simethicone 0.6cc PO TOPICAL ANESTHETIC:  DESCRIPTION OF PROCEDURE: After the risks benefits and alternatives of the procedure were thoroughly explained, informed consent was obtained.  The LB [id number] endoscope was introduced through the mouth and advanced to the third portion of the duodenum. Without limitations.  The instrument was slowly withdrawn as the mucosa was fully examined.      A moderate stricture was identified at the GE junction.  The 9 mm gastroscope easily traversed the stricture.   A moderate stricture was identified at the GE junction.  The 9 mm gastroscope easily traversed the stricture.   The remainder of the upper endoscopy exam was otherwise normal.  Retroflexed views revealed no abnormalities.     The scope was then withdrawn from the patient and the procedure completed.  A #52 Isabell Jarvis dilator was passed with moderate resistance.  There was no heme.  COMPLICATIONS: There were no complications.  ENDOSCOPIC IMPRESSION: esophageal stricture-status post Venia Minks dilation  RECOMMENDATIONS:  Resume xarelto in am REPEAT EXAM:  eSigned:  Inda Castle, MD 10/01/2013 3:49 PM   CC:

## 2013-10-02 ENCOUNTER — Telehealth: Payer: Self-pay

## 2013-10-02 NOTE — Telephone Encounter (Signed)
No answer, left vm 

## 2013-10-06 NOTE — Telephone Encounter (Signed)
The telephone note was given to Dr. Brett Fairy to address.

## 2013-10-14 ENCOUNTER — Telehealth: Payer: Self-pay | Admitting: Neurology

## 2013-10-14 NOTE — Telephone Encounter (Signed)
Pt called.  She has an appointment scheduled for 11-24-13 @ 3:00 with Dr. Brett Fairy.  She states she is very hard of hearing and is not always able to understand Dr. Edwena Felty accent.  She asked if a nurse or assistant can be present in the room to help her interpret.  Normally she has a family member come with her but at this time she says none of them are available.  Please contact her to discuss.  She states that morning hours are best for return calls.  Thank you

## 2013-10-14 NOTE — Telephone Encounter (Signed)
Sent to clinical assistant for Dr Brett Fairy

## 2013-10-15 NOTE — Telephone Encounter (Signed)
Per Dr. Brett Fairy that will be find to have someone in the room to make sure she understands everything that is being said.

## 2013-10-19 NOTE — Telephone Encounter (Signed)
Spoke to [employer] , who would like to discuss the trial medication on May 12th, during her appointment. I explained that 2 medications have recently been discussed as helping in MSA>  duodopa , a gelform is one of those.

## 2013-10-28 ENCOUNTER — Other Ambulatory Visit: Payer: Self-pay | Admitting: Neurology

## 2013-11-21 ENCOUNTER — Emergency Department (HOSPITAL_COMMUNITY)
Admission: EM | Admit: 2013-11-21 | Discharge: 2013-11-21 | Disposition: A | Discharge status: Home / Self Care | Payer: Medicare PPO | Attending: Emergency Medicine | Admitting: Emergency Medicine

## 2013-11-21 ENCOUNTER — Emergency Department (HOSPITAL_COMMUNITY): Payer: Medicare PPO

## 2013-11-21 DIAGNOSIS — Z7901 Long term (current) use of anticoagulants: Secondary | ICD-10-CM | POA: Insufficient documentation

## 2013-11-21 DIAGNOSIS — M129 Arthropathy, unspecified: Secondary | ICD-10-CM | POA: Insufficient documentation

## 2013-11-21 DIAGNOSIS — M899 Disorder of bone, unspecified: Secondary | ICD-10-CM | POA: Insufficient documentation

## 2013-11-21 DIAGNOSIS — Z9889 Other specified postprocedural states: Secondary | ICD-10-CM | POA: Insufficient documentation

## 2013-11-21 DIAGNOSIS — Z8781 Personal history of (healed) traumatic fracture: Secondary | ICD-10-CM | POA: Insufficient documentation

## 2013-11-21 DIAGNOSIS — G238 Other specified degenerative diseases of basal ganglia: Secondary | ICD-10-CM | POA: Insufficient documentation

## 2013-11-21 DIAGNOSIS — E039 Hypothyroidism, unspecified: Secondary | ICD-10-CM | POA: Insufficient documentation

## 2013-11-21 DIAGNOSIS — F411 Generalized anxiety disorder: Secondary | ICD-10-CM | POA: Insufficient documentation

## 2013-11-21 DIAGNOSIS — K529 Noninfective gastroenteritis and colitis, unspecified: Secondary | ICD-10-CM

## 2013-11-21 DIAGNOSIS — R112 Nausea with vomiting, unspecified: Secondary | ICD-10-CM

## 2013-11-21 DIAGNOSIS — Z79899 Other long term (current) drug therapy: Secondary | ICD-10-CM | POA: Insufficient documentation

## 2013-11-21 DIAGNOSIS — F3289 Other specified depressive episodes: Secondary | ICD-10-CM | POA: Insufficient documentation

## 2013-11-21 DIAGNOSIS — F329 Major depressive disorder, single episode, unspecified: Secondary | ICD-10-CM | POA: Insufficient documentation

## 2013-11-21 DIAGNOSIS — M949 Disorder of cartilage, unspecified: Secondary | ICD-10-CM

## 2013-11-21 DIAGNOSIS — Z8619 Personal history of other infectious and parasitic diseases: Secondary | ICD-10-CM | POA: Insufficient documentation

## 2013-11-21 DIAGNOSIS — Z90710 Acquired absence of both cervix and uterus: Secondary | ICD-10-CM | POA: Insufficient documentation

## 2013-11-21 DIAGNOSIS — Z8709 Personal history of other diseases of the respiratory system: Secondary | ICD-10-CM | POA: Insufficient documentation

## 2013-11-21 DIAGNOSIS — R109 Unspecified abdominal pain: Secondary | ICD-10-CM

## 2013-11-21 DIAGNOSIS — H9190 Unspecified hearing loss, unspecified ear: Secondary | ICD-10-CM | POA: Insufficient documentation

## 2013-11-21 DIAGNOSIS — I4891 Unspecified atrial fibrillation: Secondary | ICD-10-CM | POA: Insufficient documentation

## 2013-11-21 DIAGNOSIS — Z8541 Personal history of malignant neoplasm of cervix uteri: Secondary | ICD-10-CM | POA: Insufficient documentation

## 2013-11-21 DIAGNOSIS — Z8742 Personal history of other diseases of the female genital tract: Secondary | ICD-10-CM | POA: Insufficient documentation

## 2013-11-21 DIAGNOSIS — K5289 Other specified noninfective gastroenteritis and colitis: Secondary | ICD-10-CM | POA: Insufficient documentation

## 2013-11-21 DIAGNOSIS — Z87891 Personal history of nicotine dependence: Secondary | ICD-10-CM | POA: Insufficient documentation

## 2013-11-21 LAB — COMPREHENSIVE METABOLIC PANEL
ALK PHOS: 106 U/L (ref 39–117)
ALT: 15 U/L (ref 0–35)
AST: 22 U/L (ref 0–37)
Albumin: 4.2 g/dL (ref 3.5–5.2)
BUN: 24 mg/dL — AB (ref 6–23)
CHLORIDE: 102 meq/L (ref 96–112)
CO2: 22 mEq/L (ref 19–32)
CREATININE: 0.98 mg/dL (ref 0.50–1.10)
Calcium: 10.3 mg/dL (ref 8.4–10.5)
GFR calc Af Amer: 66 mL/min — ABNORMAL LOW (ref >90)
GFR, EST NON AFRICAN AMERICAN: 57 mL/min — AB (ref >90)
Glucose, Bld: 139 mg/dL — ABNORMAL HIGH (ref 70–99)
Potassium: 4.2 mEq/L (ref 3.7–5.3)
Sodium: 141 mEq/L (ref 137–147)
Total Bilirubin: 0.4 mg/dL (ref 0.3–1.2)
Total Protein: 8.1 g/dL (ref 6.0–8.3)

## 2013-11-21 LAB — URINE MICROSCOPIC-ADD ON

## 2013-11-21 LAB — CBC WITH DIFFERENTIAL/PLATELET
Basophils Absolute: 0 10*3/uL (ref 0.0–0.1)
Basophils Relative: 0 % (ref 0–1)
Eosinophils Absolute: 0 10*3/uL (ref 0.0–0.7)
Eosinophils Relative: 0 % (ref 0–5)
HCT: 51 % — ABNORMAL HIGH (ref 36.0–46.0)
HEMOGLOBIN: 17.3 g/dL — AB (ref 12.0–15.0)
LYMPHS PCT: 13 % (ref 12–46)
Lymphs Abs: 1.7 10*3/uL (ref 0.7–4.0)
MCH: 33 pg (ref 26.0–34.0)
MCHC: 33.9 g/dL (ref 30.0–36.0)
MCV: 97.1 fL (ref 78.0–100.0)
MONO ABS: 0.9 10*3/uL (ref 0.1–1.0)
MONOS PCT: 7 % (ref 3–12)
NEUTROS ABS: 10.8 10*3/uL — AB (ref 1.7–7.7)
Neutrophils Relative %: 81 % — ABNORMAL HIGH (ref 43–77)
Platelets: 289 10*3/uL (ref 150–400)
RBC: 5.25 MIL/uL — ABNORMAL HIGH (ref 3.87–5.11)
RDW: 14.8 % (ref 11.5–15.5)
WBC: 13.4 10*3/uL — AB (ref 4.0–10.5)

## 2013-11-21 LAB — URINALYSIS, ROUTINE W REFLEX MICROSCOPIC
Bilirubin Urine: NEGATIVE
Glucose, UA: NEGATIVE mg/dL
KETONES UR: NEGATIVE mg/dL
Nitrite: NEGATIVE
Protein, ur: 30 mg/dL — AB
Specific Gravity, Urine: 1.017 (ref 1.005–1.030)
UROBILINOGEN UA: 0.2 mg/dL (ref 0.0–1.0)
pH: 7 (ref 5.0–8.0)

## 2013-11-21 MED ORDER — MORPHINE SULFATE 2 MG/ML IJ SOLN
2.0000 mg | Freq: Once | INTRAMUSCULAR | Status: AC
  Administered 2013-11-21: 2 mg via INTRAVENOUS
  Filled 2013-11-21: qty 1

## 2013-11-21 MED ORDER — TRAMADOL HCL 50 MG PO TABS: 50.0000 mg | ORAL_TABLET | Freq: Four times a day (QID) | ORAL | Status: AC | PRN

## 2013-11-21 MED ORDER — ONDANSETRON 8 MG PO TBDP
8.0000 mg | ORAL_TABLET | Freq: Once | ORAL | Status: AC
  Administered 2013-11-21: 8 mg via ORAL
  Filled 2013-11-21: qty 1

## 2013-11-21 MED ORDER — ONDANSETRON HCL 4 MG/2ML IJ SOLN
4.0000 mg | Freq: Once | INTRAMUSCULAR | Status: AC
  Administered 2013-11-21: 4 mg via INTRAVENOUS
  Filled 2013-11-21: qty 2

## 2013-11-21 MED ORDER — SODIUM CHLORIDE 0.9 % IV BOLUS (SEPSIS)
1000.0000 mL | Freq: Once | INTRAVENOUS | Status: AC
Start: 2013-11-21 — End: 2013-11-21
  Administered 2013-11-21: 1000 mL via INTRAVENOUS

## 2013-11-21 MED ORDER — ONDANSETRON HCL 4 MG/2ML IJ SOLN
4.0000 mg | Freq: Once | INTRAMUSCULAR | Status: AC
Start: 2013-11-21 — End: 2013-11-21
  Administered 2013-11-21: 4 mg via INTRAVENOUS
  Filled 2013-11-21: qty 2

## 2013-11-21 MED ORDER — TRAMADOL HCL 50 MG PO TABS
50.0000 mg | ORAL_TABLET | Freq: Once | ORAL | Status: AC
  Administered 2013-11-21: 50 mg via ORAL
  Filled 2013-11-21: qty 1

## 2013-11-21 MED ORDER — ONDANSETRON HCL 4 MG PO TABS: 4.0000 mg | ORAL_TABLET | Freq: Four times a day (QID) | ORAL | Status: AC

## 2013-11-21 MED ORDER — IOHEXOL 300 MG/ML  SOLN
100.0000 mL | Freq: Once | INTRAMUSCULAR | Status: AC | PRN
  Administered 2013-11-21: 100 mL via INTRAVENOUS

## 2013-11-21 MED ORDER — IOHEXOL 300 MG/ML  SOLN
50.0000 mL | Freq: Once | INTRAMUSCULAR | Status: AC | PRN
  Administered 2013-11-21: 50 mL via ORAL

## 2013-11-21 NOTE — ED Notes (Addendum)
Per PA, pt to return home by [facility] d/t hx of dizziness and parkinson's

## 2013-11-21 NOTE — ED Notes (Signed)
Per EMS, patient from home, lives alone, began having abd/flank pain 2-3 hours ago, now has nausea & vomiting, also c/o dizziness and weakness. Patient reports to EMS she is concerned she is throwing up blood, patient ate a sweet potato at dinner, EMS states this is what it appears patient is vomiting.

## 2013-11-21 NOTE — ED Notes (Signed)
[facility] called to transport pt back to residence 

## 2013-11-21 NOTE — ED Provider Notes (Signed)
6:15 AM Pt signed out by Gertha Calkin, PA-C at shift change. Plan is to f/u on CT abdomen, if unremarkable, pt may be discharged home with ultram and zofran.   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   Discussed imaging with Dr. Lebron Conners.  Pt states she feels comfortable being discharged home. States pain and nausea have improved. Abdomen-soft, non-distended, mild diffuse tenderness.  Advised pt to f/u PCP, Dr. Laurann Montana, as needed for ongoing healthcare needs. Return precautions provided. Pt verbalized understanding and agreement with tx plan.     Noland Fordyce, PA-C 11/21/13 0800  Noland Fordyce, PA-C 11/21/13 [phone]

## 2013-11-21 NOTE — ED Provider Notes (Signed)
CSN: [id number]     Arrival date & time 11/21/13  0210 History   First MD Initiated Contact with Patient 11/21/13 [phone]     Chief Complaint  Patient presents with  . Nausea and Vomiting      (Consider location/radiation/quality/duration/timing/severity/associated sxs/prior Treatment) HPI History provided by pt.   Pt presents w/ lower abdominal pain that started at 6pm yesterday and has been constant since.  Worst at RLQ.  "It feels like a fart".  Became associated w/ N/V and possibly hematemesis at 1am today. Denies fever, anorexia, change in bowels, GU sx.  Has never had pain like this nor hematemesis in the past.  Past abd surgeries include total hysterectomy.  Past Medical History  Diagnosis Date  . Depression   . Anxiety   . Thyroid disease   . Hearing loss   . PONV (postoperative nausea and vomiting)   . Arthritis   . Hypothyroidism   . Tremors of nervous system   . Dysrhythmia     brief rapid afib 01/2012;   john griffin  md  . Orthostatic hypotension   . Sinusitis   . Cancer     uterus removed in 1974  . 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 Osteopenia   . Allergic rhinitis   . Acute low back pain with possible spinal stenosis of less than six weeks' duration   . Cervical intraepithelial neoplasia   . Ovarian cyst   . Bilateral hearing loss   . Rib fractures   . Metatarsalgia of right foot   . Helicobacter pylori gastritis   . Atrial fibrillation with rapid ventricular response   . Multiple system atrophy with bradykinesia    Past Surgical History  Procedure Laterality Date  . Back surgery    . Abdominal hysterectomy    . Breast enhancement surgery    . Hammer toe surgery    . Inguinal hernia repair  06/27/2012    Procedure: HERNIA REPAIR INGUINAL ADULT;  Surgeon: Harl Bowie, MD;  Location: [facility];  Service: General;  Laterality: Right;  . Insertion of mesh  06/27/2012    Procedure:  INSERTION OF MESH;  Surgeon: Harl Bowie, MD;  Location: [facility];  Service: General;  Laterality: Right;  . Hernia repair    . Inguinal hernia repair Right 11/17/2012    Procedure: REPAIR RIGHT RECURRENT INGUINAL HERNIA;  Surgeon: Harl Bowie, MD;  Location: [facility];  Service: General;  Laterality: Right;  . Insertion of mesh Right 11/17/2012    Procedure: INSERTION OF MESH;  Surgeon: Harl Bowie, MD;  Location: [facility];  Service: General;  Laterality: Right;  . Tonsillectomy    . Foot surgery Left   . Ganglion cyst excision    . Breast enhancement surgery    . Toe surgery Right   . Rih    . Esophagogastroduodenoscopy N/A 06/08/2013    Procedure: ESOPHAGOGASTRODUODENOSCOPY (EGD);  Surgeon: Ladene Artist, MD;  Location: [facility];  Service: Endoscopy;  Laterality: N/A;   Family History  Problem Relation Age of Onset  . Migraines Mother   . Anxiety disorder Son   . Migraines Daughter   . Anxiety disorder Daughter   . Colon cancer Neg Hx   . Stomach cancer Neg Hx    History  Substance Use Topics  . Smoking status: Former Smoker -- 0.50 packs/day for 20 years    Types: Cigarettes  Quit date: 07/23/1992  . Smokeless tobacco: Never Used     Comment: quit 15 years ago  . Alcohol Use: No   OB History   Grav Para Term Preterm Abortions TAB SAB Ect Mult Living                 Review of Systems  All other systems reviewed and are negative.     Allergies  Other; Pollen extract; Ace inhibitors; and Sinemet  Home Medications   Prior to Admission medications   Medication Sig Start Date End Date Taking? Authorizing Provider  buPROPion (WELLBUTRIN XL) 150 MG 24 hr tablet Take 450 mg by mouth every morning.    Yes Historical Provider, MD  Calcium Carbonate-Vitamin D (CALTRATE 600+D PO) Take 1 tablet by mouth daily.   Yes Historical Provider, MD  clonazePAM (KLONOPIN) 0.5 MG tablet Take 0.5 mg by mouth at bedtime as needed. For sleep   Yes Historical Provider, MD   estradiol (ESTRACE) 2 MG tablet Take 2 mg by mouth daily.   Yes Historical Provider, MD  flecainide (TAMBOCOR) 100 MG tablet Take 100 mg by mouth 2 (two) times daily.   Yes Historical Provider, MD  hydrOXYzine (VISTARIL) 25 MG capsule Take 25 mg by mouth at bedtime.  11/14/12  Yes Historical Provider, MD  levothyroxine (SYNTHROID, LEVOTHROID) 75 MCG tablet Take 75 mcg by mouth daily.   Yes Historical Provider, MD  metoprolol succinate (TOPROL-XL) 25 MG 24 hr tablet Take 50 mg by mouth 2 (two) times daily.    Yes Historical Provider, MD  PARoxetine (PAXIL) 30 MG tablet Take 60 mg by mouth every morning.   Yes Historical Provider, MD  potassium chloride 20 MEQ/15ML (10%) solution Take 20 mEq by mouth daily.   Yes Historical Provider, MD  Rivaroxaban (XARELTO) 20 MG TABS Take 1 tablet (20 mg total) by mouth daily with supper. 07/24/12  Yes Irven Shelling, MD  topiramate (TOPAMAX) 50 MG tablet Take 1 tablet (50 mg total) by mouth 2 (two) times daily. 09/03/13  Yes Carmen Dohmeier, MD   BP 164/95  Pulse 82  Temp(Src) 97.9 F (36.6 C) (Oral)  Resp 30  SpO2 96% Physical Exam  Nursing note and vitals reviewed. Constitutional: She is oriented to person, place, and time. She appears well-developed and well-nourished. No distress.  HENT:  Head: Normocephalic and atraumatic.  Eyes:  Normal appearance  Neck: Normal range of motion.  Cardiovascular: Normal rate and regular rhythm.   Pulmonary/Chest: Effort normal and breath sounds normal. No respiratory distress.  Abdominal: Soft. Bowel sounds are normal. She exhibits no distension and no mass. There is no rebound and no guarding.  Moderate RLQ and mild mid-line lower abd ttp.  Dark red emesis in bag.  Very sweet smell.  Pt reports drinking cranberry juice at 9pm last night.  Genitourinary:  No CVA tenderness  Musculoskeletal: Normal range of motion.  Neurological: She is alert and oriented to person, place, and time.  Skin: Skin is warm and dry.  No rash noted.  Psychiatric: She has a normal mood and affect. Her behavior is normal.    ED Course  Procedures (including critical care time) Labs Review Labs Reviewed  CBC WITH DIFFERENTIAL - Abnormal; Notable for the following:    WBC 13.4 (*)    RBC 5.25 (*)    Hemoglobin 17.3 (*)    HCT 51.0 (*)    Neutrophils Relative % 81 (*)    Neutro Abs 10.8 (*)  All other components within normal limits  COMPREHENSIVE METABOLIC PANEL - Abnormal; Notable for the following:    Glucose, Bld 139 (*)    BUN 24 (*)    GFR calc non Af Amer 57 (*)    GFR calc Af Amer 66 (*)    All other components within normal limits  URINALYSIS, ROUTINE W REFLEX MICROSCOPIC - Abnormal; Notable for the following:    APPearance CLOUDY (*)    Hgb urine dipstick TRACE (*)    Protein, ur 30 (*)    Leukocytes, UA TRACE (*)    All other components within normal limits  URINE MICROSCOPIC-ADD ON - Abnormal; Notable for the following:    Squamous Epithelial / LPF FEW (*)    Bacteria, UA FEW (*)    All other components within normal limits    Imaging Review No results found.   EKG Interpretation None      MDM   Final diagnoses:  None   73yo F presents w/ c/o lower abd pain and N/V.  On exam, afebrile, NAD and non-toxic appearing, abd soft/non-distended, RLQ ttp w/ guarding or rebount.  Labs and CT abd/pelvis pending.  IVF, morphine and zofran ordered for sx.  4:34 AM   Dr. Florina Ou has seen patient, her sx are improved and she is otherwise stable at this time.  Labs sig for mild leukocytosis.  U/A sent for culture.  Hilda Blades, PA-C to resume care. 5:59 AM   Remer Macho, PA-C 11/21/13 [phone]

## 2013-11-21 NOTE — Discharge Instructions (Signed)
Be sure to follow up with Dr. Laurann Montana, let him know you were seen today in the ER so he can obtain your medical records as needed. Return to ER for NEW or worsening symptoms, including severe abdominal pain, or unable to keep down fluids.  Abdominal Pain, Adult Many things can cause belly (abdominal) pain. Most times, the belly pain is not dangerous. Many cases of belly pain can be watched and treated at home. HOME CARE   Do not take medicines that help you go poop (laxatives) unless told to by your doctor.  Only take medicine as told by your doctor.  Eat or drink as told by your doctor. Your doctor will tell you if you should be on a special diet. GET HELP IF:  You do not know what is causing your belly pain.  You have belly pain while you are sick to your stomach (nauseous) or have runny poop (diarrhea).  You have pain while you pee or poop.  Your belly pain wakes you up at night.  You have belly pain that gets worse or better when you eat.  You have belly pain that gets worse when you eat fatty foods. GET HELP RIGHT AWAY IF:   The pain does not go away within 2 hours.  You have a fever.  You keep throwing up (vomiting).  The pain changes and is only in the right or left part of the belly.  You have bloody or tarry looking poop. MAKE SURE YOU:   Understand these instructions.  Will watch your condition.  Will get help right away if you are not doing well or get worse. Document Released: 12/19/2007 Document Revised: 04/22/2013 Document Reviewed: 03/11/2013 [facility] Patient Information 2014 [place].

## 2013-11-21 NOTE — ED Notes (Signed)
Bed: WA20 Expected date:  Expected time:  Means of arrival:  Comments: EMS 73F ABD PAIN

## 2013-11-21 NOTE — ED Provider Notes (Signed)
Medical screening examination/treatment/procedure(s) were conducted as a shared visit with non-physician practitioner(s) and myself.  I personally evaluated the patient during the encounter.  5:50 AM Patient feeling much better after IV medications. She is no longer nauseated. She still having right lower quadrant pain. Right lower quadrant is tender. Abdomen is soft.    Wynetta Fines, MD 11/21/13 [phone]

## 2013-11-22 LAB — URINE CULTURE

## 2013-11-24 ENCOUNTER — Encounter: Payer: Self-pay | Admitting: Neurology

## 2013-11-24 ENCOUNTER — Ambulatory Visit (INDEPENDENT_AMBULATORY_CARE_PROVIDER_SITE_OTHER): Payer: Medicare PPO | Admitting: Neurology

## 2013-11-24 VITALS — BP 152/100 | HR 68 | Resp 18 | Ht 68.5 in | Wt 181.0 lb

## 2013-11-24 DIAGNOSIS — G238 Other specified degenerative diseases of basal ganglia: Secondary | ICD-10-CM

## 2013-11-24 DIAGNOSIS — G252 Other specified forms of tremor: Secondary | ICD-10-CM

## 2013-11-24 DIAGNOSIS — G232 Striatonigral degeneration: Secondary | ICD-10-CM

## 2013-11-24 DIAGNOSIS — G250 Essential tremor: Secondary | ICD-10-CM

## 2013-11-24 DIAGNOSIS — R251 Tremor, unspecified: Secondary | ICD-10-CM

## 2013-11-24 MED ORDER — CARBIDOPA-LEVODOPA 25-100 MG PO TABS: 1.0000 | ORAL_TABLET | Freq: Two times a day (BID) | ORAL | Status: AC

## 2013-11-24 MED ORDER — TOPIRAMATE 50 MG PO TABS: 50.0000 mg | ORAL_TABLET | Freq: Two times a day (BID) | ORAL | Status: AC

## 2013-11-24 MED ORDER — LOPERAMIDE HCL 2 MG PO CAPS: 2.0000 mg | ORAL_CAPSULE | ORAL | Status: AC | PRN

## 2013-11-24 NOTE — Progress Notes (Signed)
[facility]  Provider:  Larey Seat, M D  Referring Provider: Irven Shelling, MD Primary Care Physician:  Irven Shelling, MD  Chief Complaint  Patient presents with  . Follow-up    Room 10  . Neurologic Problem    HPI:  Melinda [name] is a 73 y.o. female  Is seen here as a referral/ revisit  from Dr. Laurann Montana after a recent second opinion visit at [facility] , Dr. Owens Shark.  Melinda [name] has been looking well and has been trying to find information about MSA and trials. The Rifampin trial is closed and was without success. The  dopamine effects on her were marginal. Her blood pressure has improved , now on Toprol alone. She needs orthostatic Blood pressures with very visit.   MSA is often clustered/ covered with Parkinson's disease.  She has a viral infection and became just last week end very dehydrated.   Refilled topamax. Loperamide, sinemet.     Past medical history: The patient was first seen on 04-23-2011 when she presented with a Tremor , rigidity, masked face ( less facial expression, and wide open , scared looking eyes) . She had a rare blink and her affect appears somewhat flat.  Noticed was also dysphonia and she endorsed a high level of fatigue. She denied a hallucinations and swallowing difficulties, was able to walk. 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  In early 2014  additional changes were clearly seen, She was unable to rise now or from a seated position without bracing herself.  Sitting on the exam table ,she was crossing her arms in front of her and  a significant tremor in her right hand was noticed  to a higher amplitude than in the left hand. This asymetry was concerning.  I also noticed in her visit in July 2013 , that the cogwheeling over the Biceps and rigidity over the wrists were now strong , and I initiated a  trial of brenztropin. The patient stated in June 2014, that the initial trial on  ARTANE  had made her sick - so she was at the time not feeling impaired enough to use another tremor medication, but now is worsening rapidly.  (Meanwhile they have been some medical developments,  the patient was diagnosed with atrial fibrillation. She remained on metoprolol for rate control , on flecainide, and on XERALTO for anticoagulation.  Patient and her [name] report both that Melinda [name] is easily very fatigued and  frequently lightheaded when standing up  or changing posture.  This proceeded the diagnosis of atrial fibrillation and has not been improved by the treatment of Atrial fibrillation.) There is a strong component of dysautonomia to her complains.   Failed artane - got sick, but developed more tremor.  We tried Sinemet tid , again she was subjectively reporting no improvement in tremor or rigidity.  I had ordered PT for gait stabilisation, OT and ST - she has a high risk- fall tendency  is propulsive and retropulsive.  This patient had more parkinsonian features to her movement disorder , when last seen in June , and today has further progressed. Her astonished , masked facial expression remains. She has full EOM.  There are  bluish discoloured feet and ankles and edema, with normal pulses .  Palmar erythema is mild, but muscle atrophy in fingers , thenar eminence and metacarpal is  more pronounced than I recall.  Patient was started on Topamax bid, increased recently to 50 mg bid, and had a good resolution of tremor. She is happier, and was impressed with Dr .Forestine Na at [facility]. She will continue with PT , and a walker.       Review of Systems: Out of a complete 14 system review, the patient complains of only the following symptoms, and all other reviewed systems are negative.   today endorsed neck stiffness- fatigue, tremor, unsteady gait and dizziness when changing posture , but at  times while already walking for a while.  No fever, nausea, dyphagia, pain. Nausea temporarily on Mestinon , which she discontinued.   History   Social History  . Marital Status: Divorced    Spouse Name: N/A    Number of Children: 2  . Years of Education: Masters   Occupational History  . retired    Social History Main Topics  . Smoking status: Former Smoker -- 0.50 packs/day for 20 years    Types: Cigarettes    Quit date: 07/23/1992  . Smokeless tobacco: Never Used     Comment: quit 15 years ago  . Alcohol Use: No  . Drug Use: No  . Sexual Activity: Not Currently   Other Topics Concern  . Not on file   Social History Narrative   Patient lives at home alone . Patient is single. Patient is retired.   Right handed.   College- masters.   Caffeine - two cups daily.    Family History  Problem Relation Age of Onset  . Migraines Mother   . Anxiety disorder Son   . Migraines [name]   . Anxiety disorder [name]   . Colon cancer Neg Hx   . Stomach cancer Neg Hx     Past Medical History  Diagnosis Date  . Depression   . Anxiety   . Thyroid disease   . Hearing loss   . PONV (postoperative nausea and vomiting)   . Arthritis   . Hypothyroidism   . Tremors of nervous system   . Dysrhythmia     brief rapid afib 01/2012;   john griffin  md  . Orthostatic hypotension   . Sinusitis   . Cancer     uterus removed in 1974  . 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 Osteopenia   . Allergic rhinitis   . Acute low back pain with possible spinal stenosis of less than six weeks' duration   . Cervical intraepithelial neoplasia   . Ovarian cyst   . Bilateral hearing loss   . Rib fractures   . Metatarsalgia of right foot   . Helicobacter pylori gastritis   . Atrial fibrillation with rapid ventricular response   . Multiple system atrophy with bradykinesia     Past Surgical History  Procedure Laterality  Date  . Back surgery    . Abdominal hysterectomy    . Breast enhancement surgery    . Hammer toe surgery    . Inguinal hernia repair  06/27/2012    Procedure: HERNIA REPAIR INGUINAL ADULT;  Surgeon: Shelly Rubenstein, MD;  Location: [facility] OR;  Service: General;  Laterality: Right;  . Insertion of mesh  06/27/2012    Procedure: INSERTION OF MESH;  Surgeon: Shelly Rubenstein, MD;  Location: [facility] OR;  Service: General;  Laterality: Right;  . Hernia repair    . Inguinal hernia repair Right  11/17/2012    Procedure: REPAIR RIGHT RECURRENT INGUINAL HERNIA;  Surgeon: Harl Bowie, MD;  Location: [facility];  Service: General;  Laterality: Right;  . Insertion of mesh Right 11/17/2012    Procedure: INSERTION OF MESH;  Surgeon: Harl Bowie, MD;  Location: [facility];  Service: General;  Laterality: Right;  . Tonsillectomy    . Foot surgery Left   . Ganglion cyst excision    . Breast enhancement surgery    . Toe surgery Right   . Rih    . Esophagogastroduodenoscopy N/A 06/08/2013    Procedure: ESOPHAGOGASTRODUODENOSCOPY (EGD);  Surgeon: Ladene Artist, MD;  Location: [facility];  Service: Endoscopy;  Laterality: N/A;    Current Outpatient Prescriptions  Medication Sig Dispense Refill  . buPROPion (WELLBUTRIN XL) 150 MG 24 hr tablet Take 450 mg by mouth every morning.       . Calcium Carbonate-Vitamin D (CALTRATE 600+D PO) Take 1 tablet by mouth daily.      . clonazePAM (KLONOPIN) 0.5 MG tablet Take 0.5 mg by mouth at bedtime as needed. For sleep      . estradiol (ESTRACE) 2 MG tablet Take 2 mg by mouth daily.      . flecainide (TAMBOCOR) 100 MG tablet Take 100 mg by mouth 2 (two) times daily.      . hydrOXYzine (VISTARIL) 25 MG capsule Take 25 mg by mouth at bedtime.       Marland Kitchen levothyroxine (SYNTHROID, LEVOTHROID) 75 MCG tablet Take 75 mcg by mouth daily.      . metoprolol succinate (TOPROL-XL) 25 MG 24 hr tablet Take 50 mg by mouth 2 (two) times daily.       . ondansetron (ZOFRAN) 4 MG tablet  Take 1 tablet (4 mg total) by mouth every 6 (six) hours.  12 tablet  0  . PARoxetine (PAXIL) 30 MG tablet Take 60 mg by mouth every morning.      . potassium chloride 20 MEQ/15ML (10%) solution Take 20 mEq by mouth daily.      . Rivaroxaban (XARELTO) 20 MG TABS Take 1 tablet (20 mg total) by mouth daily with supper.  30 tablet  11  . topiramate (TOPAMAX) 50 MG tablet Take 1 tablet (50 mg total) by mouth 2 (two) times daily.  60 tablet  6  . traMADol (ULTRAM) 50 MG tablet Take 1 tablet (50 mg total) by mouth every 6 (six) hours as needed.  15 tablet  0   No current facility-administered medications for this visit.    Allergies as of 11/24/2013 - Review Complete 11/24/2013  Allergen Reaction Noted  . Other Anaphylaxis 01/31/2012  . Pollen extract  03/11/2013  . Ace inhibitors  01/29/2013  . Sinemet [carbidopa-levodopa]  03/12/2013    Vitals: BP 152/100  Pulse 68  Resp 18  Ht 5' 8.5" (1.74 m)  Wt 181 lb (82.101 kg)  BMI 27.12 kg/m2 Last Weight:  Wt Readings from Last 1 Encounters:  11/24/13 181 lb (82.101 kg)   Last Height:   Ht Readings from Last 1 Encounters:  11/24/13 5' 8.5" (1.74 m)    Physical exam:  General: The patient is awake, alert and appears not in acute distress. The patient is well groomed. Head: Normocephalic, atraumatic.- masked face.  Neck is rigid - Cardiovascular:  Regular  Rhythm, 78 bpm , without  murmurs or carotid bruit, and without distended neck veins. Respiratory: Lungs are clear to auscultation. Skin:   Edema and discoloured skin , both feet  are bluish. palmar mild erythema  Trunk: hunched posture.   Neurologic exam : The patient is awake and alert, oriented to place and time.  Memory subjective  described as intact. There is a normal attention span & concentration ability.  Speech is fluent without dysarthria,  Mild dysphonia , not aphasia. Mood and affect are appropriate. GDS score was 6 points.   Cranial nerves: Pupils are equal and briskly  reactive to light. Funduscopic exam without  evidence of pallor or edema.  Rare Blink .  Wide open eyes, mimic and blink frequency are reduced . She also has right sided hearing loss.  Her speech always  is dysphonic Extraocular movements  in vertical and horizontal planes not smooth , but able to direct gaze without nystagmus.  Visual fields by finger perimetry are intact. Hearing to finger rub intact on the left.  Facial sensation intact to fine touch. Facial motor strength is symmetric and tongue and uvula move midline. No jaw tremor.  Motor exam:   The patient provides good strength in all 4 extremities there is a minor reduction of range of motion at the shoulder level, her neck appears stiff. Mimic and blink frequency are reduced she also has rather severe hearing loss. Her speech always  is dysphonic. tremor is right dominant.  Sensory:  Fine touch, pinprick and vibration were normal.   Coordination: Rapid alternating movements in the fingers/hands were tested and found to be slow . The patient provided strong grip, in spite of some  atrophic hand muscles. There were no fasciculations noted.   Finger-to-nose maneuver tested and normal without evidence of ataxia, dysmetria , and mild  Tremor today (1).  Gait and station: Patient walks without  assistance , she rose without bracing herself.  She  was able to resist and remain standing for the initial pull, but was unstable when pushed from the back. Her body appeared rigid , unable to provide the self- righting reflexes.    Strength within normal limits. Decreased arm swing but no pil- rolling tremor.  Stance ( open eyed)  is stable and normal.  She walked quickly , and turned with 3 steps left and to the right, stabilized, no drift.   Deep tendon reflexes: in the  upper and lower extremities are symmetric, BRISK,  Babinski maneuver response is downgoing.   Assessment:  After physical and neurologic examination :  1) MSA , parkinsonian  related syndrome, to be present.  The astonished facial expression and the skin discoloration are present, but not the restriction in gaze as seen in PSP. There is no evidence of dementia, but testing will be needed in future neurologic evaluations. These conditions require symptomatic treatment, and do not respond well to classic Parkinson's  disease medications.   I have refilled the TOPAMAX.     Melinda [name] wants her mother to move closer to her own [place] area home and I will contact the movement disorder specialists at [facility] to provide follow up care there.  The patient suffers from a progressive neurodegenerative disease and may need escalating care  In the future, I explained to Melinda [name] , that it would be beneficial  for her to find an assisted living arrangement  within a facility that can advance to more skilled care as needed.

## 2013-11-24 NOTE — Patient Instructions (Signed)
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 It is a progressive disorder of the central and sympathetic nervous systems. The disorder causes a large drop in blood pressure when the patient stands up. This leads to dizziness or momentary blackouts. There are three types of MSA:  Olivopontocerebellar atrophy (OPCA). This mostly affects balance, coordination, and speech.  A parkinsonian form (striatonigral degeneration). This can resemble Parkinson's disease because of slow movement and stiff muscles.  A mixed cerebellar and parkinsonian form. SYMPTOMS In all three forms of MSA, the patient can have dizziness or momentary blackouts. Symptoms that happen early in the course of the disease include:  Dizziness or momentary blackouts.  Constipation. This may be hard to manage.  Impotence in men.  Urinary incontinence. This syndrome may be hard to diagnose in the early stages. For most patients, blood pressure is low when the patient stands up. It is high when the patient lies down. Other symptoms that may develop include:  Impaired speech.  Difficulties with breathing and swallowing.  Inability to sweat. TREATMENT  Orthostatic hypotension in this syndrome is treatable. There is no known cure for the central nervous system degeneration. The general treatment course is aimed at controlling symptoms. These include:  Anti-parkinsonian medicine. L-dopa is an example.  To relieve dizziness, increased salt and fluid in the diet may help.  Medicine to elevate blood pressure is often needed. Salt-retaining steroids are an example. This medicine can cause side effects. It should be carefully monitored by a caregiver.  Alpha-adrenergic medications, non-steroidal anti-inflammatory drugs, and sympathomimetic amines are sometimes used.  Sleeping in a head-up position at night. This reduces morning dizziness.  An artificial feeding  tube or breathing tube may be surgically inserted. It is to help manage swallowing and breathing problems. PROGNOSIS  This syndrome usually results in the patient's death. This happens 7 to 10 years after diagnosis. Common causes of death include:  Aspiration. This is inhaling fluid into the lungs.  Stridor. This causes high-pitched breathing sounds due to airway obstruction.  Cardiac arrest. Document Released: 06/22/2002 Document Revised: 09/24/2011 Document Reviewed: 07/02/2005 [facility] Patient Information 2014 [place].

## 2013-11-25 ENCOUNTER — Telehealth: Payer: Self-pay | Admitting: Neurology

## 2013-11-25 NOTE — Telephone Encounter (Signed)
The Rx has already been faxed.  The pharmacy just saved it on the file and did not fill it, as it is available OTC (generic for Imodium).  They will fill Rx now, however, ins may not pay for it, since it is an OTC drug.  The cost should be relatively inexpensive.  I called the patient back, she is aware.

## 2013-11-25 NOTE — Telephone Encounter (Signed)
Pt called states Dr. Brett Fairy was going to call her in something for her stomach. I see that there is a prescription for loperamide (HM LOPERAMIDE HCL) 2 MG capsule, but hasn't been sent to pharmacy. Pt states she is really sick and how soon can this be called in? Please call pt concerning this matter. Thanks

## 2014-02-03 ENCOUNTER — Ambulatory Visit: Payer: Medicare PPO | Admitting: Podiatry

## 2014-02-08 ENCOUNTER — Ambulatory Visit (INDEPENDENT_AMBULATORY_CARE_PROVIDER_SITE_OTHER): Payer: Medicare PPO | Admitting: Podiatry

## 2014-02-08 ENCOUNTER — Encounter: Payer: Self-pay | Admitting: Podiatry

## 2014-02-08 VITALS — BP 116/71 | HR 68 | Resp 16 | Ht 66.0 in | Wt 175.0 lb

## 2014-02-08 DIAGNOSIS — L84 Corns and callosities: Secondary | ICD-10-CM | POA: Diagnosis not present

## 2014-02-08 NOTE — Progress Notes (Signed)
   Subjective:    Patient ID: Melinda [name], female    DOB: Jan 13, 1941, 73 y.o.   MRN: [id number]  HPI Comments: N callouses L B/L 1st and 2th MPJ plantar D and O long-term C hard painful skin A weightbearing T hx of TFC debridement  This patient presents again for painful plantar keratoses bilaterally. The last visit further a similar problem was 11/20/2011    Review of Systems  All other systems reviewed and are negative.      Objective:   Physical Exam Orientated x3 white female  Vascular: DP and PT pulses 2/4 bilaterally  Neurological: Deferred  Dermatological: Plantar keratoses lesser MPJ is bilaterally  Musculoskeletal: Hammertoe deformities 2-5 bilaterally       Assessment & Plan:   Assessment: Plantar keratoses bilaterally Hammertoe deformities bilaterally Metatarsalgia bilaterally  Plan: Plan keratoses are debrided Felt metatarsal pads dispensed for patient to apply to existing shoes. Felt metatarsal pads attached to plantar MPJ is bilaterally If felt metatarsal pads were beneficial I would dispense  additional pads  to attach to her shoes

## 2014-02-09 ENCOUNTER — Encounter: Payer: Self-pay | Admitting: Podiatry

## 2014-02-15 ENCOUNTER — Ambulatory Visit: Payer: Medicare PPO | Admitting: Podiatry

## 2014-02-16 DIAGNOSIS — B351 Tinea unguium: Secondary | ICD-10-CM

## 2014-03-03 ENCOUNTER — Telehealth: Payer: Self-pay | Admitting: Neurology

## 2014-03-03 DIAGNOSIS — G232 Striatonigral degeneration: Secondary | ICD-10-CM

## 2014-03-03 NOTE — Telephone Encounter (Signed)
Patient calling to state that she has moved to [place], [place] and would like a referral sent to Dr. Dicie Beam, a neurologist there. The referral can be faxed to [phone].

## 2014-03-04 NOTE — Telephone Encounter (Signed)
Patient has moved to [place], [place] and would like a referral to be sent to Dr. Dicie Beam, a neurologist to take over her care there.  The referral can be faxed to [phone].

## 2014-03-04 NOTE — Telephone Encounter (Signed)
amb referral put into system. Should be able to go directly to Dicie Beam, MD.  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

## 2014-03-05 NOTE — Telephone Encounter (Signed)
The referral was routed to Dr. Rosine Beat in the Epic system and a voice message was left for the patient letting her know.

## 2014-03-29 ENCOUNTER — Ambulatory Visit: Payer: Medicare PPO | Admitting: Adult Health

## 2014-04-16 ENCOUNTER — Telehealth: Payer: Self-pay | Admitting: Cardiology

## 2014-04-16 NOTE — Telephone Encounter (Signed)
New message    patient calling wants to discuss medication flecainide 100 mg effect to parkinson disease.     [phone]. [facility]

## 2014-04-19 NOTE — Telephone Encounter (Signed)
Returned patient's call, no voicemail to leave message.

## 2014-04-20 NOTE — Telephone Encounter (Signed)
Left message on voicemail for pt to call back to discuss

## 2014-04-21 NOTE — Telephone Encounter (Signed)
Spoke with pt who reports that Dr Dicie Beam at [facility] told her that Flecainide can cause some people to feel like or demonstrate s/s of Parkinson's disease.  Advised pt that I reviewed with our pharmacist, Elberta Leatherwood who advises this is not the case with this medication but other antiarrythmics may cause some gait disorders and that sort of thing.  Advised I will forward to Dr Marlou Porch for his review and recommendations.

## 2014-05-05 NOTE — Telephone Encounter (Signed)
I reviewed with pharmacy here in hospital as well and this would be an exceptionally rare side of flecainide. There are case reports of movement disorders but they are quite rare.   One option would be to stop flecainide 100 BID and only take flecainide 100mg  on as needed basis, i.e if atrial arrhythmia returns.   If symptoms worsen, ? option for ablation procedure.   Candee Furbish, MD

## 2014-05-06 NOTE — Telephone Encounter (Signed)
Spoke with patient in detail about the medication and its side effects.  She was instructed on Dr Marlou Porch recommendations and is going to try stopping the flecainide to see if she s/s improve.  She will take it prn and call back if any issues.

## 2014-05-06 NOTE — Telephone Encounter (Signed)
Follow up ° ° ° ° °Returning Melinda [name]'s call °

## 2014-05-06 NOTE — Telephone Encounter (Signed)
Left message to call back to discuss flecainide

## 2014-06-06 ENCOUNTER — Other Ambulatory Visit: Payer: Self-pay | Admitting: Cardiology

## 2014-06-24 ENCOUNTER — Encounter (HOSPITAL_COMMUNITY): Payer: Self-pay | Admitting: Cardiology

## 2014-09-06 ENCOUNTER — Other Ambulatory Visit: Payer: Self-pay | Admitting: Cardiology

## 2015-01-10 ENCOUNTER — Other Ambulatory Visit: Payer: Self-pay

## 2015-02-10 ENCOUNTER — Encounter: Payer: Self-pay | Admitting: Gastroenterology

## 2023-11-14 DEATH — deceased
